# Patient Record
Sex: Female | Born: 1955 | Race: Black or African American | Hispanic: No | Smoking: Current every day smoker
Health system: Southern US, Community
[De-identification: ages and names within clinical notes are randomized; demographics above are authoritative.]

---

## 2019-11-18 ENCOUNTER — Encounter (HOSPITAL_COMMUNITY): Payer: Self-pay | Admitting: Student

## 2019-11-18 ENCOUNTER — Inpatient Hospital Stay (HOSPITAL_COMMUNITY)
Admission: EM | Admit: 2019-11-18 | Discharge: 2019-12-02 | DRG: 020 | Disposition: A | Discharge status: Rehabilitation Facility | Payer: Self-pay | Attending: Neurosurgery | Admitting: Neurosurgery

## 2019-11-18 ENCOUNTER — Emergency Department (HOSPITAL_COMMUNITY): Payer: Self-pay

## 2019-11-18 DIAGNOSIS — Z978 Presence of other specified devices: Secondary | ICD-10-CM

## 2019-11-18 DIAGNOSIS — R471 Dysarthria and anarthria: Secondary | ICD-10-CM | POA: Diagnosis present

## 2019-11-18 DIAGNOSIS — R29810 Facial weakness: Secondary | ICD-10-CM | POA: Diagnosis present

## 2019-11-18 DIAGNOSIS — E874 Mixed disorder of acid-base balance: Secondary | ICD-10-CM | POA: Diagnosis present

## 2019-11-18 DIAGNOSIS — Z2989 Encounter for other specified prophylactic measures: Secondary | ICD-10-CM

## 2019-11-18 DIAGNOSIS — G8194 Hemiplegia, unspecified affecting left nondominant side: Secondary | ICD-10-CM | POA: Diagnosis present

## 2019-11-18 DIAGNOSIS — G936 Cerebral edema: Secondary | ICD-10-CM

## 2019-11-18 DIAGNOSIS — I720 Aneurysm of carotid artery: Secondary | ICD-10-CM | POA: Diagnosis present

## 2019-11-18 DIAGNOSIS — I10 Essential (primary) hypertension: Secondary | ICD-10-CM

## 2019-11-18 DIAGNOSIS — Z9289 Personal history of other medical treatment: Secondary | ICD-10-CM

## 2019-11-18 DIAGNOSIS — R1310 Dysphagia, unspecified: Secondary | ICD-10-CM | POA: Diagnosis present

## 2019-11-18 DIAGNOSIS — J9690 Respiratory failure, unspecified, unspecified whether with hypoxia or hypercapnia: Secondary | ICD-10-CM

## 2019-11-18 DIAGNOSIS — R414 Neurologic neglect syndrome: Secondary | ICD-10-CM | POA: Diagnosis present

## 2019-11-18 DIAGNOSIS — D72829 Elevated white blood cell count, unspecified: Secondary | ICD-10-CM

## 2019-11-18 DIAGNOSIS — G9349 Other encephalopathy: Secondary | ICD-10-CM | POA: Diagnosis present

## 2019-11-18 DIAGNOSIS — Z298 Encounter for other specified prophylactic measures: Secondary | ICD-10-CM

## 2019-11-18 DIAGNOSIS — E785 Hyperlipidemia, unspecified: Secondary | ICD-10-CM | POA: Diagnosis present

## 2019-11-18 DIAGNOSIS — S066XAA Traumatic subarachnoid hemorrhage with loss of consciousness status unknown, initial encounter: Secondary | ICD-10-CM | POA: Diagnosis present

## 2019-11-18 DIAGNOSIS — I609 Nontraumatic subarachnoid hemorrhage, unspecified: Secondary | ICD-10-CM

## 2019-11-18 DIAGNOSIS — I69391 Dysphagia following cerebral infarction: Secondary | ICD-10-CM

## 2019-11-18 DIAGNOSIS — G96198 Other disorders of meninges, not elsewhere classified: Secondary | ICD-10-CM | POA: Diagnosis present

## 2019-11-18 DIAGNOSIS — I69354 Hemiplegia and hemiparesis following cerebral infarction affecting left non-dominant side: Secondary | ICD-10-CM

## 2019-11-18 DIAGNOSIS — E876 Hypokalemia: Secondary | ICD-10-CM

## 2019-11-18 DIAGNOSIS — R569 Unspecified convulsions: Secondary | ICD-10-CM

## 2019-11-18 DIAGNOSIS — D7589 Other specified diseases of blood and blood-forming organs: Secondary | ICD-10-CM

## 2019-11-18 DIAGNOSIS — R0682 Tachypnea, not elsewhere classified: Secondary | ICD-10-CM

## 2019-11-18 DIAGNOSIS — R29734 NIHSS score 34: Secondary | ICD-10-CM | POA: Diagnosis present

## 2019-11-18 DIAGNOSIS — R000 Tachycardia, unspecified: Secondary | ICD-10-CM

## 2019-11-18 DIAGNOSIS — S066X9A Traumatic subarachnoid hemorrhage with loss of consciousness of unspecified duration, initial encounter: Secondary | ICD-10-CM | POA: Diagnosis present

## 2019-11-18 DIAGNOSIS — G9340 Encephalopathy, unspecified: Secondary | ICD-10-CM

## 2019-11-18 DIAGNOSIS — I6001 Nontraumatic subarachnoid hemorrhage from right carotid siphon and bifurcation: Principal | ICD-10-CM | POA: Diagnosis present

## 2019-11-18 DIAGNOSIS — Z20822 Contact with and (suspected) exposure to covid-19: Secondary | ICD-10-CM | POA: Diagnosis present

## 2019-11-18 DIAGNOSIS — Z4659 Encounter for fitting and adjustment of other gastrointestinal appliance and device: Secondary | ICD-10-CM

## 2019-11-18 DIAGNOSIS — R32 Unspecified urinary incontinence: Secondary | ICD-10-CM | POA: Diagnosis present

## 2019-11-18 LAB — APTT: aPTT: 24 seconds (ref 24–36)

## 2019-11-18 LAB — CBC
HCT: 43.3 % (ref 36.0–46.0)
HCT: 43.4 % (ref 36.0–46.0)
Hemoglobin: 14.5 g/dL (ref 12.0–15.0)
Hemoglobin: 14.7 g/dL (ref 12.0–15.0)
MCH: 34.5 pg — ABNORMAL HIGH (ref 26.0–34.0)
MCH: 33.9 pg (ref 26.0–34.0)
MCHC: 33.5 g/dL (ref 30.0–36.0)
MCHC: 33.9 g/dL (ref 30.0–36.0)
MCV: 103.1 fL — ABNORMAL HIGH (ref 80.0–100.0)
MCV: 100.2 fL — ABNORMAL HIGH (ref 80.0–100.0)
Platelets: 236 10*3/uL (ref 150–400)
Platelets: 234 10*3/uL (ref 150–400)
RBC: 4.2 MIL/uL (ref 3.87–5.11)
RBC: 4.33 MIL/uL (ref 3.87–5.11)
RDW: 14.6 % (ref 11.5–15.5)
RDW: 14.5 % (ref 11.5–15.5)
WBC: 11.6 10*3/uL — ABNORMAL HIGH (ref 4.0–10.5)
WBC: 13.4 10*3/uL — ABNORMAL HIGH (ref 4.0–10.5)
nRBC: 0 % (ref 0.0–0.2)
nRBC: 0 % (ref 0.0–0.2)

## 2019-11-18 LAB — BASIC METABOLIC PANEL
Anion gap: 16 — ABNORMAL HIGH (ref 5–15)
BUN: 13 mg/dL (ref 8–23)
CO2: 17 mmol/L — ABNORMAL LOW (ref 22–32)
Calcium: 9.6 mg/dL (ref 8.9–10.3)
Chloride: 107 mmol/L (ref 98–111)
Creatinine, Ser: 0.83 mg/dL (ref 0.44–1.00)
GFR calc Af Amer: >60 mL/min (ref >60)
GFR calc non Af Amer: >60 mL/min (ref >60)
Glucose, Bld: 112 mg/dL — ABNORMAL HIGH (ref 70–99)
Potassium: 4.7 mmol/L (ref 3.5–5.1)
Sodium: 140 mmol/L (ref 135–145)

## 2019-11-18 LAB — POCT I-STAT 7, (LYTES, BLD GAS, ICA,H+H)
Acid-Base Excess: 2 mmol/L (ref 0.0–2.0)
Bicarbonate: 25.4 mmol/L (ref 20.0–28.0)
Calcium, Ion: 1.22 mmol/L (ref 1.15–1.40)
HCT: 43 % (ref 36.0–46.0)
Hemoglobin: 14.6 g/dL (ref 12.0–15.0)
O2 Saturation: 100 %
Patient temperature: 98.6
Potassium: 3.4 mmol/L — ABNORMAL LOW (ref 3.5–5.1)
Sodium: 139 mmol/L (ref 135–145)
TCO2: 26 mmol/L (ref 22–32)
pCO2 arterial: 33.8 mmHg (ref 32.0–48.0)
pH, Arterial: 7.484 — ABNORMAL HIGH (ref 7.350–7.450)
pO2, Arterial: 315 mmHg — ABNORMAL HIGH (ref 83.0–108.0)

## 2019-11-18 LAB — RESPIRATORY PANEL BY RT PCR (FLU A&B, COVID)
Influenza A by PCR: NEGATIVE
Influenza B by PCR: NEGATIVE
SARS Coronavirus 2 by RT PCR: NEGATIVE

## 2019-11-18 LAB — DIFFERENTIAL
Abs Immature Granulocytes: 0.15 K/uL — ABNORMAL HIGH (ref 0.00–0.07)
Basophils Absolute: 0 K/uL (ref 0.0–0.1)
Basophils Relative: 0 %
Eosinophils Absolute: 0 K/uL (ref 0.0–0.5)
Eosinophils Relative: 0 %
Immature Granulocytes: 1 %
Lymphocytes Relative: 9 %
Lymphs Abs: 1.2 K/uL (ref 0.7–4.0)
Monocytes Absolute: 0.7 K/uL (ref 0.1–1.0)
Monocytes Relative: 5 %
Neutro Abs: 11.3 K/uL — ABNORMAL HIGH (ref 1.7–7.7)
Neutrophils Relative %: 85 %

## 2019-11-18 LAB — I-STAT CHEM 8, ED
BUN: 17 mg/dL (ref 8–23)
Calcium, Ion: 1.12 mmol/L — ABNORMAL LOW (ref 1.15–1.40)
Chloride: 109 mmol/L (ref 98–111)
Creatinine, Ser: 0.5 mg/dL (ref 0.44–1.00)
Glucose, Bld: 113 mg/dL — ABNORMAL HIGH (ref 70–99)
HCT: 48 % — ABNORMAL HIGH (ref 36.0–46.0)
Hemoglobin: 16.3 g/dL — ABNORMAL HIGH (ref 12.0–15.0)
Potassium: 4.3 mmol/L (ref 3.5–5.1)
Sodium: 140 mmol/L (ref 135–145)
TCO2: 20 mmol/L — ABNORMAL LOW (ref 22–32)

## 2019-11-18 LAB — PROTIME-INR
INR: 1.1 (ref 0.8–1.2)
Prothrombin Time: 14 seconds (ref 11.4–15.2)

## 2019-11-18 LAB — HIV ANTIBODY (ROUTINE TESTING W REFLEX): HIV Screen 4th Generation wRfx: NONREACTIVE

## 2019-11-18 LAB — CBG MONITORING, ED: Glucose-Capillary: 107 mg/dL — ABNORMAL HIGH (ref 70–99)

## 2019-11-18 LAB — GLUCOSE, CAPILLARY: Glucose-Capillary: 105 mg/dL — ABNORMAL HIGH (ref 70–99)

## 2019-11-18 LAB — ETHANOL: Alcohol, Ethyl (B): <10 mg/dL (ref <10)

## 2019-11-18 LAB — SALICYLATE LEVEL: Salicylate Lvl: <7 mg/dL — ABNORMAL LOW (ref 7.0–30.0)

## 2019-11-18 LAB — ACETAMINOPHEN LEVEL: Acetaminophen (Tylenol), Serum: <10 ug/mL — ABNORMAL LOW (ref 10–30)

## 2019-11-18 MED ORDER — FENTANYL 2500MCG IN NS 250ML (10MCG/ML) PREMIX INFUSION
25.0000 ug/h | INTRAVENOUS | Status: DC
  Administered 2019-11-18: 100 ug/h via INTRAVENOUS
  Filled 2019-11-18: qty 250

## 2019-11-18 MED ORDER — PROPOFOL 1000 MG/100ML IV EMUL
5.0000 ug/kg/min | INTRAVENOUS | Status: DC
  Administered 2019-11-18: 23:00:00 50 ug/kg/min via INTRAVENOUS

## 2019-11-18 MED ORDER — PROPOFOL 1000 MG/100ML IV EMUL
INTRAVENOUS | Status: AC
  Administered 2019-11-18: 50 ug/kg/min via INTRAVENOUS
  Filled 2019-11-18: qty 100

## 2019-11-18 MED ORDER — PANTOPRAZOLE SODIUM 40 MG PO PACK
40.0000 mg | PACK | Freq: Every day | ORAL | Status: DC
  Administered 2019-11-19 – 2019-12-01 (×14): 40 mg
  Filled 2019-11-18 (×15): qty 20

## 2019-11-18 MED ORDER — FENTANYL CITRATE (PF) 100 MCG/2ML IJ SOLN
INTRAMUSCULAR | Status: AC
  Filled 2019-11-18: qty 2

## 2019-11-18 MED ORDER — PROPOFOL 1000 MG/100ML IV EMUL
5.0000 ug/kg/min | INTRAVENOUS | Status: DC
  Administered 2019-11-18 – 2019-11-19 (×2): 50 ug/kg/min via INTRAVENOUS
  Filled 2019-11-18: qty 100

## 2019-11-18 MED ORDER — ACETAMINOPHEN 650 MG RE SUPP: 650.0000 mg | RECTAL | Status: DC | PRN

## 2019-11-18 MED ORDER — LEVETIRACETAM IN NACL 1000 MG/100ML IV SOLN
1000.0000 mg | Freq: Once | INTRAVENOUS | Status: DC
  Filled 2019-11-18: qty 100

## 2019-11-18 MED ORDER — DOCUSATE SODIUM 100 MG PO CAPS: 100.0000 mg | ORAL_CAPSULE | Freq: Two times a day (BID) | ORAL | Status: DC

## 2019-11-18 MED ORDER — ETOMIDATE 2 MG/ML IV SOLN
INTRAVENOUS | Status: AC | PRN
  Administered 2019-11-18: 20 mg via INTRAVENOUS

## 2019-11-18 MED ORDER — FENTANYL BOLUS VIA INFUSION
50.0000 ug | INTRAVENOUS | Status: DC | PRN
  Administered 2019-11-18: 50 ug via INTRAVENOUS
  Filled 2019-11-18: qty 50

## 2019-11-18 MED ORDER — LORAZEPAM 2 MG/ML IJ SOLN
INTRAMUSCULAR | Status: AC
  Filled 2019-11-18: qty 1

## 2019-11-18 MED ORDER — LEVETIRACETAM IN NACL 500 MG/100ML IV SOLN: 500.0000 mg | Freq: Two times a day (BID) | INTRAVENOUS | Status: DC

## 2019-11-18 MED ORDER — CLEVIDIPINE BUTYRATE 0.5 MG/ML IV EMUL
0.0000 mg/h | INTRAVENOUS | Status: DC
  Administered 2019-11-18: 21:00:00 1 mg/h via INTRAVENOUS
  Filled 2019-11-18: qty 50

## 2019-11-18 MED ORDER — ORAL CARE MOUTH RINSE
15.0000 mL | OROMUCOSAL | Status: DC
  Administered 2019-11-18 – 2019-11-21 (×22): 15 mL via OROMUCOSAL

## 2019-11-18 MED ORDER — NIMODIPINE 30 MG PO CAPS
60.0000 mg | ORAL_CAPSULE | ORAL | Status: DC
  Filled 2019-11-18 (×3): qty 2

## 2019-11-18 MED ORDER — ACETAMINOPHEN 325 MG PO TABS
650.0000 mg | ORAL_TABLET | ORAL | Status: DC | PRN
  Filled 2019-11-18: qty 2

## 2019-11-18 MED ORDER — LORAZEPAM 2 MG/ML IJ SOLN
INTRAMUSCULAR | Status: AC
  Administered 2019-11-18: 2 mg
  Filled 2019-11-18: qty 1

## 2019-11-18 MED ORDER — ACETAMINOPHEN 160 MG/5ML PO SOLN
650.0000 mg | ORAL | Status: DC | PRN
  Administered 2019-11-22 – 2019-11-30 (×11): 650 mg
  Filled 2019-11-18 (×12): qty 20.3

## 2019-11-18 MED ORDER — SUCCINYLCHOLINE CHLORIDE 20 MG/ML IJ SOLN
INTRAMUSCULAR | Status: AC | PRN
  Administered 2019-11-18: 100 mg via INTRAVENOUS

## 2019-11-18 MED ORDER — CLEVIDIPINE BUTYRATE 0.5 MG/ML IV EMUL: 0.0000 mg/h | INTRAVENOUS | Status: DC

## 2019-11-18 MED ORDER — ONDANSETRON HCL 4 MG/2ML IJ SOLN
4.0000 mg | Freq: Four times a day (QID) | INTRAMUSCULAR | Status: DC | PRN
  Administered 2019-11-28: 4 mg via INTRAVENOUS
  Filled 2019-11-18: qty 2

## 2019-11-18 MED ORDER — ROCURONIUM BROMIDE 50 MG/5ML IV SOLN
INTRAVENOUS | Status: AC | PRN
  Administered 2019-11-18: 40 mg via INTRAVENOUS

## 2019-11-18 MED ORDER — LEVETIRACETAM IN NACL 1000 MG/100ML IV SOLN
1000.0000 mg | Freq: Two times a day (BID) | INTRAVENOUS | Status: DC
  Administered 2019-11-19 – 2019-11-24 (×12): 1000 mg via INTRAVENOUS
  Filled 2019-11-18 (×13): qty 100

## 2019-11-18 MED ORDER — PANTOPRAZOLE SODIUM 40 MG PO TBEC: 40.0000 mg | DELAYED_RELEASE_TABLET | Freq: Every day | ORAL | Status: DC

## 2019-11-18 MED ORDER — ONDANSETRON 4 MG PO TBDP: 4.0000 mg | ORAL_TABLET | Freq: Four times a day (QID) | ORAL | Status: DC | PRN

## 2019-11-18 MED ORDER — SODIUM CHLORIDE 0.9 % IV SOLN
2000.0000 mg | Freq: Once | INTRAVENOUS | Status: AC
  Administered 2019-11-18: 2000 mg via INTRAVENOUS
  Filled 2019-11-18: qty 20

## 2019-11-18 MED ORDER — STROKE: EARLY STAGES OF RECOVERY BOOK
Freq: Once | Status: AC
  Filled 2019-11-18: qty 1

## 2019-11-18 MED ORDER — CHLORHEXIDINE GLUCONATE CLOTH 2 % EX PADS
6.0000 | MEDICATED_PAD | Freq: Every day | CUTANEOUS | Status: DC
  Administered 2019-11-19 – 2019-12-01 (×11): 6 via TOPICAL

## 2019-11-18 MED ORDER — SODIUM CHLORIDE 0.9 % IV SOLN: INTRAVENOUS | Status: DC

## 2019-11-18 MED ORDER — NIMODIPINE 6 MG/ML PO SOLN
60.0000 mg | ORAL | Status: DC
  Administered 2019-11-19 – 2019-12-01 (×71): 60 mg
  Filled 2019-11-18 (×76): qty 10

## 2019-11-18 MED ORDER — IOHEXOL 350 MG/ML SOLN
75.0000 mL | Freq: Once | INTRAVENOUS | Status: AC | PRN
  Administered 2019-11-18: 75 mL via INTRAVENOUS

## 2019-11-18 MED ORDER — CHLORHEXIDINE GLUCONATE 0.12% ORAL RINSE (MEDLINE KIT)
15.0000 mL | Freq: Two times a day (BID) | OROMUCOSAL | Status: DC
  Administered 2019-11-18 – 2019-11-21 (×6): 15 mL via OROMUCOSAL

## 2019-11-18 NOTE — ED Notes (Signed)
Requested EDP and attending to put in order for propofol

## 2019-11-18 NOTE — ED Provider Notes (Signed)
Medical screening examination/treatment/procedure(s) were conducted as a shared visit with non-physician practitioner(s) and myself.  I personally evaluated the patient during the encounter.      Procedure Name: Intubation Date/Time: 11/18/2019 8:44 PM Performed by: Arby Barrette, MD Pre-anesthesia Checklist: Patient identified, Patient being monitored, Emergency Drugs available and Suction available Oxygen Delivery Method: Ambu bag Preoxygenation: Pre-oxygenation with 100% oxygen Induction Type: Rapid sequence Ventilation: Mask ventilation without difficulty Laryngoscope Size: Glidescope and 3 Grade View: Grade I Tube size: 7.5 mm Number of attempts: 1 Placement Confirmation: ETT inserted through vocal cords under direct vision,  CO2 detector and Breath sounds checked- equal and bilateral Secured at: 23 cm Tube secured with: ETT holder Comments: Uncomplicated intubation under direct visualization.  Oxygen saturation remained 100%.  After the patient being repositioned in the bed and portable x-ray reviewed by myself, ET tube was at 26 cm and portable chest x-ray showed it just at the carina.  Tube withdrawn to 22 cm at the lip.     Angiocath insertion Performed by: Arby Barrette  Consent: Verbal consent obtained. Risks and benefits: risks, benefits and alternatives were discussed Time out: Immediately prior to procedure a "time out" was called to verify the correct patient, procedure, equipment, support staff and site/side marked as required.  Preparation: Patient was prepped and draped in the usual sterile fashion.  Vein Location:left EJ  No Ultrasound Guided  Gauge:20 long  Normal blood return and flush without difficulty Patient tolerance: Patient tolerated the procedure well with no immediate complications.  OROGASTRIC TUBE PLACEMENT: Gastric tube placed by myself.  No resistance.  Confirmed by air installation.   Arby Barrette, MD 11/18/19 2048

## 2019-11-18 NOTE — Progress Notes (Signed)
RN updated husband Mithra Spano, son Nestor Ramp, and daughter Audie Clear on patient's current status. Family states they are from [place], [place]. RN informed family of Dr. Val Riles plan for surgery in the morning and that he will contact them to provide further details. Patient currently stable. RN will continue to monitor.

## 2019-11-18 NOTE — Code Documentation (Signed)
Responded to Code Stroke on pt already in ED. Code stroke called at 1920. Per family, pt c/o headache during the day, laid down for a nap, woke up and had a seizure. Family took pt to fire department and then pt taken to ED via EMS. Pt had another seizure en route and was given versed. Per nursing, pt arrived somnolent but with L sided weakness. Code Stroke was then initiated. Pt became increasingly agitated once in CT and had to be taken back to room and emergently intubated. NIH was unable to be completed prior to intubation. NIH post intubation 34. Pt taken to CT which showed SAH. 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 The aneurysm is 2 to 3 mm proximal to the ICA bifurcation. There is a 2 mm infundibulum or small PCOM aneurysm just proximal to that." Neurosurgery consulted by neurology and pt taken back to room. There, ED RN assisted with starting cleviprex to keep SBP<140, fentanyl gtt started as well, and 2 g keppra given. Plan ICU admit.

## 2019-11-18 NOTE — Progress Notes (Signed)
Admission history and CT/CTA personally reviewed. 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 While the aneurysm may be coilable, I suspect with this morphology clipping will offer better durability of treatment with similar risk profile. Will plan on surgical clipping in am. Pt already evaluated by my partner, Dr. Yetta Barre and to be admitted to ICU overnight. Will d/w family in am prior to surgery.

## 2019-11-18 NOTE — Consult Note (Signed)
NEURO HOSPITALIST CONSULT NOTE   Requestig physician: Dr. Donnald Garre  Reason for Consult: Headache followed by seizures.   History obtained from: EMS and Chart     HPI:                                                                                                                                          Olivia Mann is an 64 y.o. female who presents to the [facility] ED via EMS with seizure. She was traveling with family when she had a seizure lasting approximately 5 minutes. She experienced bladder incontinence with the seizure. LKN per report prior to the was 5:45 PM; however, she had had a headache since 11 AM today. The family stopped at a local fire station where patient was noted to be confused and postictal. EMS was called and during transport, she had a GTC seizure lasting 30-60 seconds. She was given 5 mg IM midazolam. On arrival to the ED, she was somnolent. She was able to maintain 100% saturations on RA in the ED. She was noted to be moving her left side less than her right by staff in the ED. When attempting to obtain CT head, the patient would become agitated whenever she was positioned supine.   Dr. Wilford Corner evaluated the patient and noted the following: "Very drowsy and lethargic. Opens eyes to noxious stimulation. Moves all extremities to noxious stimulation and then becomes severely agitated and starts thrashing around. Pupils are 3 mm round reactive light. No gaze preference.  Oculocephalics present. No focal weakness observed."  Subarachnoid hemorrhage was on the DDx due to headache since 11 AM, followed by the seizures. Benefits of obtaining CT under sedation for emergently essential diagnositic information were determined by Dr. Wilford Corner, Dr. Donnald Garre and myself to outweigh risks.   Per daughter, the patient had been complaining of a "real bad headache". She had had a mild headache yesterday that she an ASA for, just prior to going to bed. On awakening she said she did not  feel well, had a slight headache that then eased up. Had no confusion or drowsiness. Then started traveling in the car with family. She then took an ASA or an Advil and took a nap in the car at about noon. When she got up at 1:30 PM and vomited, then went back to sleep. She woke up again at 5:15 and had the seizure. Family then drove her to the fire department.    PMHx: (obtained from daughter) No past medical history  SocHx: Does not use drugs. Drinks about 2 glasses of wine per day, 6 oz each  FHx: Aneurysms in brother and sister  All: Not on file  Meds No medications per daughter            ROS:  Unable to obtain due to AMS following seizure and benzodiazepine administration.    Blood pressure 135/85, pulse (!) 108, temperature (!) 96.9 F (36.1 C), temperature source Axillary, resp. rate (!) 25, SpO2 100 %.   General Examination:                                                                                                       Physical Exam  HEENT-  [place]/AT. No nuchal rigidity in rotation or flexion/extension   Lungs- Respirations unlabored Extremities- Warm and well perfused  Neurological Examination Mental Status: Obtunded/sedated. Not following any commands. Does not open eyes to stimulation. Nonverbal. Becomes agitated and thrashes all 4 extremities, right more than left, when team attempts to place in supine position.  Cranial Nerves: II: Does not gaze towards or away from visual stimuli.  PERRL 3 mm >> 2 mm.    III,IV, VI: Eyes conjugately at the midline at rest. Intact doll's eye reflex. No nystagmus.  V,VII: Face flaccidly symmetric. Does not react to tactile stimulation. VIII: No responses to auditory stimuli IX,X: Unable to visualize palate XI: Unable to assess SCM or trapezius strength XII: Unable to assess Motor: To noxious,  thrashes RUE and RLE with full strength. Thrashes LUE and LLE but less vigorously than on the right.  Sensory: Reacts to pinch and noxious plantar stimulation in upper and lower extremities bilaterally.  Deep Tendon Reflexes: Deferred in the context of imminent intubation.  Plantars: Equivocal bilaterally  Cerebellar/Gait: Unable to assess   Lab Results: Basic Metabolic Panel: Recent Labs  Lab 11/18/19 1857  NA 140  K 4.3  CL 109  GLUCOSE 113*  BUN 17  CREATININE 0.50    CBC: Recent Labs  Lab 11/18/19 1827 11/18/19 1857  WBC 11.6*  --   HGB 14.5 16.3*  HCT 43.3 48.0*  MCV 103.1*  --   PLT 236  --     Cardiac Enzymes: No results for input(s): CKTOTAL, CKMB, CKMBINDEX, TROPONINI in the last 168 hours.  Lipid Panel: No results for input(s): CHOL, TRIG, HDL, CHOLHDL, VLDL, LDLCALC in the last 168 hours.  Imaging:  CT head: Diffuse subarachnoid hemorrhage. 2.2 x 2.4 x 2.9 cm intraparenchymal hematoma in the right anteromedial temporal lobe. Intraventricular communication on the right. This could be a primary intraparenchymal hemorrhage with subarachnoid and intraventricular penetration or the pattern could also indicate a ruptured aneurysm with an associated intraparenchymal hematoma.   Assessment: 64 year old female presenting with headache and new-onset seizures.  1. Exam after sedation with midazolam reveals obtundation and decreased movement on the left relative to the right.  2. CT head shows extensive supratentorial subarachnoid hemorrhage with an infratentorial component.  3. 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 There is a smaller second right supraclinoid aneurysm 3 mm diameter, slightly more proximally.   Recommendations: 1. STAT Neurosurgery consult. I have discussed the case with Neurosurgery. They will evaluate the patient.  2. BPs in the 120s currently. Ordering a titratable clevidipine drip for use if SBP  goes  above 140.  3. May need triple-H therapy for prevention of vasospasm after clipping or coiling of the aneurysm (Hypertension, Hypervolemia, and Hemodilution). Also may need to start scheduled nimodipine via NGT. Will defer to Neurosurgery.  4. Load Keppra 2000 mg IV x 1 then start 1000 mg IV BID (ordered).   65 minutes spent in the emergent Neurological evaluation and management of this critically ill patient. Time spent included discussion with the family over the telephone, CT review and coordination of care.   Electronically signed: Dr. Caryl Pina 11/18/2019, 7:32 PM

## 2019-11-18 NOTE — ED Notes (Signed)
Removed NRB. Pt maintaining 100% on RA and sleeping

## 2019-11-18 NOTE — ED Provider Notes (Addendum)
[facility] EMERGENCY DEPARTMENT Provider Note   CSN: [id number] Arrival date & time: 11/18/19  1755   History Chief Complaint  Patient presents with   Seizures   Ahmaya Ostermiller is a 64 y.o. female presents with EMS for seizures x 2.  No hx seizures, 2 x seizures given Versed by EMS. Family hx of brain aneurysm.  Daughter states complaints of HA today. No Drug use. No chronic medical problems. Does not take medications on a normal basis per daughter. Family on way.  States she was not complaining of any chest pain, shortness of breath.  Family states she did take some ibuprofen with her headache had one episode of emesis went back to sleep and then had a seizure.  Rowe Clack (Daughter) [phone]     Level 5 Caveat- AMS  HPI     History reviewed. No pertinent past medical history.  Patient Active Problem List   Diagnosis Date Noted   Subarachnoid hematoma (HCC) 11/18/2019    History reviewed   OB History   No obstetric history on file.     History reviewed. No pertinent family history.  Social History   Tobacco Use   Smoking status: Not on file  Substance Use Topics   Alcohol use: Not on file   Drug use: Not on file    Home Medications Prior to Admission medications   Not on File    Allergies    Patient has no known allergies.  Review of Systems   Review of Systems  Unable to perform ROS: Acuity of condition    Physical Exam Updated Vital Signs BP (!) 146/96    Pulse 94    Temp (!) 96.9 F (36.1 C) (Axillary)    Resp 16    SpO2 100%   Physical Exam Constitutional:      Appearance: She is not toxic-appearing or diaphoretic.     Comments: Obtunded  HENT:     Nose: Nose normal.     Mouth/Throat:     Mouth: Mucous membranes are moist.  Eyes:     Comments: 45mm round, slow to react  Neck:     Comments: No rigidity Cardiovascular:     Rate and Rhythm: Tachycardia present.     Pulses: Normal pulses.     Heart sounds:  Normal heart sounds.  Pulmonary:     Breath sounds: Normal breath sounds.  Abdominal:     General: Bowel sounds are normal. There is no distension.     Tenderness: There is no abdominal tenderness.  Musculoskeletal:     Comments: Moves right side freely. No movement to LU, LL extremity.  Shoulders bilaterally appear in socket to palpation.  Skin:    Comments: Brisk cap refill  Neurological:     Mental Status: She is unresponsive.     Comments: Pupils 4 mm, slow to react.  Patient with left-sided neglect. Patient attended, unable to answer questions.  Will withdraw to pain to right side, not to left.    ED Results / Procedures / Treatments   Labs (all labs ordered are listed, but only abnormal results are displayed) Labs Reviewed  BASIC METABOLIC PANEL - Abnormal; Notable for the following components:      Result Value   CO2 17 (*)    Glucose, Bld 112 (*)    Anion gap 16 (*)    All other components within normal limits  CBC - Abnormal; Notable for the following components:   WBC 11.6 (*)  MCV 103.1 (*)    MCH 34.5 (*)    All other components within normal limits  CBG MONITORING, ED - Abnormal; Notable for the following components:   Glucose-Capillary 107 (*)    All other components within normal limits  I-STAT CHEM 8, ED - Abnormal; Notable for the following components:   Glucose, Bld 113 (*)    Calcium, Ion 1.12 (*)    TCO2 20 (*)    Hemoglobin 16.3 (*)    HCT 48.0 (*)    All other components within normal limits  RESPIRATORY PANEL BY RT PCR (FLU A&B, COVID)  PROTIME-INR  APTT  ETHANOL  SALICYLATE LEVEL  ACETAMINOPHEN LEVEL  DIFFERENTIAL  RAPID URINE DRUG SCREEN, HOSP PERFORMED  URINALYSIS, ROUTINE W REFLEX MICROSCOPIC  HIV ANTIBODY (ROUTINE TESTING W REFLEX)  CBC  PROTIME-INR  APTT  BASIC METABOLIC PANEL  I-STAT CHEM 8, ED    EKG None  Radiology CT ANGIO HEAD W OR WO CONTRAST  Result Date: 11/18/2019 CLINICAL DATA:  Intracranial hemorrhage. EXAM: CT  ANGIOGRAPHY HEAD AND NECK TECHNIQUE: Multidetector CT imaging of the head and neck was performed using the standard protocol during bolus administration of intravenous contrast. Multiplanar CT image reconstructions and MIPs were obtained to evaluate the vascular anatomy. Carotid stenosis measurements (when applicable) are obtained utilizing NASCET criteria, using the distal internal carotid diameter as the denominator. CONTRAST:  68mL OMNIPAQUE IOHEXOL 350 MG/ML SOLN COMPARISON:  None. FINDINGS: CTA NECK FINDINGS Aortic arch: Normal Right carotid system: Common carotid artery widely patent to the bifurcation. No bifurcation atherosclerotic disease. Cervical ICA is normal. Left carotid system: Normal Vertebral arteries: Normal Skeleton: Mid cervical spondylosis. Other neck: Normal.  Patient intubated. Upper chest: Normal Review of the MIP images confirms the above findings CTA HEAD FINDINGS Anterior circulation: Both internal carotid arteries are widely patent through the skull base and siphon regions. Projecting laterally from the supraclinoid ICA, 2 mm proximal to the bifurcation, there is a slightly lobular aneurysm measuring up to 7 mm. There appears to be a wide mouth measuring up to 2.5 mm. Just proximal to that, there is a 2 mm aneurysm or infundibulum. No aneurysm seen in the middle cerebral artery branches. Azygos anterior cerebral artery without an aneurysm. Left internal carotid artery is widely patent. No aneurysm on the left. Left middle cerebral artery appears normal. Posterior circulation: Both vertebral arteries are widely patent to the basilar. No basilar stenosis. Posterior circulation branch vessels are normal. No sign of aneurysm. Venous sinuses: Patent and normal. Anatomic variants: Azygos anterior cerebral as noted above. No sign of discernible active extravasation at this moment. 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 The aneurysm is 2 to 3 mm proximal to the ICA bifurcation. 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 Electronically Signed   By: Paulina Fusi M.D.   On: 11/18/2019 20:32   CT ANGIO NECK W OR WO CONTRAST  Result Date: 11/18/2019 CLINICAL DATA:  Intracranial hemorrhage. EXAM: CT ANGIOGRAPHY HEAD AND NECK TECHNIQUE: Multidetector CT imaging of the head and neck was performed using the standard protocol during bolus administration of intravenous contrast. Multiplanar CT image reconstructions and MIPs were obtained to evaluate the vascular anatomy. Carotid stenosis measurements (when applicable) are obtained utilizing NASCET criteria, using the distal internal carotid diameter  as the denominator. CONTRAST:  75mL OMNIPAQUE IOHEXOL 350 MG/ML SOLN COMPARISON:  None. FINDINGS: CTA NECK FINDINGS Aortic arch: Normal Right carotid system: Common carotid artery widely patent to the bifurcation. No bifurcation atherosclerotic disease. Cervical ICA is normal. Left carotid system: Normal Vertebral arteries: Normal Skeleton: Mid cervical spondylosis. Other neck: Normal.  Patient intubated. Upper chest: Normal Review of the MIP images confirms the above findings CTA HEAD FINDINGS Anterior circulation: Both internal carotid arteries are widely patent through the skull base and siphon regions. Projecting laterally from the supraclinoid ICA, 2 mm proximal to the bifurcation, there is a slightly lobular aneurysm measuring up to 7 mm. There appears to be a wide mouth measuring up to 2.5 mm. Just proximal to that, there is a 2 mm aneurysm or infundibulum. No aneurysm seen in the middle cerebral artery branches. Azygos anterior cerebral artery without an aneurysm. Left internal carotid artery is widely patent. No aneurysm on the left. Left middle cerebral artery appears normal. Posterior circulation:  Both vertebral arteries are widely patent to the basilar. No basilar stenosis. Posterior circulation branch vessels are normal. No sign of aneurysm. Venous sinuses: Patent and normal. Anatomic variants: Azygos anterior cerebral as noted above. No sign of discernible active extravasation at this moment. 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 The aneurysm is 2 to 3 mm proximal to the ICA bifurcation. 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 Electronically Signed   By: Paulina FusiMark  Shogry M.D.   On: 11/18/2019 20:32   DG Chest Portable 1 View  Result Date: 11/18/2019 CLINICAL DATA:  Check endotracheal tube placement EXAM: PORTABLE CHEST 1 VIEW COMPARISON:  None. FINDINGS: Cardiac shadows within normal limits. Endotracheal tube is seen 2 cm above the carina. Lungs are well aerated bilaterally. No focal infiltrate or sizable effusion is seen. No bony abnormality is noted. IMPRESSION: No acute abnormality noted. Endotracheal tube in satisfactory position Electronically Signed   By: Alcide CleverMark  Lukens M.D.   On: 11/18/2019 19:48   CT HEAD CODE STROKE WO CONTRAST  Result Date: 11/18/2019 CLINICAL DATA:  Code stroke.  Code stroke.  Seizure.  Confusion. EXAM: CT HEAD WITHOUT CONTRAST TECHNIQUE: Contiguous axial images were obtained from the base of the skull through the vertex without intravenous contrast. COMPARISON:  None. FINDINGS: Brain: There is diffuse subarachnoid hemorrhage. There is a 2.2 x 2.4 x 2.9 cm intraparenchymal hematoma at the right temporal tip region. Question if this represents a primary intraparenchymal hemorrhage with subarachnoid penetration or if there is a ruptured aneurysm with associated intraparenchymal hematoma. Some blood in the right temporal horn and occipital horn of the right lateral  ventricle. No hydrocephalus. Mass effect with right-to-left shift of 3 mm. No evidence of underlying or pre-existing brain lesion. Vascular: No primary vascular finding. Skull: Negative Sinuses/Orbits: Clear/normal Other: None IMPRESSION: Diffuse subarachnoid hemorrhage. 2.2 x 2.4 x 2.9 cm intraparenchymal hematoma in the right anteromedial temporal lobe. Intraventricular communication on the right. This could be a primary intraparenchymal hemorrhage with subarachnoid and intraventricular penetration or the pattern could also indicate a ruptured aneurysm with an associated intraparenchymal hematoma. These results were communicated to Dr. Otelia LimesLindzen at 8:10 pmon 1/2/2021by text page via the [facility] messaging system. Electronically Signed   By: Paulina FusiMark  Shogry M.D.   On: 11/18/2019 20:12  Procedures .Critical Care Performed by: Nettie Elm, PA-C Authorized by: Nettie Elm, PA-C   Critical care provider statement:    Critical care time (minutes):  61   Critical care was necessary to treat or prevent imminent or life-threatening deterioration of the following conditions:  CNS failure or compromise   Critical care was time spent personally by me on the following activities:  Discussions with consultants, evaluation of patient's response to treatment, examination of patient, ordering and performing treatments and interventions, ordering and review of laboratory studies, ordering and review of radiographic studies, pulse oximetry, re-evaluation of patient's condition, obtaining history from patient or surrogate and review of old charts   (including critical care time)  Medications Ordered in ED Medications  LORazepam (ATIVAN) 2 MG/ML injection (has no administration in time range)  fentaNYL 258mcg in NS 282mL (34mcg/ml) infusion-PREMIX (100 mcg/hr Intravenous New Bag/Given 11/18/19 2044)  fentaNYL (SUBLIMAZE) bolus via infusion 50 mcg (50 mcg Intravenous Bolus from Bag 11/18/19 2040)  levETIRAcetam  (KEPPRA) IVPB 1000 mg/100 mL premix (has no administration in time range)  clevidipine (CLEVIPREX) infusion 0.5 mg/mL (1 mg/hr Intravenous New Bag/Given 11/18/19 2035)   stroke: mapping our early stages of recovery book (has no administration in time range)  0.9 %  sodium chloride infusion (has no administration in time range)  acetaminophen (TYLENOL) tablet 650 mg (has no administration in time range)    Or  acetaminophen (TYLENOL) 160 MG/5ML solution 650 mg (has no administration in time range)    Or  acetaminophen (TYLENOL) suppository 650 mg (has no administration in time range)  docusate sodium (COLACE) capsule 100 mg (has no administration in time range)  ondansetron (ZOFRAN-ODT) disintegrating tablet 4 mg (has no administration in time range)    Or  ondansetron (ZOFRAN) injection 4 mg (has no administration in time range)  niMODipine (NIMOTOP) capsule 60 mg (has no administration in time range)    Or  niMODipine (NYMALIZE) 6 MG/ML oral solution 60 mg (has no administration in time range)  pantoprazole (PROTONIX) EC tablet 40 mg (has no administration in time range)    Or  pantoprazole sodium (PROTONIX) 40 mg/20 mL oral suspension 40 mg (has no administration in time range)  levETIRAcetam (KEPPRA) IVPB 1000 mg/100 mL premix (has no administration in time range)  LORazepam (ATIVAN) 2 MG/ML injection (2 mg  Given 11/18/19 1854)  propofol (DIPRIVAN) 1000 MG/100ML infusion (  Bolus 11/18/19 2107)  etomidate (AMIDATE) injection (20 mg Intravenous Given 11/18/19 1922)  succinylcholine (ANECTINE) injection (100 mg Intravenous Given 11/18/19 1923)  rocuronium (ZEMURON) injection (40 mg Intravenous Given 11/18/19 1933)  levETIRAcetam (KEPPRA) 2,000 mg in sodium chloride 0.9 % 250 mL IVPB (0 mg Intravenous Stopped 11/18/19 2059)  iohexol (OMNIPAQUE) 350 MG/ML injection 75 mL (75 mLs Intravenous Contrast Given 11/18/19 2018)    ED Course  I have reviewed the triage vital signs and the nursing  notes.  Pertinent labs & imaging results that were available during my care of the patient were reviewed by me and considered in my medical decision making (see chart for details).  64 year old female presents for evaluation of seizures.  No prior history of seizures.  Seizures x2.  Left-sided neglect.  Family history of subarachnoid hemorrhage.  Concern for hemorrhage versus infarct however higher suspicion for bleed given headache, seizure activity.  Code stroke called.  Labs and imaging personally reviewed and interpreted.  Unfortunately after telling nursing staff to activate code stroke and orders placed in the computer this message  was not relayed to the secretary.  I did personally call neurology when I activated the code stroke.  Attending physician, Dr. Donnald Garre is in to evaluate patient.  Unfortunately patient became combative in CT scanner and CT head was unable to be obtained despite Ativan for sedation.  Patient required intubation, performed by Dr. Donnald Garre in order to obtain imaging.  CT head does show large subarachnoid hemorrhage.  Neurology, Dr. Corinna Capra has consulted with neurosurgery.  Patient will be admitted to neuro ICU.  Possible coiling tomorrow for this critically ill patient.  The patient appears reasonably stabilized for admission considering the current resources, flow, and capabilities available in the ED at this time, and I doubt any other [facility] requiring further screening and/or treatment in the ED prior to admission.  Seen and evaluated by attending, Dr. Donnald Garre who agrees with above treatment, plan and disposition.  Please see her note for intubation as well as line placement.  Daughter Rowe Clack updated on plan for admission and NS consult.    MDM Rules/Calculators/A&P                      Jaliya Siegmann was evaluated in Emergency Department on 11/18/2019 for the symptoms described in the history of present illness. She was evaluated in the context of the global  COVID-19 pandemic, which necessitated consideration that the patient might be at risk for infection with the SARS-CoV-2 virus that causes COVID-19. Institutional protocols and algorithms that pertain to the evaluation of patients at risk for COVID-19 are in a state of rapid change based on information released by regulatory bodies including the CDC and federal and state organizations. These policies and algorithms were followed during the patient's care in the ED. Final Clinical Impression(s) / ED Diagnoses Final diagnoses:  Subarachnoid hemorrhage [facility])    Rx / DC Orders ED Discharge Orders    None       Javien Tesch A, PA-C 11/18/19 2128    Eboney Claybrook A, PA-C 11/18/19 2348    Arby Barrette, MD 11/19/19 1612

## 2019-11-18 NOTE — Plan of Care (Signed)
Code stroke documentation  Received a call from the ED provider at 6:36 PM regarding the patient who presented with seizures and suspected left-sided weakness.  Last known normal provided by them was 5:45 PM.  I recommended a code stroke be activated at the time-around 6:40 PM.  I evaluated the patient: Very drowsy and lethargic. Opens eyes to noxious stimulation Moves all extremities to noxious stimulation and then becomes severely agitated and starts thrashing around. Pupils are 3 mm round reactive light.  No gaze preference.  Oculocephalics present. No focal weakness observed  I took the patient for a stat CT head but the patient was extremely agitated and would not lay still on bed. She was brought back to the emergency room bed 28 for emergent intubation.  I spoke with the patient's daughter-Tia Durenda Age at [phone]. She reports that patient has no past medical history.  Does not do drugs. She has a family history of aneurysms in her brother and sister. She had been complaining of a headache since this morning.  Last known normal 11 AM today per the daughter based on my phone conversation with her.  Delays-significant delay due to patient being extremely agitated and noncooperative for the CT exam.  Had to be brought back to the room for emergent intubation to obtain CT scan.  Care transition to Dr. Otelia Limes, oncoming neuro hospitalist.  Currently being intubated by the ED provider.  Code stroke page not out as of 7:19 PM.  Neurology team at bedside at 6:40 PM.  -- Milon Dikes, MD [facility] Pager: [phone] If 7pm to 7am, please call on call as listed on [facility].

## 2019-11-18 NOTE — H&P (Signed)
Subjective:   Patient is a 64 y.o. female presented to the ED tonight after her family witnessed a seizure. According to family she was complaining of a headache since yesterday. There is a strong history of aneurysms in her family. On admission to the ED she was lethargic but very combative per nursing staff. Intubation was required to complete the CT scan. She is sedated on fentanyl and propofol currently.   History reviewed. No pertinent past medical history.  History reviewed. No pertinent surgical history.  Not on File  Social History   Tobacco Use  . Smoking status: Not on file  Substance Use Topics  . Alcohol use: Not on file    History reviewed. No pertinent family history. Prior to Admission medications   Not on File     Review of Systems  Positive ROS: intubated and sedated  All other systems have been reviewed and were otherwise negative with the exception of those mentioned in the HPI and as above.  Objective: Vital signs in last 24 hours: Temp:  [96.9 F (36.1 C)] 96.9 F (36.1 C) (01/02 1757) Pulse Rate:  [89-115] 94 (01/02 2050) Resp:  [16-25] 16 (01/02 2050) BP: (118-193)/(82-114) 146/96 (01/02 2050) SpO2:  [99 %-100 %] 100 % (01/02 2050)  General Appearance: sedated and lethargic, lying on a gerny Head: Normocephalic, without obvious abnormality, atraumatic Eyes: PERRL, conjunctiva/corneas clear Throat: ETT Lungs: respirations unlabored, ventilator Heart: Regular rate and rhythm Skin: Skin color, texture, turgor normal, no rashes or lesions  NEUROLOGIC:   Mental status: sedated and intubated Motor Exam - MAE to stimuli Sensory Exam - unable to test Reflexes: Coordination - unable to test Gait - unable to test Balance - unable to test Cranial Nerves: I: smell Not tested  II: visual acuity  OS: na    OD: na  II: visual fields uta  II: pupils Equal, round, reactive to light  III,VII: ptosis None  III,IV,VI: extraocular muscles  uta  V:  mastication uta  V: facial light touch sensation  uta  V,VII: corneal reflex  uta  VII: facial muscle function - upper  uta  VII: facial muscle function - lower uta  VIII: hearing uta  IX: soft palate elevation  uta  IX,X: gag reflex uta  XI: trapezius strength  uta  XI: sternocleidomastoid strength uta  XI: neck flexion strength  uta  XII: tongue strength  uta    Data Review Lab Results  Component Value Date   WBC 11.6 (H) 11/18/2019   HGB 16.3 (H) 11/18/2019   HCT 48.0 (H) 11/18/2019   MCV 103.1 (H) 11/18/2019   PLT 236 11/18/2019   Lab Results  Component Value Date   NA 140 11/18/2019   K 4.3 11/18/2019   CL 109 11/18/2019   CO2 17 (L) 11/18/2019   BUN 17 11/18/2019   CREATININE 0.50 11/18/2019   GLUCOSE 113 (H) 11/18/2019   Lab Results  Component Value Date   INR 1.1 11/18/2019     Assessment/Plan: 64 year old female presented to the ED tonight after having a seizure at home witnessed by her family. CT head revealed a right SAH 2.2x2.4x2.9 cm in the temporal lobe with intraventricular hemorrhage. CTA shows a supraclinoid ICA aneurysm. Dr Yetta Barre discussed the case with Dr. Conchita Paris who will plan to coil it tomorrow. Admit to ICU for close monitoring and frequent neuro checks.    Tiana Loft [facility] 11/18/2019 9:17 PM

## 2019-11-18 NOTE — ED Triage Notes (Signed)
[facility] reports pt was traveling with family and had a seizure with urination last approx 5 min. Stopped at local fire station and pt was confused and postictal. [facility] reports a 30 sec to 1 min tonic/clonic in the truck. Pt given 5mg  IM Midazolam and remains sleepy from the medication.

## 2019-11-19 ENCOUNTER — Inpatient Hospital Stay (HOSPITAL_COMMUNITY): Payer: Self-pay | Admitting: Anesthesiology

## 2019-11-19 ENCOUNTER — Other Ambulatory Visit (HOSPITAL_COMMUNITY): Payer: Self-pay

## 2019-11-19 ENCOUNTER — Inpatient Hospital Stay (HOSPITAL_COMMUNITY): Payer: Self-pay

## 2019-11-19 ENCOUNTER — Encounter (HOSPITAL_COMMUNITY)
Admission: EM | Disposition: A | Discharge status: Rehabilitation Facility | Payer: Self-pay | Source: Home / Self Care | Attending: Neurosurgery

## 2019-11-19 DIAGNOSIS — G9340 Encephalopathy, unspecified: Secondary | ICD-10-CM

## 2019-11-19 DIAGNOSIS — D7589 Other specified diseases of blood and blood-forming organs: Secondary | ICD-10-CM

## 2019-11-19 DIAGNOSIS — I609 Nontraumatic subarachnoid hemorrhage, unspecified: Secondary | ICD-10-CM

## 2019-11-19 DIAGNOSIS — Z978 Presence of other specified devices: Secondary | ICD-10-CM

## 2019-11-19 HISTORY — PX: CRANIOTOMY: SHX93

## 2019-11-19 LAB — URINALYSIS, ROUTINE W REFLEX MICROSCOPIC
Bilirubin Urine: NEGATIVE
Glucose, UA: NEGATIVE mg/dL
Hgb urine dipstick: NEGATIVE
Ketones, ur: 20 mg/dL — AB
Leukocytes,Ua: NEGATIVE
Nitrite: NEGATIVE
Protein, ur: NEGATIVE mg/dL
Specific Gravity, Urine: >1.046 — ABNORMAL HIGH (ref 1.005–1.030)
pH: 6 (ref 5.0–8.0)

## 2019-11-19 LAB — CBC WITH DIFFERENTIAL/PLATELET
Abs Immature Granulocytes: 0.05 10*3/uL (ref 0.00–0.07)
Basophils Absolute: 0 10*3/uL (ref 0.0–0.1)
Basophils Relative: 0 %
Eosinophils Absolute: 0 10*3/uL (ref 0.0–0.5)
Eosinophils Relative: 0 %
HCT: 41 % (ref 36.0–46.0)
Hemoglobin: 13.8 g/dL (ref 12.0–15.0)
Immature Granulocytes: 1 %
Lymphocytes Relative: 26 %
Lymphs Abs: 2.1 10*3/uL (ref 0.7–4.0)
MCH: 34.6 pg — ABNORMAL HIGH (ref 26.0–34.0)
MCHC: 33.7 g/dL (ref 30.0–36.0)
MCV: 102.8 fL — ABNORMAL HIGH (ref 80.0–100.0)
Monocytes Absolute: 0.9 10*3/uL (ref 0.1–1.0)
Monocytes Relative: 11 %
Neutro Abs: 5.1 10*3/uL (ref 1.7–7.7)
Neutrophils Relative %: 62 %
Platelets: 201 10*3/uL (ref 150–400)
RBC: 3.99 MIL/uL (ref 3.87–5.11)
RDW: 14.7 % (ref 11.5–15.5)
WBC: 8.2 10*3/uL (ref 4.0–10.5)
nRBC: 0 % (ref 0.0–0.2)

## 2019-11-19 LAB — POCT I-STAT 7, (LYTES, BLD GAS, ICA,H+H)
Acid-base deficit: 1 mmol/L (ref 0.0–2.0)
Acid-base deficit: 4 mmol/L — ABNORMAL HIGH (ref 0.0–2.0)
Bicarbonate: 24.4 mmol/L (ref 20.0–28.0)
Bicarbonate: 25.1 mmol/L (ref 20.0–28.0)
Bicarbonate: 21.4 mmol/L (ref 20.0–28.0)
Calcium, Ion: 1.25 mmol/L (ref 1.15–1.40)
Calcium, Ion: 1.15 mmol/L (ref 1.15–1.40)
Calcium, Ion: 1.12 mmol/L — ABNORMAL LOW (ref 1.15–1.40)
HCT: 40 % (ref 36.0–46.0)
HCT: 35 % — ABNORMAL LOW (ref 36.0–46.0)
HCT: 34 % — ABNORMAL LOW (ref 36.0–46.0)
Hemoglobin: 13.6 g/dL (ref 12.0–15.0)
Hemoglobin: 11.9 g/dL — ABNORMAL LOW (ref 12.0–15.0)
Hemoglobin: 11.6 g/dL — ABNORMAL LOW (ref 12.0–15.0)
O2 Saturation: 99 %
O2 Saturation: 100 %
O2 Saturation: 100 %
Patient temperature: 98.4
Patient temperature: 34
Potassium: 3 mmol/L — ABNORMAL LOW (ref 3.5–5.1)
Potassium: 3.2 mmol/L — ABNORMAL LOW (ref 3.5–5.1)
Potassium: 3.1 mmol/L — ABNORMAL LOW (ref 3.5–5.1)
Sodium: 141 mmol/L (ref 135–145)
Sodium: 141 mmol/L (ref 135–145)
Sodium: 142 mmol/L (ref 135–145)
TCO2: 26 mmol/L (ref 22–32)
TCO2: 26 mmol/L (ref 22–32)
TCO2: 23 mmol/L (ref 22–32)
pCO2 arterial: 42.3 mmHg (ref 32.0–48.0)
pCO2 arterial: 36.7 mmHg (ref 32.0–48.0)
pCO2 arterial: 39.2 mmHg (ref 32.0–48.0)
pH, Arterial: 7.37 (ref 7.350–7.450)
pH, Arterial: 7.43 (ref 7.350–7.450)
pH, Arterial: 7.344 — ABNORMAL LOW (ref 7.350–7.450)
pO2, Arterial: 143 mmHg — ABNORMAL HIGH (ref 83.0–108.0)
pO2, Arterial: 550 mmHg — ABNORMAL HIGH (ref 83.0–108.0)
pO2, Arterial: 227 mmHg — ABNORMAL HIGH (ref 83.0–108.0)

## 2019-11-19 LAB — RAPID URINE DRUG SCREEN, HOSP PERFORMED
Amphetamines: NOT DETECTED
Barbiturates: NOT DETECTED
Benzodiazepines: POSITIVE — AB
Cocaine: NOT DETECTED
Opiates: NOT DETECTED
Tetrahydrocannabinol: POSITIVE — AB

## 2019-11-19 LAB — BASIC METABOLIC PANEL
Anion gap: 13 (ref 5–15)
BUN: 10 mg/dL (ref 8–23)
CO2: 20 mmol/L — ABNORMAL LOW (ref 22–32)
Calcium: 8.9 mg/dL (ref 8.9–10.3)
Chloride: 108 mmol/L (ref 98–111)
Creatinine, Ser: 0.54 mg/dL (ref 0.44–1.00)
GFR calc Af Amer: >60 mL/min (ref >60)
GFR calc non Af Amer: >60 mL/min (ref >60)
Glucose, Bld: 73 mg/dL (ref 70–99)
Potassium: 3.4 mmol/L — ABNORMAL LOW (ref 3.5–5.1)
Sodium: 141 mmol/L (ref 135–145)

## 2019-11-19 LAB — PROTIME-INR
INR: 1 (ref 0.8–1.2)
Prothrombin Time: 13 seconds (ref 11.4–15.2)

## 2019-11-19 LAB — SURGICAL PCR SCREEN
MRSA, PCR: NEGATIVE
Staphylococcus aureus: NEGATIVE

## 2019-11-19 LAB — APTT: aPTT: 29 seconds (ref 24–36)

## 2019-11-19 LAB — MAGNESIUM: Magnesium: 2.1 mg/dL (ref 1.7–2.4)

## 2019-11-19 LAB — PHOSPHORUS: Phosphorus: 3.9 mg/dL (ref 2.5–4.6)

## 2019-11-19 LAB — PREPARE RBC (CROSSMATCH)

## 2019-11-19 LAB — TRIGLYCERIDES: Triglycerides: 74 mg/dL (ref <150)

## 2019-11-19 LAB — ABO/RH: ABO/RH(D): AB POS

## 2019-11-19 SURGERY — CRANIOTOMY INTRACRANIAL ANEURYSM FOR CAROTID
Anesthesia: General | Site: Head | Laterality: Right

## 2019-11-19 MED ORDER — PROPOFOL 10 MG/ML IV BOLUS
INTRAVENOUS | Status: AC
  Filled 2019-11-19: qty 20

## 2019-11-19 MED ORDER — MANNITOL 20 % IV SOLN
100.0000 g | Status: DC
  Filled 2019-11-19: qty 500

## 2019-11-19 MED ORDER — LIDOCAINE-EPINEPHRINE 1 %-1:100000 IJ SOLN
INTRAMUSCULAR | Status: AC
  Filled 2019-11-19: qty 1

## 2019-11-19 MED ORDER — BACITRACIN ZINC 500 UNIT/GM EX OINT
TOPICAL_OINTMENT | CUTANEOUS | Status: AC
  Filled 2019-11-19: qty 28.35

## 2019-11-19 MED ORDER — ROCURONIUM BROMIDE 50 MG/5ML IV SOSY
PREFILLED_SYRINGE | INTRAVENOUS | Status: DC | PRN
  Administered 2019-11-19 (×2): 50 mg via INTRAVENOUS
  Administered 2019-11-19: 100 mg via INTRAVENOUS

## 2019-11-19 MED ORDER — FENTANYL CITRATE (PF) 250 MCG/5ML IJ SOLN
INTRAMUSCULAR | Status: AC
  Filled 2019-11-19: qty 5

## 2019-11-19 MED ORDER — THROMBIN 20000 UNITS EX SOLR
CUTANEOUS | Status: DC | PRN
  Administered 2019-11-19: 20 mL via TOPICAL

## 2019-11-19 MED ORDER — SODIUM CHLORIDE 0.9 % IV SOLN: INTRAVENOUS | Status: DC | PRN

## 2019-11-19 MED ORDER — DEXAMETHASONE SODIUM PHOSPHATE 10 MG/ML IJ SOLN
INTRAMUSCULAR | Status: DC | PRN
  Administered 2019-11-19: 10 mg via INTRAVENOUS

## 2019-11-19 MED ORDER — MICROFIBRILLAR COLL HEMOSTAT EX PADS
MEDICATED_PAD | CUTANEOUS | Status: DC | PRN
  Administered 2019-11-19: 1 via TOPICAL

## 2019-11-19 MED ORDER — SODIUM CHLORIDE 0.9 % IV SOLN
INTRAVENOUS | Status: DC | PRN
  Administered 2019-11-19: 250 mL via INTRAVENOUS

## 2019-11-19 MED ORDER — SODIUM CHLORIDE 0.9 % IV SOLN
INTRAVENOUS | Status: DC | PRN
  Administered 2019-11-19: 500 mL

## 2019-11-19 MED ORDER — BUPIVACAINE HCL (PF) 0.5 % IJ SOLN
INTRAMUSCULAR | Status: AC
  Filled 2019-11-19: qty 30

## 2019-11-19 MED ORDER — POTASSIUM CHLORIDE 10 MEQ/100ML IV SOLN
INTRAVENOUS | Status: DC | PRN
  Administered 2019-11-19 (×2): 10 meq via INTRAVENOUS

## 2019-11-19 MED ORDER — LIDOCAINE HCL 0.5 % IJ SOLN
INTRAMUSCULAR | Status: DC | PRN
  Administered 2019-11-19: 4.5 mL via INTRADERMAL

## 2019-11-19 MED ORDER — THROMBIN 20000 UNITS EX SOLR
CUTANEOUS | Status: AC
  Filled 2019-11-19: qty 20000

## 2019-11-19 MED ORDER — LIDOCAINE HCL (PF) 0.5 % IJ SOLN
INTRAMUSCULAR | Status: AC
  Filled 2019-11-19: qty 50

## 2019-11-19 MED ORDER — ARTIFICIAL TEARS OPHTHALMIC OINT
TOPICAL_OINTMENT | OPHTHALMIC | Status: DC | PRN
  Administered 2019-11-19: 1 via OPHTHALMIC

## 2019-11-19 MED ORDER — ONDANSETRON HCL 4 MG/2ML IJ SOLN
INTRAMUSCULAR | Status: AC
  Filled 2019-11-19: qty 2

## 2019-11-19 MED ORDER — PROPOFOL 1000 MG/100ML IV EMUL
0.0000 ug/kg/min | INTRAVENOUS | Status: DC
  Administered 2019-11-19: 40 ug/kg/min via INTRAVENOUS
  Administered 2019-11-20: 01:00:00 20 ug/kg/min via INTRAVENOUS
  Filled 2019-11-19 (×2): qty 100

## 2019-11-19 MED ORDER — PHENYLEPHRINE HCL-NACL 10-0.9 MG/250ML-% IV SOLN
INTRAVENOUS | Status: DC | PRN
  Administered 2019-11-19: 25 ug/min via INTRAVENOUS

## 2019-11-19 MED ORDER — EPHEDRINE 5 MG/ML INJ
INTRAVENOUS | Status: AC
  Filled 2019-11-19: qty 10

## 2019-11-19 MED ORDER — FENTANYL 2500MCG IN NS 250ML (10MCG/ML) PREMIX INFUSION
50.0000 ug/h | INTRAVENOUS | Status: DC
  Administered 2019-11-19: 175 ug/h via INTRAVENOUS
  Filled 2019-11-19: qty 250

## 2019-11-19 MED ORDER — MANNITOL 20 % IV SOLN
50.0000 g | Status: AC
  Filled 2019-11-19: qty 250

## 2019-11-19 MED ORDER — 0.9 % SODIUM CHLORIDE (POUR BTL) OPTIME
TOPICAL | Status: DC | PRN
  Administered 2019-11-19 (×2): 1000 mL

## 2019-11-19 MED ORDER — PHENYLEPHRINE 40 MCG/ML (10ML) SYRINGE FOR IV PUSH (FOR BLOOD PRESSURE SUPPORT)
PREFILLED_SYRINGE | INTRAVENOUS | Status: AC
  Filled 2019-11-19: qty 10

## 2019-11-19 MED ORDER — MIDAZOLAM HCL 2 MG/2ML IJ SOLN
INTRAMUSCULAR | Status: AC
  Filled 2019-11-19: qty 2

## 2019-11-19 MED ORDER — CEFAZOLIN SODIUM-DEXTROSE 2-3 GM-%(50ML) IV SOLR
INTRAVENOUS | Status: DC | PRN
  Administered 2019-11-19: 2 g via INTRAVENOUS

## 2019-11-19 MED ORDER — INDOCYANINE GREEN 25 MG IV SOLR
5.0000 mg | Freq: Once | INTRAVENOUS | Status: AC
  Administered 2019-11-19: 12.5 mg via INTRAVENOUS
  Filled 2019-11-19: qty 25

## 2019-11-19 MED ORDER — DEXAMETHASONE SODIUM PHOSPHATE 10 MG/ML IJ SOLN
INTRAMUSCULAR | Status: AC
  Filled 2019-11-19: qty 1

## 2019-11-19 MED ORDER — POTASSIUM CHLORIDE 10 MEQ/100ML IV SOLN
10.0000 meq | INTRAVENOUS | Status: AC
  Administered 2019-11-19 (×3): 10 meq via INTRAVENOUS
  Filled 2019-11-19 (×3): qty 100

## 2019-11-19 MED ORDER — THROMBIN 5000 UNITS EX SOLR
OROMUCOSAL | Status: DC | PRN
  Administered 2019-11-19: 10:00:00 5 mL via TOPICAL

## 2019-11-19 MED ORDER — BACITRACIN ZINC 500 UNIT/GM EX OINT
TOPICAL_OINTMENT | CUTANEOUS | Status: DC | PRN
  Administered 2019-11-19: 1 via TOPICAL

## 2019-11-19 MED ORDER — BUPIVACAINE HCL 0.5 % IJ SOLN
INTRAMUSCULAR | Status: DC | PRN
  Administered 2019-11-19: 4.5 mL

## 2019-11-19 MED ORDER — FENTANYL CITRATE (PF) 100 MCG/2ML IJ SOLN: 50.0000 ug | Freq: Once | INTRAMUSCULAR | Status: DC

## 2019-11-19 MED ORDER — PHENYLEPHRINE HCL (PRESSORS) 10 MG/ML IV SOLN
INTRAVENOUS | Status: DC | PRN
  Administered 2019-11-19: 80 ug via INTRAVENOUS

## 2019-11-19 MED ORDER — FENTANYL BOLUS VIA INFUSION
50.0000 ug | INTRAVENOUS | Status: DC | PRN
  Filled 2019-11-19: qty 50

## 2019-11-19 MED ORDER — ROCURONIUM BROMIDE 10 MG/ML (PF) SYRINGE
PREFILLED_SYRINGE | INTRAVENOUS | Status: AC
  Filled 2019-11-19: qty 20

## 2019-11-19 MED ORDER — MIDAZOLAM HCL 5 MG/5ML IJ SOLN
INTRAMUSCULAR | Status: DC | PRN
  Administered 2019-11-19: 2 mg via INTRAVENOUS

## 2019-11-19 MED ORDER — THROMBIN 5000 UNITS EX SOLR
CUTANEOUS | Status: AC
  Filled 2019-11-19: qty 5000

## 2019-11-19 MED ORDER — FENTANYL CITRATE (PF) 100 MCG/2ML IJ SOLN
INTRAMUSCULAR | Status: DC | PRN
  Administered 2019-11-19: 100 ug via INTRAVENOUS
  Administered 2019-11-19: 150 ug via INTRAVENOUS

## 2019-11-19 SURGICAL SUPPLY — 97 items
BAG DECANTER FOR FLEXI CONT (MISCELLANEOUS) ×2 IMPLANT
BAND RUBBER #18 3X1/16 STRL (MISCELLANEOUS) ×6 IMPLANT
BATTERY IQ STERILE (MISCELLANEOUS) ×2 IMPLANT
BENZOIN TINCTURE PRP APPL 2/3 (GAUZE/BANDAGES/DRESSINGS) IMPLANT
BIT DRILL WIRE PASS 1.3MM (BIT) IMPLANT
BLADE SAW GIGLI 16 STRL (MISCELLANEOUS) IMPLANT
BLADE SURG 15 STRL LF DISP TIS (BLADE) ×1 IMPLANT
BLADE SURG 15 STRL SS (BLADE) ×1
BNDG GAUZE ELAST 4 BULKY (GAUZE/BANDAGES/DRESSINGS) ×4 IMPLANT
BNDG STRETCH 4X75 NS LF (GAUZE/BANDAGES/DRESSINGS) ×2 IMPLANT
BNDG STRETCH 4X75 STRL LF (GAUZE/BANDAGES/DRESSINGS) IMPLANT
BUR ACORN 6.0 PRECISION (BURR) ×2 IMPLANT
BUR MATCHSTICK NEURO 3.0 LAGG (BURR) IMPLANT
BUR ROUND FLUTED 4 SOFT TCH (BURR) IMPLANT
BUR ROUND FLUTED 5 RND (BURR) ×2 IMPLANT
BUR SPIRAL ROUTER 2.3 (BUR) ×2 IMPLANT
CANISTER SUCT 3000ML PPV (MISCELLANEOUS) ×2 IMPLANT
CARTRIDGE OIL MAESTRO DRILL (MISCELLANEOUS) ×1 IMPLANT
CLIP ANEURY TI PERM STD 8.3 (Clip) ×2 IMPLANT
CLIP VESOCCLUDE MED 6/CT (CLIP) IMPLANT
COVER MAYO STAND STRL (DRAPES) IMPLANT
COVER WAND RF STERILE (DRAPES) ×2 IMPLANT
DECANTER SPIKE VIAL GLASS SM (MISCELLANEOUS) ×2 IMPLANT
DIFFUSER DRILL AIR PNEUMATIC (MISCELLANEOUS) ×2 IMPLANT
DRAPE MICROSCOPE LEICA (MISCELLANEOUS) ×2 IMPLANT
DRAPE NEUROLOGICAL W/INCISE (DRAPES) ×2 IMPLANT
DRAPE WARM FLUID 44X44 (DRAPES) ×2 IMPLANT
DRILL WIRE PASS 1.3MM (BIT)
DRSG ADAPTIC 3X8 NADH LF (GAUZE/BANDAGES/DRESSINGS) ×2 IMPLANT
DRSG TELFA 3X8 NADH (GAUZE/BANDAGES/DRESSINGS) ×2 IMPLANT
DURAMATRIX ONLAY 3X3 (Plate) ×2 IMPLANT
DURAPREP 6ML APPLICATOR 50/CS (WOUND CARE) ×2 IMPLANT
ELECT REM PT RETURN 9FT ADLT (ELECTROSURGICAL) ×2
ELECTRODE REM PT RTRN 9FT ADLT (ELECTROSURGICAL) ×1 IMPLANT
EVACUATOR SILICONE 100CC (DRAIN) IMPLANT
FORCEPS BIPOLAR SPETZLER 8 1.0 (NEUROSURGERY SUPPLIES) ×2 IMPLANT
GAUZE 4X4 16PLY RFD (DISPOSABLE) IMPLANT
GAUZE SPONGE 4X4 12PLY STRL (GAUZE/BANDAGES/DRESSINGS) IMPLANT
GLOVE BIO SURGEON STRL SZ 6.5 (GLOVE) ×4 IMPLANT
GLOVE BIO SURGEON STRL SZ7 (GLOVE) ×6 IMPLANT
GLOVE BIO SURGEON STRL SZ7.5 (GLOVE) IMPLANT
GLOVE BIO SURGEON STRL SZ8 (GLOVE) ×2 IMPLANT
GLOVE BIOGEL PI IND STRL 7.5 (GLOVE) ×2 IMPLANT
GLOVE BIOGEL PI INDICATOR 7.5 (GLOVE) ×2
GLOVE ECLIPSE 7.0 STRL STRAW (GLOVE) ×4 IMPLANT
GLOVE INDICATOR 8.5 STRL (GLOVE) ×2 IMPLANT
GOWN STRL REUS W/ TWL LRG LVL3 (GOWN DISPOSABLE) ×3 IMPLANT
GOWN STRL REUS W/ TWL XL LVL3 (GOWN DISPOSABLE) ×1 IMPLANT
GOWN STRL REUS W/TWL 2XL LVL3 (GOWN DISPOSABLE) IMPLANT
GOWN STRL REUS W/TWL LRG LVL3 (GOWN DISPOSABLE) ×3
GOWN STRL REUS W/TWL XL LVL3 (GOWN DISPOSABLE) ×1
HEMOSTAT POWDER KIT SURGIFOAM (HEMOSTASIS) IMPLANT
HEMOSTAT SURGICEL 2X14 (HEMOSTASIS) ×2 IMPLANT
HOOK DURA (MISCELLANEOUS) ×2 IMPLANT
HOOK DURA 1/2IN (MISCELLANEOUS) ×2 IMPLANT
KIT BASIN OR (CUSTOM PROCEDURE TRAY) ×2 IMPLANT
KIT DRAIN CSF ACCUDRAIN (MISCELLANEOUS) IMPLANT
KIT TURNOVER KIT B (KITS) ×2 IMPLANT
KNIFE ARACHNOID DISP AM-24-S (MISCELLANEOUS) ×2 IMPLANT
KNIFE ARACHNOID DISP AM-24-SB (BLADE) ×2 IMPLANT
NEEDLE HYPO 22GX1.5 SAFETY (NEEDLE) IMPLANT
NEEDLE HYPO 25X1 1.5 SAFETY (NEEDLE) ×2 IMPLANT
NEEDLE SPNL 25GX3.5 QUINCKE BL (NEEDLE) IMPLANT
NS IRRIG 1000ML POUR BTL (IV SOLUTION) ×2 IMPLANT
OIL CARTRIDGE MAESTRO DRILL (MISCELLANEOUS) ×2
PACK CRANIOTOMY CUSTOM (CUSTOM PROCEDURE TRAY) ×2 IMPLANT
PAD ARMBOARD 7.5X6 YLW CONV (MISCELLANEOUS) ×2 IMPLANT
PATTIES SURGICAL .25X.25 (GAUZE/BANDAGES/DRESSINGS) IMPLANT
PATTIES SURGICAL .5 X.5 (GAUZE/BANDAGES/DRESSINGS) IMPLANT
PATTIES SURGICAL .5 X3 (DISPOSABLE) IMPLANT
PATTIES SURGICAL 1/4 X 3 (GAUZE/BANDAGES/DRESSINGS) IMPLANT
PATTIES SURGICAL 1X1 (DISPOSABLE) IMPLANT
PERFORATOR LRG  14-11MM (BIT)
PERFORATOR LRG 14-11MM (BIT) IMPLANT
PIN MAYFIELD SKULL DISP (PIN) ×2 IMPLANT
PLATE 1.5 4HOLE LONG STRAIGHT (Plate) ×2 IMPLANT
PLATE 1.5/0.5 18.5MM BURR HOLE (Plate) ×4 IMPLANT
SCREW SELF DRILL HT 1.5/4MM (Screw) ×22 IMPLANT
SPONGE NEURO XRAY DETECT 1X3 (DISPOSABLE) IMPLANT
SPONGE SURGIFOAM ABS GEL 100 (HEMOSTASIS) ×2 IMPLANT
STAPLER VISISTAT 35W (STAPLE) ×2 IMPLANT
STOCKINETTE 6  STRL (DRAPES) ×1
STOCKINETTE 6 STRL (DRAPES) ×1 IMPLANT
SUT ETHILON 3 0 FSL (SUTURE) IMPLANT
SUT NURALON 4 0 TR CR/8 (SUTURE) ×4 IMPLANT
SUT VIC AB 0 CT1 18XCR BRD8 (SUTURE) ×2 IMPLANT
SUT VIC AB 0 CT1 8-18 (SUTURE) ×2
SUT VIC AB 3-0 SH 8-18 (SUTURE) ×2 IMPLANT
TAPE CLOTH 1X10 TAN NS (GAUZE/BANDAGES/DRESSINGS) ×2 IMPLANT
TIP NONSTICK .5MMX23CM (INSTRUMENTS)
TIP NONSTICK .5X23 (INSTRUMENTS) IMPLANT
TOWEL GREEN STERILE (TOWEL DISPOSABLE) ×2 IMPLANT
TOWEL GREEN STERILE FF (TOWEL DISPOSABLE) ×2 IMPLANT
TRAY FOLEY MTR SLVR 16FR STAT (SET/KITS/TRAYS/PACK) IMPLANT
TUBE CONNECTING 20X1/4 (TUBING) ×2 IMPLANT
UNDERPAD 30X30 (UNDERPADS AND DIAPERS) IMPLANT
WATER STERILE IRR 1000ML POUR (IV SOLUTION) ×2 IMPLANT

## 2019-11-19 NOTE — Progress Notes (Signed)
SLP Cancellation Note  Patient Details Name: Carroll Ranney MRN: [id number] DOB: [birth year]   Cancelled treatment:       Reason Eval/Treat Not Completed: Patient not medically ready   Rachelann Enloe, Riley Nearing 11/19/2019, 7:38 AM

## 2019-11-19 NOTE — Progress Notes (Signed)
Patient belongings included a blanket, pair of white pants, silver ring with white colored stone, gold ring with diamond like stones, gold necklace with charm, gold and silver xo necklace, gold necklace with red stone, yellow bangle bracelet.

## 2019-11-19 NOTE — Progress Notes (Signed)
eLink Physician-Brief Progress Note Patient Name: Olivia Mann DOB: 11-25-1955 MRN: [id number]   Date of Service  11/19/2019  HPI/Events of Note  ABG on 40%(?)/PRVC 18/TV 450/P 5 = 7.48/33.8/315/25.4. PRVC rate decreased to 15 by RT. Portable CXR reveals ETT tip 2 cm from carina. Patient sedated with Fentanyl and Propofol IV infusions.   eICU Interventions  Will order: 1. Ventilator settings: 40%/PRVC 15/TV 450/P 5. 2. Repeat ABG at 5 AM.     Intervention Category Major Interventions: Respiratory failure - evaluation and management  Zaara Sprowl [place] 11/19/2019, 12:08 AM

## 2019-11-19 NOTE — Anesthesia Procedure Notes (Signed)
Date/Time: 11/19/2019 9:00 AM Performed by: Carmela Rima, CRNA Comments: Ambu bag connected to Existing 7.5 ETT.  Hand ventilated to OR.

## 2019-11-19 NOTE — Transfer of Care (Signed)
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

## 2019-11-19 NOTE — Anesthesia Postprocedure Evaluation (Signed)
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)     Patient location during evaluation: ICU Anesthesia Type: General Level of consciousness: sedated and patient remains intubated per anesthesia plan Pain management: pain level controlled Vital Signs Assessment: post-procedure vital signs reviewed and stable Respiratory status: patient remains intubated per anesthesia plan Cardiovascular status: stable Postop Assessment: no apparent nausea or vomiting Anesthetic complications: no    Last Vitals:  Vitals:   11/19/19 1330 11/19/19 1357  BP:    Pulse: (!) 54   Resp: 12   Temp:  (!) 34.7 C  SpO2: 100%     Last Pain:  Vitals:   11/19/19 0800  TempSrc: Axillary  PainSc:                  Beryle Lathe

## 2019-11-19 NOTE — Op Note (Signed)
NEUROSURGERY OPERATIVE NOTE   PREOP DIAGNOSIS:  1. Subarachnoid hemorrhage  POSTOP DIAGNOSIS: Same  PROCEDURE: 1. Right frontotemporal craniotomy for clipping of internal carotid artery aneurysm, simple 2. Intraoperative ICG video angiography 3. Use of intraoperative microscope for microdissection 4. Evacuation of right temporal hematoma  SURGEON: Dr. Consuella Lose, MD  ASSISTANT: Dr. Kary Kos, MD  ANESTHESIA: General Endotracheal  EBL: 150 cc  SPECIMENS: None  DRAINS: None  COMPLICATIONS: None immediate  CONDITION: Hemodynamically stable to intensive care unit  HISTORY: Olivia Mann is a 64 y.o. female initially presenting to the emergency department after a witnessed seizure at home.  She was obtunded in the emergency department complaining of significant headache.  CT scan demonstrated diffuse basal subarachnoid hemorrhage suggestive of aneurysm, with a small right temporal clot.  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  The risks and benefits of the surgery were reviewed in detail with the patient's husband.  After all questions were answered informed consent was obtained and witnessed.  PROCEDURE IN DETAIL: The patient was brought to the operating room. After induction of general anesthesia, the patient was positioned on the operative table in the [place] head holder in the supine position. All pressure points were meticulously padded.  Standard right sided curvilinear frontotemporal skin incision was then marked out and prepped and draped in the usual sterile fashion.  After timeout was conducted, the incision was infiltrated with local anesthetic without epinephrine.  Incision was then made sharply and carried down through the galea.  Raney clips were applied for hemostasis on the skin edges.  Bovie electrocautery was used to incise the temporalis muscle  and fascia.  A single piece myocutaneous flap was then elevated and reflected anteriorly.  Bur holes were then created on the pterion, along the superior temporal line, and just above the root of the zygoma.  These were connected with the craniotome and a single piece frontotemporal flap was elevated.  The dura was noted to be significantly adherent to the undersurface of the bone and was opened during removal of the craniotomy flap.  Hemostasis on the epidural plane was secured with bipolar electrocautery.  The high-speed drill was then used to drill down the lesser wing of the sphenoid until the evening orbital ligament was identified.  At this point the microscope was draped sterilely and brought into the field and the remainder of the case was done under the microscope using microdissection technique.  The dura was opened in curvilinear fashion and tacked up with 4 Nurolon stitches.  Subfrontal approach was then used to identify the right olfactory nerve and the right optic nerve.  Arachnoid overlying the optic nerve was then opened and dissection was carried laterally to identify the proximal portion of the supraclinoid internal carotid artery.  Good CSF egress was obtained for brain relaxation.  The proximal portion of the sylvian fissure was then dissected, and the supraclinoid ICA was dissected distally until the ICA bifurcation was identified.  The neck of the aneurysm was then easily identified projecting laterally from the internal carotid artery.  At this point, the proximal and distal portions of the neck were dissected using blunt dissectors and sharp dissector.  A standard sized curved titanium clip was then selected and placed across the neck of the aneurysm.  Once the aneurysm was clipped, further dissection along the internal carotid artery was carried out as well as further dissection along  the dome of the aneurysm projecting into the medial temporal lobe.  The proximal and distal blades of the  aneurysm clip were secured against the aneurysm neck.  I was not able to identify the anterior choroidal artery within the aneurysm clip.  At this point, the anesthesia service was asked to administer a 12.5 mg dose of indocyanine green.  Fluorescence angiography was used to confirm patency of the proximal and distal internal carotid artery.  There was no filling of the aneurysm dome.  At this point the wound was irrigated with copious amounts of normal saline irrigation.  Good hemostasis was secured on the peel surface using a combination of bipolar electrocautery and morselized Gelfoam and thrombin.  At this point we turned attention to removal of the temporal hematoma.  A small corticectomy was made along the superior temporal gyrus near the temporal pole.  Immediately underneath the cortex we identified the hematoma which was evacuated easily using suction and blunt dissectors.  Once the hematoma was evacuated I was able to identify white matter along the hematoma cavity indicating good evacuation and good decompression.  Hemostasis was again secured in the hematoma cavity with a combination of morselized Gelfoam with thrombin.  The wound was again irrigated.  A layer of collagen matrix onlay graft was placed over the dural surface.  The bone flap was then replaced and plated with standard titanium plates and screws.  The temporalis muscle was approximated with interrupted 0 Vicryl stitches, and the galea was closed with interrupted 3-0 Vicryl stitches.  Skin staples were used for skin closure.  Bacitracin ointment and sterile dressings were then applied.  The patient was then removed from the [place] head holder and transferred the stretcher.  She was taken to the intensive care unit intubated in stable hemodynamic condition.  At the end of the case all sponge, needle, instrument, and cottonoid counts were correct.

## 2019-11-19 NOTE — Progress Notes (Signed)
  NEUROSURGERY PROGRESS NOTE   No issues overnight.   EXAM:  BP 107/75   Pulse 64   Temp 98.4 F (36.9 C) (Oral)   Resp 12   Ht 5\' 1"  (1.549 m)   Wt 53.9 kg   SpO2 100%   BMI 22.45 kg/m   Remains intubated, sedated on propofol.  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 I believe clip ligation is better alternative given relatively accessible location and bilobed morphology.  PLAN: - Proceed with right FT crani for clipping this am  I have reviewed the imaging findings with the patient's husband. We discussed treatment options including my recommendation for surgical clipping. Risks of the procedure including but not limited to stroke and/or hemorrhage were discussed. All his questions were answered and he provided verbal consent over the phone to proceed.

## 2019-11-19 NOTE — Progress Notes (Signed)
RN spoke to patient youngest daughter Crist Infante and updated. Per daughter, patient does not have any prior medical history, does not take any medications and does not drink alcohol. RN will continue to monitor.

## 2019-11-19 NOTE — Progress Notes (Addendum)
Diamond ring from pt's finger given to this Charity fundraiser from [employer], research (medical).  Will give to designated visitor to take home when she arrives.  Per OR RN, foley bag broke and replaced, seal not intact on arrival to ICU.

## 2019-11-19 NOTE — Progress Notes (Signed)
..   NAME:  Olivia Mann, MRN:  [id number], DOB:  January 19, 1956, LOS: 1 ADMISSION DATE:  11/18/2019, CONSULTATION DATE:  11/19/2019 REFERRING MD:  FIEPPIRJJ MD, CHIEF COMPLAINT:  Seizure   Brief History   64 yr old F w/ no sig PMHx presents with new onset GTC seizures preceded by headache. Found to have Diffuse Subarachnoid hemorrhage measuring 2.2 x 2.4 x 2.9 cm w/ intraparenchymal hematoma in the right anteromedial temporal lobe. CTA demonstrated a 7 mm supraclinoid ICA aneurysm w/ 13mm bilobed right Pcom aneurysm. Neurosurgery managing. Patient for OR in AM. Intubated on sedation and BP control. PCCM consulted for assistance in ventilator management.  History of present illness   ( Pt is sedated on propofol and intubated History was obtained from EMR review and acct from other providers)  64 yr old F presented to [facility] on 11/18/2019 via EMS after a witnessed seizure w/ bladder incontinence per the patient's family.  On 11/18/2019 at 11 AM pt complained for a headache  After the initial seizure event the family took the patient to a local fire station. During transport with EMS patient had a subsequent generalized tonic clonic that lasted between 30-60 seconds.  On arrival to [facility] she was noted to be drowsy, lethargic, and responded to noxious stimuli with eye opening, and extremity movement. No focal weakness was noted. Pt was evaluated by Neurology and sent for imaging. CT head w/o contrast demonstrated extensive supratentorial subarachnoid hemorrhage with an infratentorial component.  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 There is a smaller second right supraclinoid aneurysm 3 mm diameter, slightly more proximally. Neurosurgery was consulted. Pt was started on Clevidipine for goal BP 120 or less Patient was loaded with AED Keppra 2g IV x 1 and started on maintenance 1g IV BID. Neurosurgery admitted patient to ICU overnight with plans for OR in AM PCCM consulted  for vent management.  Past Medical History  .Marland Kitchen Active Ambulatory Problems    Diagnosis Date Noted  . No Active Ambulatory Problems   Resolved Ambulatory Problems    Diagnosis Date Noted  . No Resolved Ambulatory Problems   No Additional Past Medical History   Social Hx: wine 2 glasses/day Fam Hx significant for Aneurysms (brother and sister)  Significant Hospital Events   SAH on imaging Endotracheally intubated   Consults:  Neurology- 11/18/2019 PCCM- 11/19/2019  Procedures:  For Neurosurgery in AM  Significant Diagnostic Tests:  CT Angio Head 11/18/2019 Anterior circulation: Both internal carotid arteries are widely patent through the skull base and siphon regions. Projecting laterally from the supraclinoid ICA, 2 mm proximal to the bifurcation, there is a slightly lobular aneurysm measuring up to 7 mm. There appears to be a wide mouth measuring up to 2.5 mm. Just proximal to that, there is a 2 mm aneurysm or infundibulum. No aneurysm seen in the middle cerebral artery branches. Azygos anterior cerebral artery without an aneurysm. Left internal carotid artery is widely patent. No aneurysm on the left. Left middle cerebral artery appears normal. Posterior circulation: Both vertebral arteries are widely patent to the basilar. No basilar stenosis. Posterior circulation branch vessels are normal. No sign of aneurysm. Venous sinuses: Patent and normal. Anatomic variants: Azygos anterior cerebral as noted above. No sign of discernible active extravasation at this moment. 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 The aneurysm is 2 to 3 mm proximal to the ICA bifurcation. 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  CXR 11/18/2019 FINDINGS: Cardiac shadows within normal  limits. Endotracheal tube is seen 2 cm above the carina. Lungs are well aerated bilaterally. No focal infiltrate or sizable effusion is seen. No bony abnormality is noted. IMPRESSION: No acute abnormality noted. Endotracheal tube in satisfactory Position  DG ABD 11/18/2019 IMPRESSION: Tip and side port of the enteric tube below the diaphragm in the stomach.  Micro Data:  11/18/2019  SARSCOV2 negative  Influenza A and B negative  Antimicrobials:  none   Objective   Blood pressure 110/74, pulse 63, temperature (!) 96.2 F (35.7 C), temperature source Axillary, resp. rate 12, height 5\' 1"  (1.549 m), weight 53.9 kg, SpO2 100 %.    Vent Mode: PRVC FiO2 (%):  [40 %-100 %] 40 % Set Rate:  [12 bmp-18 bmp] 12 bmp Vt Set:  [450 mL] 450 mL PEEP:  [5 cmH20] 5 cmH20 Plateau Pressure:  [10 cmH20-16 cmH20] 16 cmH20   Intake/Output Summary (Last 24 hours) at 11/19/2019 0933 Last data filed at 11/19/2019 0800 Gross per 24 hour  Intake 1594.3 ml  Output 600 ml  Net 994.3 ml   Filed Weights   11/18/19 2303  Weight: 53.9 kg    Examination: General: Sedated on full mechanical ventilatory support HEENT: MM pink/moist endotracheal tube orogastric tube in place Neuro: Moves spontaneously currently on propofol drip CV: Heart sounds are regular regular rate rhythm PULM: Managed in the bases Vent pressure regulated volume control FIO2 40% PEEP 5 RATE 12+4 VT 450 cc  GI: soft, bsx4 active  GU: Extremities: warm/dry,  edema  Skin: no rashes or lesions   Assessment & Plan:  1. Generalized Tonic Clonic Seizures and Headache secondary to  Subarachnoid Hemorrhage:  measuring 2.2 x 2.4 x 2.9 cm w/ intraparenchymal hematoma in the right anteromedial temporal lobe. 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 In neurology for continuation of blood pressure control managing Plan: Continue using Cleviprex for blood pressure goal 12/06/1938   2. Acute  Respiratory Insufficiency in the setting of Acute Encephalopathy  Endotracheally intubated on PRVC ABG: 7.48/33/315/25 Shows Respiratory and Metabolic Alkalosis Plan: Vent bundle Will look to guidance from neurosurgery as to weaning protocol    Acute Encephalopathy  Secondary to [facility] Pt was noted to be thrashing during initial evaluation and agitated Requiring increasing sedation and intubation Plan: Going to the operating room 11/19/2019 for clipping Pain sedation per protocol    Macrocytosis without Anemia MCV 103 on admission ETOH <10 on admission Given recent [place] Reticulocytosis in response to bleeding can explain increased MCV Recent Labs    11/19/19 0510 11/19/19 0734  HGB 13.6 13.8    Plan: Transfuse per protocol   Anion Gap Metabolic Acidosis  On admission Glucose, ETOH and salicylate wnl No LA on admission Ph 7.48 on most recent ABG Plan: Monitor and replete electrolytes   Best practice:  Diet: NPO Pain/Anxiety/Delirium protocol (if indicated): Propofol and Fentanyl  VAP protocol (if indicated): yes  DVT prophylaxis: SCDs alone GI prophylaxis: Protonix via OGT Glucose control: no h/o DM if BG exceeds 180 mg/dl will start ISS Mobility: Bedrest Code Status: Full Family Communication: Per primary Disposition: ICU  Labs   CBC: Recent Labs  Lab 11/18/19 1827 11/18/19 1857 11/18/19 2135 11/18/19 2218 11/19/19 0510 11/19/19  0734  WBC 11.6*  --  13.4*  --   --  8.2  NEUTROABS  --   --  11.3*  --   --  5.1  HGB 14.5 16.3* 14.7 14.6 13.6 13.8  HCT 43.3 48.0* 43.4 43.0 40.0 41.0  MCV 103.1*  --  100.2*  --   --  102.8*  PLT 236  --  234  --   --  201    Basic Metabolic Panel: Recent Labs  Lab 11/18/19 1827 11/18/19 1857 11/18/19 2218 11/19/19 0510 11/19/19 0734  NA 140 140 139 141 141  K 4.7 4.3 3.4* 3.0* 3.4*  CL 107 109  --   --  108  CO2 17*  --   --   --  20*  GLUCOSE 112* 113*  --   --  73  BUN 13 17  --   --  10  CREATININE 0.83  0.50  --   --  0.54  CALCIUM 9.6  --   --   --  8.9  MG  --   --   --   --  2.1  PHOS  --   --   --   --  3.9   GFR: Estimated Creatinine Clearance: 55 mL/min (by C-G formula based on SCr of 0.54 mg/dL). Recent Labs  Lab 11/18/19 1827 11/18/19 2135 11/19/19 0734  WBC 11.6* 13.4* 8.2    Liver Function Tests: No results for input(s): AST, ALT, ALKPHOS, BILITOT, PROT, ALBUMIN in the last 168 hours. No results for input(s): LIPASE, AMYLASE in the last 168 hours. No results for input(s): AMMONIA in the last 168 hours.  ABG    Component Value Date/Time   PHART 7.370 11/19/2019 0510   PCO2ART 42.3 11/19/2019 0510   PO2ART 143.0 (H) 11/19/2019 0510   HCO3 24.4 11/19/2019 0510   TCO2 26 11/19/2019 0510   ACIDBASEDEF 1.0 11/19/2019 0510   O2SAT 99.0 11/19/2019 0510     Coagulation Profile: Recent Labs  Lab 11/18/19 1827 11/18/19 2320  INR 1.1 1.0    Cardiac Enzymes: No results for input(s): CKTOTAL, CKMB, CKMBINDEX, TROPONINI in the last 168 hours.  HbA1C: No results found for: HGBA1C  CBG: Recent Labs  Lab 11/18/19 1843 11/18/19 2318  GLUCAP 107* 105*     CC TIME: 30 minutes CODE STATUS: Full DISPOSITION: ICU PROGNOSIS: Olivia Mann ACNP Acute Care Nurse Practitioner [facility] Pulmonary/Critical Care Please consult Amion 11/19/2019, 9:33 AM

## 2019-11-19 NOTE — Anesthesia Procedure Notes (Signed)
Arterial Line Insertion Start/End1/01/2020 9:30 AM, 11/19/2019 9:40 AM Performed by: Sheppard Evens, CRNA, CRNA  Preanesthetic checklist: patient identified, IV checked and monitors and equipment checked Left, radial was placed Catheter size: 20 G Hand hygiene performed  and maximum sterile barriers used   Attempts: 3 Procedure performed without using ultrasound guided technique. Following insertion, dressing applied and Biopatch. Post procedure assessment: normal and unchanged  Patient tolerated the procedure well with no immediate complications.

## 2019-11-19 NOTE — Consult Note (Signed)
..   NAME:  Olivia Mann, MRN:  [id number], DOB:  July 31, 1956, LOS: 1 ADMISSION DATE:  11/18/2019, CONSULTATION DATE:  11/19/2019 REFERRING MD:  [id number] MD, CHIEF COMPLAINT:  Seizure   Brief History   64 yr old F w/ no sig PMHx presents with new onset GTC seizures preceded by headache. Found to have Diffuse Subarachnoid hemorrhage measuring 2.2 x 2.4 x 2.9 cm w/ intraparenchymal hematoma in the right anteromedial temporal lobe. 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 Neurosurgery managing. Patient for OR in AM. Intubated on sedation and BP control. PCCM consulted for assistance in ventilator management.  History of present illness   ( Pt is sedated on propofol and intubated History was obtained from EMR review and acct from other providers)  64 yr old F presented to [facility] on 11/18/2019 via EMS after a witnessed seizure w/ bladder incontinence per the patient's family.  On 11/18/2019 at 11 AM pt complained for a headache  After the initial seizure event the family took the patient to a local fire station. During transport with EMS patient had a subsequent generalized tonic clonic that lasted between 30-60 seconds.  On arrival to [facility] she was noted to be drowsy, lethargic, and responded to noxious stimuli with eye opening, and extremity movement. No focal weakness was noted. Pt was evaluated by Neurology and sent for imaging. CT head w/o contrast demonstrated extensive supratentorial subarachnoid hemorrhage with an infratentorial component.  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 There is a smaller second right supraclinoid aneurysm 3 mm diameter, slightly more proximally. Neurosurgery was consulted. Pt was started on Clevidipine for goal BP 120 or less Patient was loaded with AED Keppra 2g IV x 1 and started on maintenance 1g IV BID. Neurosurgery admitted patient to ICU overnight with plans for OR in AM PCCM consulted  for vent management.  Past Medical History  .Olivia Mann Active Ambulatory Problems    Diagnosis Date Noted  . No Active Ambulatory Problems   Resolved Ambulatory Problems    Diagnosis Date Noted  . No Resolved Ambulatory Problems   No Additional Past Medical History   Social Hx: wine 2 glasses/day Fam Hx significant for Aneurysms (brother and sister)  Significant Hospital Events   SAH on imaging Endotracheally intubated   Consults:  Neurology- 11/18/2019 PCCM- 11/19/2019  Procedures:  For Neurosurgery in AM  Significant Diagnostic Tests:  CT Angio Head 11/18/2019 Anterior circulation: Both internal carotid arteries are widely patent through the skull base and siphon regions. Projecting laterally from the supraclinoid ICA, 2 mm proximal to the bifurcation, there is a slightly lobular aneurysm measuring up to 7 mm. There appears to be a wide mouth measuring up to 2.5 mm. Just proximal to that, there is a 2 mm aneurysm or infundibulum. No aneurysm seen in the middle cerebral artery branches. Azygos anterior cerebral artery without an aneurysm. Left internal carotid artery is widely patent. No aneurysm on the left. Left middle cerebral artery appears normal. Posterior circulation: Both vertebral arteries are widely patent to the basilar. No basilar stenosis. Posterior circulation branch vessels are normal. No sign of aneurysm. Venous sinuses: Patent and normal. Anatomic variants: Azygos anterior cerebral as noted above. No sign of discernible active extravasation at this moment. 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 The aneurysm is 2 to 3 mm proximal to the ICA bifurcation. 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  CXR 11/18/2019 FINDINGS: Cardiac shadows within normal  limits. Endotracheal tube is seen 2 cm above the carina. Lungs are well aerated bilaterally. No focal infiltrate or sizable effusion is seen. No bony abnormality is noted. IMPRESSION: No acute abnormality noted. Endotracheal tube in satisfactory Position  DG ABD 11/18/2019 IMPRESSION: Tip and side port of the enteric tube below the diaphragm in the stomach.  Micro Data:  11/18/2019  SARSCOV2 negative  Influenza A and B negative  Antimicrobials:  none   Objective   Blood pressure (!) 132/95, pulse 87, temperature 98.2 F (36.8 C), temperature source Oral, resp. rate 15, height 5\' 1"  (1.549 m), weight 53.9 kg, SpO2 100 %.    FiO2 (%):  [40 %] 40 %   Intake/Output Summary (Last 24 hours) at 11/19/2019 0016 Last data filed at 11/18/2019 2059 Gross per 24 hour  Intake 270 ml  Output --  Net 270 ml   Filed Weights   11/18/19 2303  Weight: 53.9 kg    Examination: General: intubated and sedated HENT: ETT in oropharynx. Equal round and reactive. Unable to assess EOM no nystagmus Lungs: bilateral breath sounds no wheezing or rhonci appreciated Cardiovascular: S1 and S2 appreciated in reg rate and rhythm Abdomen: soft non distended non tender abdomen Extremities: SCDs on lower extremities, no appreciable edema Neuro: sedated on propofol at time of evaluation GU: no indwelling foley catheter Skin: grossly intact dry    Assessment & Plan:  1. Generalized Tonic Clonic Seizures and Headache secondary to  Subarachnoid Hemorrhage:  measuring 2.2 x 2.4 x 2.9 cm w/ intraparenchymal hematoma in the right anteromedial temporal lobe. 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 Neurosurgery managing Plan: Continue on Cleviprex for BP control Goal 120-176mmHg OR in AM for clipping given the morphology of patient's aneurysms Keep pt sedated with Propofol  2. Acute Respiratory Insufficiency in the setting of Acute Encephalopathy  Endotracheally  intubated on PRVC ABG: 7.48/33/315/25 Shows Respiratory and Metabolic Alkalosis Plan: Continue on Full Vent support PRVC TV 8 cc/kg decrease RR to 12 (initially 18) FiO2 40 and Peep 5 Bronchial Hygiene and VAPrecautions HOB elevated >30 deg  No SBT at this time>> re evaluate post OR and per primary Neurosurgery  3. Acute Encephalopathy  Secondary to [facility] Pt was noted to be thrashing during initial evaluation and agitated Requiring increasing sedation and intubation Plan: Continue on Pain and sedation protocol w/ Propofol and Fentanyl RASS goal -1 to -2  4. Macrocytosis without Anemia MCV 103 on admission ETOH <10 on admission Given recent [place] Reticulocytosis in response to bleeding can explain increased MCV Plan: F/u next CBC Monitor for drop in Hgb  Transfuse if Hgb <7 with PRBCs.  5. Anion Gap Metabolic Acidosis  On admission Glucose, ETOH and salicylate wnl No LA on admission Ph 7.48 on most recent ABG Plan: Most likely has resolved given recent ph Repeat BMP, Mg and phos in AM Replete electrolytes as needed   Best practice:  Diet: NPO Pain/Anxiety/Delirium protocol (if indicated): Propofol and Fentanyl  VAP protocol (if indicated): yes  DVT prophylaxis: SCDs alone GI prophylaxis: Protonix via OGT Glucose control: no h/o DM if BG exceeds 180 mg/dl will start ISS Mobility: Bedrest Code Status: Full Family Communication: Husband Jazmine Heckman, Daughter Tia and  Son Glean Hess from [place]  Disposition: ICU  Labs   CBC: Recent Labs  Lab 11/18/19 1827 11/18/19 1857 11/18/19 2135 11/18/19 2218  WBC 11.6*  --  13.4*  --   NEUTROABS  --   --  11.3*  --   HGB 14.5 16.3* 14.7 14.6  HCT 43.3 48.0* 43.4 43.0  MCV 103.1*  --  100.2*  --   PLT 236  --  234  --     Basic Metabolic Panel: Recent Labs  Lab 11/18/19 1827 11/18/19 1857 11/18/19 2218  NA 140 140 139  K 4.7 4.3 3.4*  CL 107 109  --   CO2 17*  --   --   GLUCOSE 112* 113*  --   BUN 13 17  --     CREATININE 0.83 0.50  --   CALCIUM 9.6  --   --    GFR: Estimated Creatinine Clearance: 55 mL/min (by C-G formula based on SCr of 0.5 mg/dL). Recent Labs  Lab 11/18/19 1827 11/18/19 2135  WBC 11.6* 13.4*    Liver Function Tests: No results for input(s): AST, ALT, ALKPHOS, BILITOT, PROT, ALBUMIN in the last 168 hours. No results for input(s): LIPASE, AMYLASE in the last 168 hours. No results for input(s): AMMONIA in the last 168 hours.  ABG    Component Value Date/Time   PHART 7.484 (H) 11/18/2019 2218   PCO2ART 33.8 11/18/2019 2218   PO2ART 315.0 (H) 11/18/2019 2218   HCO3 25.4 11/18/2019 2218   TCO2 26 11/18/2019 2218   O2SAT 100.0 11/18/2019 2218     Coagulation Profile: Recent Labs  Lab 11/18/19 1827 11/18/19 2320  INR 1.1 1.0    Cardiac Enzymes: No results for input(s): CKTOTAL, CKMB, CKMBINDEX, TROPONINI in the last 168 hours.  HbA1C: No results found for: HGBA1C  CBG: Recent Labs  Lab 11/18/19 1843 11/18/19 2318  GLUCAP 107* 105*    Review of Systems:   Olivia KitchenMarland KitchenReview of Systems  Unable to perform ROS: Intubated    Past Medical History  She,  has no past medical history on file.   Surgical History   History reviewed. No pertinent surgical history.   Social History      Family History   Her family history is not on file.   Allergies No Known Allergies   Home Medications  Prior to Admission medications   Not on File    STAFF NOTE  I, Dr Newell Coral have personally reviewed patient's available data, including medical history, events of note, physical examination and test results as part of my evaluation. I have discussed with other care providers such as pharmacist, RN and Elink.   The patient is critically ill with multiple organ systems failure and requires high complexity decision making for assessment and support, frequent evaluation and titration of therapies, application of advanced monitoring technologies and extensive  interpretation of multiple databases.   Critical Care Time devoted to patient care services described in this note is 48 Minutes. This time reflects time of care of this signee Dr Newell Coral. This critical care time does not reflect procedure time, or teaching time or supervisory time but could involve care discussion time   CC TIME: 45 minutes CODE STATUS: Full DISPOSITION: ICU PROGNOSIS: Guarded  Dr. Newell Coral Pulmonary Critical Care Medicine  11/19/2019 1:40 AM  Critical care time: 45 mins

## 2019-11-19 NOTE — Anesthesia Preprocedure Evaluation (Addendum)
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 The aneurysm is 2 to 3 mm proximal to the ICA bifurcation. 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  negative psych ROS   GI/Hepatic negative GI ROS, Neg liver ROS,   Endo/Other   Hypokalemia   Renal/GU negative Renal ROS     Musculoskeletal negative musculoskeletal ROS (+)   Abdominal   Peds  Hematology negative hematology ROS (+)   Anesthesia Other Findings   Reproductive/Obstetrics                            Anesthesia Physical Anesthesia Plan  ASA: III  Anesthesia Plan: General   Post-op Pain Management:    Induction: Inhalational  PONV Risk Score and Plan: 3 and Treatment may vary due to age or medical condition  Airway Management Planned: Oral ETT  Additional Equipment: Arterial line  Intra-op Plan:   Post-operative Plan: Post-operative intubation/ventilation  Informed Consent:     History available from chart only  Plan Discussed with: CRNA and Anesthesiologist  Anesthesia Plan Comments:        Anesthesia Quick Evaluation

## 2019-11-19 NOTE — Progress Notes (Signed)
STROKE TEAM PROGRESS NOTE   HISTORY OF PRESENT ILLNESS (per record) Olivia Mann is an 64 y.o. female who presents to the [facility] ED via EMS with seizure. She was traveling with family when she had a seizure lasting approximately 5 minutes. She experienced bladder incontinence with the seizure. LKN per report prior to the was 5:45 PM; however, she had had a headache since 11 AM today. The family stopped at a local fire station where patient was noted to be confused and postictal. EMS was called and during transport, she had a GTC seizure lasting 30-60 seconds. She was given 5 mg IM midazolam. On arrival to the ED, she was somnolent. She was able to maintain 100% saturations on RA in the ED. She was noted to be moving her left side less than her right by staff in the ED. When attempting to obtain CT head, the patient would become agitated whenever she was positioned supine.   Dr. Wilford Corner evaluated the patient and noted the following: "Very drowsy and lethargic. Opens eyes to noxious stimulation. Moves all extremities to noxious stimulation and then becomes severely agitated and starts thrashing around. Pupils are 3 mm round reactive light. No gaze preference. Oculocephalics present. No focal weakness observed."  Subarachnoid hemorrhage was on the DDx due to headache since 11 AM, followed by the seizures. Benefits of obtaining CT under sedation for emergently essential diagnositic information were determined by Dr. Wilford Corner, Dr. Donnald Garre and myself to outweigh risks.   Per daughter, the patient had been complaining of a "real bad headache". She had had a mild headache yesterday that she an ASA for, just prior to going to bed. On awakening she said she did not feel well, had a slight headache that then eased up. Had no confusion or drowsiness. Then started traveling in the car with family. She then took an ASA or an Advil and took a nap in the car at about noon. When she got up at 1:30 PM and vomited, then went back  to sleep. She woke up again at 5:15 and had the seizure. Family then drove her to the fire department.     INTERVAL HISTORY Patient in the OR for aneurysm clipping.    OBJECTIVE Vitals:   11/19/19 0300 11/19/19 0400 11/19/19 0436 11/19/19 0500  BP: 99/68   98/71  Pulse: 65 65 66 65  Resp: 12 12 12 12   Temp:  98.4 F (36.9 C)    TempSrc:  Oral    SpO2: 100% 100% 100% 100%  Weight:      Height:        CBC:  Recent Labs  Lab 11/18/19 1827 11/18/19 2135 11/18/19 2218 11/19/19 0510  WBC 11.6* 13.4*  --   --   NEUTROABS  --  11.3*  --   --   HGB 14.5 14.7 14.6 13.6  HCT 43.3 43.4 43.0 40.0  MCV 103.1* 100.2*  --   --   PLT 236 234  --   --     Basic Metabolic Panel:  Recent Labs  Lab 11/18/19 1827 11/18/19 1857 11/18/19 2218 11/19/19 0510  NA 140 140 139 141  K 4.7 4.3 3.4* 3.0*  CL 107 109  --   --   CO2 17*  --   --   --   GLUCOSE 112* 113*  --   --   BUN 13 17  --   --   CREATININE 0.83 0.50  --   --   CALCIUM  9.6  --   --   --     Lipid Panel: No results found for: CHOL, TRIG, HDL, CHOLHDL, VLDL, LDLCALC HgbA1c: No results found for: HGBA1C Urine Drug Screen:     Component Value Date/Time   LABOPIA NONE DETECTED 11/19/2019 0028   COCAINSCRNUR NONE DETECTED 11/19/2019 0028   LABBENZ POSITIVE (A) 11/19/2019 0028   AMPHETMU NONE DETECTED 11/19/2019 0028   THCU POSITIVE (A) 11/19/2019 0028   LABBARB NONE DETECTED 11/19/2019 0028    Alcohol Level     Component Value Date/Time   ETH <10 11/18/2019 2135    IMAGING  CT ANGIO HEAD W OR WO CONTRAST CT ANGIO NECK W OR WO CONTRAST 11/18/2019 IMPRESSION:  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 The aneurysm is 2 to 3 mm proximal to the ICA bifurcation. 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   DG Chest Portable 1 View 11/18/2019 IMPRESSION:  No acute abnormality  noted. Endotracheal tube in satisfactory position   DG Abd Portable 1V 11/19/2019 IMPRESSION:  Tip and side port of the enteric tube below the diaphragm in the stomach.   CT HEAD CODE STROKE WO CONTRAST  11/18/2019  IMPRESSION:  Diffuse subarachnoid hemorrhage. 2.2 x 2.4 x 2.9 cm intraparenchymal hematoma in the right anteromedial temporal lobe. Intraventricular communication on the right. This could be a primary intraparenchymal hemorrhage with subarachnoid and intraventricular penetration or the pattern could also indicate a ruptured aneurysm with an associated intraparenchymal hematoma.  Transcranial Dopplers - pending  ECG - pending  EEG - not ordered   PHYSICAL EXAM Blood pressure 98/71, pulse 65, temperature 98.4 F (36.9 C), temperature source Oral, resp. rate 12, height 5\' 1"  (1.549 m), weight 53.9 kg, SpO2 100 %.   I was not able to see patient, Patient in the OR for aneurysm clipping. Stroke will see her on Monday.    ASSESSMENT/PLAN [name] is a 64 y.o. female with no significant PMHX presenting with headache, new onset seizures, agitation, and AMS. She was found to have a SAH as well as an aneurysm that may have ruptured.  She did not receive IV t-PA due to [facility].  Stroke:  Diffuse subarachnoid hemorrhage. 2.2 x 2.4 x 2.9 cm intraparenchymal hematoma in the right anteromedial temporal lobe   Code Stroke CT Head - Diffuse subarachnoid hemorrhage. 2.2 x 2.4 x 2.9 cm intraparenchymal hematoma in the right anteromedial temporal lobe. Intraventricular communication on the right.  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 The aneurysm is 2 to 3 mm proximal to the ICA bifurcation. 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   Transcranial Dopplers -  pending  CT Perfusion - not ordered  Carotid Doppler - CTA neck performed - carotid dopplers not indicated.  2D Echo - not indicated  [employer] Virus 2 - negative  LDL - will order - pending  HgbA1c - will order - pending  UDS - THCU and benzodiazepines  VTE prophylaxis - SCDs Diet  Diet Order  Diet NPO time specified  Diet effective now              No antithrombotic prior to admission, now on No antithrombotic  Ongoing aggressive stroke risk factor management  Therapy recommendations:  pending  Disposition:  Pending  Hypertension  Home BP meds: none   Current BP meds: Cleviprex  SBP - mildly low . Long-term BP goal normotensive  Hyperlipidemia  Home Lipid lowering medication: none   LDL - pending, goal < 70  Current lipid lowering medication: none   Continue statin at discharge  Other Stroke Risk Factors  Advanced age  Cigarette smoker - not on file  ETOH use - not on file   Family hx of aneurysms  Substance Abuse - THCU  Other Active Problems  Seizures - Keppra  SAH / aneurysm - surgery to be discussed with family - Nimodipine  Hypokalemia - 4.3->3.4->3.0 - supplement and recheck  Leukocytosis - 11.6->13.4 (afebrile)  Macrocytic - check B12 and folate  Intubated / sedated  Hospital day # 1  I was not able to see patient, Patient in the OR for aneurysm clipping. Stroke will see her on Monday.  Personally examined images, and have participated in and made any corrections needed to history, assessment and plan as stated above.  I have personally obtained the history, evaluated lab date, reviewed imaging studies and agree with radiology interpretations.    Naomie Dean, MD Stroke Neurology   A total of 15 minutes was spent for the care of this patient   To contact Stroke Continuity provider, please refer to [email or web address]. After hours, contact General Neurology

## 2019-11-20 ENCOUNTER — Inpatient Hospital Stay (HOSPITAL_COMMUNITY): Payer: Self-pay

## 2019-11-20 ENCOUNTER — Encounter: Payer: Self-pay | Admitting: *Deleted

## 2019-11-20 DIAGNOSIS — J9601 Acute respiratory failure with hypoxia: Secondary | ICD-10-CM

## 2019-11-20 DIAGNOSIS — R569 Unspecified convulsions: Secondary | ICD-10-CM

## 2019-11-20 DIAGNOSIS — G936 Cerebral edema: Secondary | ICD-10-CM

## 2019-11-20 DIAGNOSIS — I609 Nontraumatic subarachnoid hemorrhage, unspecified: Secondary | ICD-10-CM

## 2019-11-20 LAB — CBC WITH DIFFERENTIAL/PLATELET
Abs Immature Granulocytes: 0.04 10*3/uL (ref 0.00–0.07)
Basophils Absolute: 0 10*3/uL (ref 0.0–0.1)
Basophils Relative: 0 %
Eosinophils Absolute: 0 10*3/uL (ref 0.0–0.5)
Eosinophils Relative: 0 %
HCT: 33.5 % — ABNORMAL LOW (ref 36.0–46.0)
Hemoglobin: 11.2 g/dL — ABNORMAL LOW (ref 12.0–15.0)
Immature Granulocytes: 0 %
Lymphocytes Relative: 10 %
Lymphs Abs: 1.1 10*3/uL (ref 0.7–4.0)
MCH: 34.8 pg — ABNORMAL HIGH (ref 26.0–34.0)
MCHC: 33.4 g/dL (ref 30.0–36.0)
MCV: 104 fL — ABNORMAL HIGH (ref 80.0–100.0)
Monocytes Absolute: 0.9 10*3/uL (ref 0.1–1.0)
Monocytes Relative: 9 %
Neutro Abs: 8.9 10*3/uL — ABNORMAL HIGH (ref 1.7–7.7)
Neutrophils Relative %: 81 %
Platelets: 174 10*3/uL (ref 150–400)
RBC: 3.22 MIL/uL — ABNORMAL LOW (ref 3.87–5.11)
RDW: 15.2 % (ref 11.5–15.5)
WBC: 10.9 10*3/uL — ABNORMAL HIGH (ref 4.0–10.5)
nRBC: 0 % (ref 0.0–0.2)

## 2019-11-20 LAB — PHOSPHORUS: Phosphorus: 3.8 mg/dL (ref 2.5–4.6)

## 2019-11-20 LAB — LIPID PANEL
Cholesterol: 193 mg/dL (ref 0–200)
HDL: 60 mg/dL (ref >40)
LDL Cholesterol: 116 mg/dL — ABNORMAL HIGH (ref 0–99)
Total CHOL/HDL Ratio: 3.2 RATIO
Triglycerides: 83 mg/dL (ref <150)
VLDL: 17 mg/dL (ref 0–40)

## 2019-11-20 LAB — BASIC METABOLIC PANEL
Anion gap: 8 (ref 5–15)
BUN: 8 mg/dL (ref 8–23)
CO2: 19 mmol/L — ABNORMAL LOW (ref 22–32)
Calcium: 7.8 mg/dL — ABNORMAL LOW (ref 8.9–10.3)
Chloride: 113 mmol/L — ABNORMAL HIGH (ref 98–111)
Creatinine, Ser: 0.59 mg/dL (ref 0.44–1.00)
GFR calc Af Amer: >60 mL/min (ref >60)
GFR calc non Af Amer: >60 mL/min (ref >60)
Glucose, Bld: 93 mg/dL (ref 70–99)
Potassium: 3.5 mmol/L (ref 3.5–5.1)
Sodium: 140 mmol/L (ref 135–145)

## 2019-11-20 LAB — HEMOGLOBIN A1C
Hgb A1c MFr Bld: 5.4 % (ref 4.8–5.6)
Mean Plasma Glucose: 108.28 mg/dL

## 2019-11-20 LAB — FOLATE: Folate: 7.1 ng/mL (ref >5.9)

## 2019-11-20 LAB — VITAMIN B12: Vitamin B-12: 604 pg/mL (ref 180–914)

## 2019-11-20 LAB — MAGNESIUM: Magnesium: 1.8 mg/dL (ref 1.7–2.4)

## 2019-11-20 MED ORDER — MAGNESIUM SULFATE 2 GM/50ML IV SOLN
2.0000 g | Freq: Once | INTRAVENOUS | Status: AC
  Administered 2019-11-20: 12:00:00 2 g via INTRAVENOUS
  Filled 2019-11-20: qty 50

## 2019-11-20 MED ORDER — POTASSIUM CHLORIDE 20 MEQ/15ML (10%) PO SOLN
40.0000 meq | Freq: Two times a day (BID) | ORAL | Status: DC
  Administered 2019-11-20: 11:00:00 40 meq
  Filled 2019-11-20: qty 30

## 2019-11-20 NOTE — Progress Notes (Signed)
Patient transported to CT & back on vent with no problems.  Adianna Darwin RRT 

## 2019-11-20 NOTE — Progress Notes (Addendum)
..   NAME:  Olivia Mann, MRN:  [id number], DOB:  29-Dec-1955, LOS: 2 ADMISSION DATE:  11/18/2019, CONSULTATION DATE:  11/19/2019 REFERRING MD:  [id number] MD, CHIEF COMPLAINT:  Seizure   Brief History   64 yr old F w/ no sig PMHx presents with new onset GTC seizures preceded by headache. Found to have Diffuse Subarachnoid hemorrhage measuring 2.2 x 2.4 x 2.9 cm w/ intraparenchymal hematoma in the right anteromedial temporal lobe. 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 Neurosurgery managing. Patient for OR in AM. Intubated on sedation and BP control. PCCM consulted for assistance in ventilator management.  History of present illness   ( Pt is sedated on propofol and intubated History was obtained from EMR review and acct from other providers)  64 yr old F presented to [facility] on 11/18/2019 via EMS after a witnessed seizure w/ bladder incontinence per the patient's family.  On 11/18/2019 at 11 AM pt complained for a headache  After the initial seizure event the family took the patient to a local fire station. During transport with EMS patient had a subsequent generalized tonic clonic that lasted between 30-60 seconds.  On arrival to [facility] she was noted to be drowsy, lethargic, and responded to noxious stimuli with eye opening, and extremity movement. No focal weakness was noted. Pt was evaluated by Neurology and sent for imaging. CT head w/o contrast demonstrated extensive supratentorial subarachnoid hemorrhage with an infratentorial component.  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 There is a smaller second right supraclinoid aneurysm 3 mm diameter, slightly more proximally. Neurosurgery was consulted. Pt was started on Clevidipine for goal BP 120 or less Patient was loaded with AED Keppra 2g IV x 1 and started on maintenance 1g IV BID. Neurosurgery admitted patient to ICU with plans for OR. PCCM consulted for vent  management.  Past Medical History  .Olivia Mann Active Ambulatory Problems    Diagnosis Date Noted  . No Active Ambulatory Problems   Resolved Ambulatory Problems    Diagnosis Date Noted  . No Resolved Ambulatory Problems   No Additional Past Medical History   Social Hx: wine 2 glasses/day Fam Hx significant for Aneurysms (brother and sister)  Significant Hospital Events   SAH on imaging Endotracheally intubated   Consults:  Neurology- 11/18/2019 PCCM- 11/19/2019  Procedures:  1/3: Right frontotemporal craniotomy for clipping of internal carotid artery aneurysm, simple. Intraoperative ICG video angiography; Use of intraoperative microscope for microdissection;  Evacuation of right temporal hematoma.   Significant Diagnostic Tests:  CT Angio Head 11/18/2019 Anterior circulation: Both internal carotid arteries are widely patent through the skull base and siphon regions. Projecting laterally from the supraclinoid ICA, 2 mm proximal to the bifurcation, there is a slightly lobular aneurysm measuring up to 7 mm. There appears to be a wide mouth measuring up to 2.5 mm. Just proximal to that, there is a 2 mm aneurysm or infundibulum. No aneurysm seen in the middle cerebral artery branches. Azygos anterior cerebral artery without an aneurysm. Left internal carotid artery is widely patent. No aneurysm on the left. Left middle cerebral artery appears normal. Posterior circulation: Both vertebral arteries are widely patent to the basilar. No basilar stenosis. Posterior circulation branch vessels are normal. No sign of aneurysm. Venous sinuses: Patent and normal. Anatomic variants: Azygos anterior cerebral as noted above. No sign of discernible active extravasation at this moment. 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  The aneurysm is 2 to 3 mm proximal to the ICA bifurcation. 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  CXR 11/18/2019 FINDINGS: Cardiac shadows within normal limits. Endotracheal tube is seen 2 cm above the carina. Lungs are well aerated bilaterally. No focal infiltrate or sizable effusion is seen. No bony abnormality is noted. IMPRESSION: No acute abnormality noted. Endotracheal tube in satisfactory Position  DG ABD 11/18/2019 IMPRESSION: Tip and side port of the enteric tube below the diaphragm in the stomach.   CXR 1/4:  ETT at level of clavicles. Small LLL infiltrates vs atelectasis.  Micro Data:  11/18/2019  SARSCOV2 negative  Influenza A and B negative MRSA surveillance negative   Antimicrobials:  Cefazolin SCIP  Objective   Blood pressure 110/72, pulse 69, temperature 97.9 F (36.6 C), resp. rate 12, height 5\' 1"  (1.549 m), weight 53.9 kg, SpO2 100 %.    Vent Mode: PRVC FiO2 (%):  [40 %] 40 % Set Rate:  [12 bmp] 12 bmp Vt Set:  [450 mL] 450 mL PEEP:  [5 cmH20] 5 cmH20 Plateau Pressure:  [10 cmH20-16 cmH20] 12 cmH20   Intake/Output Summary (Last 24 hours) at 11/20/2019 01/18/2020 Last data filed at 11/20/2019 0700 Gross per 24 hour  Intake 4422.77 ml  Output 1095 ml  Net 3327.77 ml   Filed Weights   11/18/19 2303  Weight: 53.9 kg    Examination: General: Well-developed, well nourished adult female. NAD HENT: Bulky scalp dressing in place with dried bloody strike through. Unable to assess pupils d/t passive eye opening resistance. Exophthalmos  Neck: No JVD. Trachea midline CV: RRR. S1S2. No MRG. +2 distal pulses Lungs: BBS present, clear, FNL, symmetrical. On PSV ABD: +BS x4. SNT/ND. No masses, guarding or rigidity GU:  Foley EXT:  No edema Skin: PWD. In tact. No rashes or lesions Neuro: Moves L side spontaneously. Does not move right. Withdraws on L when stimulated on R. Does not follow commands. Resists passive eye opening     Subjective/Events No acute events overnight. On low dose propofol. Moving L side spontaneously. On PSV.   Assessment & Plan:  1. Generalized Tonic Clonic Seizures and Headache secondary to  Subarachnoid Hemorrhage:  measuring 2.2 x 2.4 x 2.9 cm w/ intraparenchymal hematoma in the right anteromedial temporal lobe. 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 S/P  Right frontotemporal craniotomy for clipping of internal carotid artery aneurysm, simple. Intraoperative ICG video angiography; Use of intraoperative microscope for microdissection;  Evacuation of right temporal hematoma.  Plan: Per neurosurgery AED Neuroprotective measures: Maintain euthermia, euglycemia, eunatremia, normoxia, pCO2 35-40, nutrition and bowel regimen, seizure precautions, head of bed elevated SBP <160 per neuro  Repeat CT head per neuro  2. Acute Respiratory Insufficiency in the setting of Acute Encephalopathy  Doing fairly well on PSV Plan: Advance ETT 3 cm Continue ventilator support to prevent eminent deterioration and further organ dysfunction from hypoxemia and hypercarbia.   Maintain SpO2 greater than or equal to 90%. Head of bed elevated 30 degrees. Plateau pressures less than 30 cm H20.  Follow chest x-ray, ABG prn   SAT/SBT as tolerated. Bronchial hygiene. RT/bronchodilator protocol. Mental status may preclude extubation  Will look to guidance from neurosurgery as to weaning protocol but plan for early vent  liberation    Acute Encephalopathy  Secondary to [facility] s/p clipping and evacuation of hematoma  Pt was noted to be thrashing during initial evaluation and agitated Requiring increasing sedation and intubation. She is thrashing on L side this am  Plan: Minimize Pain/sedation as much as possible for early vent liberation    Macrocytosis without Anemia MCV 103 on admission ETOH <10 on admission Given recent [facility] Recent Labs    11/19/19 1115  11/20/19 0418  HGB 11.6* 11.2*   Plan: Continue to monitor. Transfuse per protocol   Anion Gap Metabolic Acidosis. Resolved     Best practice:  Diet: NPO. TF per recs if unable to extubate Pain/Anxiety/Delirium protocol (if indicated): Propofol and Fentanyl  VAP protocol (if indicated): yes  DVT prophylaxis: SCDs alone GI prophylaxis: Protonix via OGT Glucose control: no h/o DM, if BG exceeds 180 mg/dl will start ISS. A1c 5.4 Mobility: Bedrest Code Status: Full Family Communication: Per primary Disposition: ICU  Labs   CBC: Recent Labs  Lab 11/18/19 1827 11/18/19 2135 11/19/19 0510 11/19/19 0734 11/19/19 1018 11/19/19 1115 11/20/19 0418  WBC 11.6* 13.4*  --  8.2  --   --  10.9*  NEUTROABS  --  11.3*  --  5.1  --   --  8.9*  HGB 14.5 14.7 13.6 13.8 11.9* 11.6* 11.2*  HCT 43.3 43.4 40.0 41.0 35.0* 34.0* 33.5*  MCV 103.1* 100.2*  --  102.8*  --   --  104.0*  PLT 236 234  --  201  --   --  174    Basic Metabolic Panel: Recent Labs  Lab 11/18/19 1827 11/18/19 1857 11/19/19 0510 11/19/19 0734 11/19/19 1018 11/19/19 1115 11/20/19 0418  NA 140 140 141 141 141 142 140  K 4.7 4.3 3.0* 3.4* 3.2* 3.1* 3.5  CL 107 109  --  108  --   --  113*  CO2 17*  --   --  20*  --   --  19*  GLUCOSE 112* 113*  --  73  --   --  93  BUN 13 17  --  10  --   --  8  CREATININE 0.83 0.50  --  0.54  --   --  0.59  CALCIUM 9.6  --   --  8.9  --   --  7.8*  MG  --   --   --  2.1  --   --  1.8  PHOS  --   --   --  3.9  --   --  3.8   GFR: Estimated Creatinine Clearance: 55 mL/min (by C-G formula based on SCr of 0.59 mg/dL). Recent Labs  Lab 11/18/19 1827 11/18/19 2135 11/19/19 0734 11/20/19 0418  WBC 11.6* 13.4* 8.2 10.9*     ABG    Component Value Date/Time   PHART 7.344 (L) 11/19/2019 1115   PCO2ART 39.2 11/19/2019 1115   PO2ART 227.0 (H) 11/19/2019 1115   HCO3 21.4 11/19/2019 1115   TCO2 23 11/19/2019 1115   ACIDBASEDEF 4.0 (H) 11/19/2019 1115   O2SAT 100.0  11/19/2019 1115       HbA1C: Hgb A1c MFr Bld  Date/Time Value Ref Range Status  11/20/2019 04:18 AM 5.4 4.8 - 5.6 % Final    Comment:    (NOTE) Pre diabetes:          5.7%-6.4% Diabetes:              >6.4% Glycemic control for   <  7.0% adults with diabetes     CBG: Recent Labs  Lab 11/18/19 1843 11/18/19 2318  GLUCAP 107* 105*     The patient is critically ill with respiratory failure. She requires ICU for high complexity decision making, titration of high alert medications, ventilator management, titration of oxygen and interpretation of advanced monitoring.    I personally spent 35 minutes providing critical care services including personally reviewing test results, discussing care with nursing staff/other physicians and completing orders pertaining to this patient.  Time was exclusive to the patient and does not include time spent teaching or in procedures.  Voice recognition software was used in the production of this record.  Errors in interpretation may have been inadvertently missed during review.  Francine Graven, MSN, AGACNP  [facility]  Attending Note:  64 year old female with SAH and ICH s/p aneurysm coiling by NS who presents to PCCM with respiratory failure requiring intubation.  PCCM consulted for vent management.  Overnight, no events.  On exam, withdraws the left but not right and not tolerating weaning.  I reviewed CXR myself, ETT is in a good position.  Discussed with PCCM-NP.  Will hold weaning attempt.  Keep volume up.  NS to speak with family regarding prognosis.  Replace electrolytes.  BMET in AM.  Propofol and cleveprix for BP control.  PCCM will continue to follow.  The patient is critically ill with multiple organ systems failure and requires high complexity decision making for assessment and support, frequent evaluation and titration of therapies, application of advanced monitoring technologies and extensive interpretation of  multiple databases.   Critical Care Time devoted to patient care services described in this note is  31  Minutes. This time reflects time of care of this signee Dr Jennet Maduro. This critical care time does not reflect procedure time, or teaching time or supervisory time of PA/NP/Med student/Med Resident etc but could involve care discussion time.  Rush Farmer, M.D. [facility] Pulmonary/Critical Care Medicine.

## 2019-11-20 NOTE — Progress Notes (Addendum)
  NEUROSURGERY PROGRESS NOTE   No issues overnight Remains intubated on sedation.  EXAM:  BP 108/75   Pulse 66   Temp 97.9 F (36.6 C)   Resp 12   Ht 5\' 1"  (1.549 m)   Wt 53.9 kg   SpO2 100%   BMI 22.45 kg/m   Intubated Propofol and fentanyl running Opens eyes to voice Pupils pinpoint Follows commands with right side (wiggling fingers/toes) No movement left side, although nursing reports moves LLE Incision: bandage in place, dried blood   IMPRESSION/PLAN 64 y.o. female pod #1 clipping right internal carotid artery aneurysm. Not following commands with left side, ?new from post op. CT head does show hypodensity right internal capsule, ?edema vs ischemia - continue supportive care, nimotop, keppra - Okay to extubate from NS perspective. This will help 68 obtain a more accurate neuro exam - Appreciate PCCM assistance

## 2019-11-20 NOTE — Progress Notes (Signed)
Transcranial Doppler  Date POD PCO2 HCT BP  MCA ACA PCA OPHT SIPH VERT Basilar  1/4 MR     Right  Left   66  24   -36  *   28  19   25  10    47  35   *  *     *         Right  Left                                            Right  Left                                             Right  Left                                             Right  Left                                            Right  Left                                            Right  Left                                        MCA = Middle Cerebral Artery      OPHT = Opthalmic Artery     BASILAR = Basilar Artery   ACA = Anterior Cerebral Artery     SIPH = Carotid Siphon PCA = Posterior Cerebral Artery   VERT = Verterbral Artery                   Normal MCA = 62+\-12 ACA = 50+\-12 PCA = 42+\-23    11/20/2019 4:08 PM

## 2019-11-20 NOTE — Progress Notes (Signed)
STROKE TEAM PROGRESS NOTE   HISTORY OF PRESENT ILLNESS (per record) Olivia Mann is an 64 y.o. female who presents to the [facility] ED via EMS with seizure. She was traveling with family when she had a seizure lasting approximately 5 minutes. She experienced bladder incontinence with the seizure. LKN per report prior to the was 5:45 PM; however, she had had a headache since 11 AM today. The family stopped at a local fire station where patient was noted to be confused and postictal. EMS was called and during transport, she had a GTC seizure lasting 30-60 seconds. She was given 5 mg IM midazolam. On arrival to the ED, she was somnolent. She was able to maintain 100% saturations on RA in the ED. She was noted to be moving her left side less than her right by staff in the ED. When attempting to obtain CT head, the patient would become agitated whenever she was positioned supine.   Dr. Wilford Corner evaluated the patient and noted the following: "Very drowsy and lethargic. Opens eyes to noxious stimulation. Moves all extremities to noxious stimulation and then becomes severely agitated and starts thrashing around. Pupils are 3 mm round reactive light. No gaze preference. Oculocephalics present. No focal weakness observed."  Subarachnoid hemorrhage was on the DDx due to headache since 11 AM, followed by the seizures. Benefits of obtaining CT under sedation for emergently essential diagnositic information were determined by Dr. Wilford Corner, Dr. Donnald Garre and myself to outweigh risks.   Per daughter, the patient had been complaining of a "real bad headache". She had had a mild headache yesterday that she an ASA for, just prior to going to bed. On awakening she said she did not feel well, had a slight headache that then eased up. Had no confusion or drowsiness. Then started traveling in the car with family. She then took an ASA or an Advil and took a nap in the car at about noon. When she got up at 1:30 PM and vomited, then went back  to sleep. She woke up again at 5:15 and had the seizure. Family then drove her to the fire department.     INTERVAL HISTORY Patient remains intubated for respiratory failure neurologically stuporous but can be aroused.  She follows some simple commands on the right side.  She has left hemiplegia which apparently is new after she came back from the OR.  She has been started on nimodipine.  Blood pressure adequately controlled. CT scan of the head obtained during rounds today shows improvement in the subarachnoid hemorrhage and post craniotomy changes but there is increase hypodensity in the right basal ganglia suggesting infarction versus increase edema.  Transcranial Doppler studies are pending  OBJECTIVE Vitals:   11/20/19 1200 11/20/19 1300 11/20/19 1400 11/20/19 1509  BP: 121/81 119/77 117/77   Pulse: 72 69 68 81  Resp: 19 14 12 14   Temp: 98.1 F (36.7 C) 97.9 F (36.6 C) 98.1 F (36.7 C) 98.4 F (36.9 C)  TempSrc:      SpO2: 100% 100% 100% 100%  Weight:      Height:        CBC:  Recent Labs  Lab 11/19/19 0734 11/19/19 1115 11/20/19 0418  WBC 8.2  --  10.9*  NEUTROABS 5.1  --  8.9*  HGB 13.8 11.6* 11.2*  HCT 41.0 34.0* 33.5*  MCV 102.8*  --  104.0*  PLT 201  --  174    Basic Metabolic Panel:  Recent Labs  Lab 11/19/19 0734  11/19/19 1115 11/20/19 0418  NA 141 142 140  K 3.4* 3.1* 3.5  CL 108  --  113*  CO2 20*  --  19*  GLUCOSE 73  --  93  BUN 10  --  8  CREATININE 0.54  --  0.59  CALCIUM 8.9  --  7.8*  MG 2.1  --  1.8  PHOS 3.9  --  3.8    Lipid Panel:     Component Value Date/Time   CHOL 193 11/20/2019 0418   TRIG 83 11/20/2019 0418   HDL 60 11/20/2019 0418   CHOLHDL 3.2 11/20/2019 0418   VLDL 17 11/20/2019 0418   LDLCALC 116 (H) 11/20/2019 0418   HgbA1c:  Lab Results  Component Value Date   HGBA1C 5.4 11/20/2019   Urine Drug Screen:     Component Value Date/Time   LABOPIA NONE DETECTED 11/19/2019 0028   COCAINSCRNUR NONE DETECTED  11/19/2019 0028   LABBENZ POSITIVE (A) 11/19/2019 0028   AMPHETMU NONE DETECTED 11/19/2019 0028   THCU POSITIVE (A) 11/19/2019 0028   LABBARB NONE DETECTED 11/19/2019 0028    Alcohol Level     Component Value Date/Time   ETH <10 11/18/2019 2135    IMAGING  CT ANGIO HEAD W OR WO CONTRAST CT ANGIO NECK W OR WO CONTRAST 11/18/2019 IMPRESSION:  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 The aneurysm is 2 to 3 mm proximal to the ICA bifurcation. 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   DG Chest Portable 1 View 11/18/2019 IMPRESSION:  No acute abnormality noted. Endotracheal tube in satisfactory position   DG Abd Portable 1V 11/19/2019 IMPRESSION:  Tip and side port of the enteric tube below the diaphragm in the stomach.   CT HEAD CODE STROKE WO CONTRAST  11/18/2019  IMPRESSION:  Diffuse subarachnoid hemorrhage. 2.2 x 2.4 x 2.9 cm intraparenchymal hematoma in the right anteromedial temporal lobe. Intraventricular communication on the right. This could be a primary intraparenchymal hemorrhage with subarachnoid and intraventricular penetration or the pattern could also indicate a ruptured aneurysm with an associated intraparenchymal hematoma.  Transcranial Dopplers - pending  ECG - pending  EEG - not ordered   PHYSICAL EXAM Blood pressure 117/77, pulse 81, temperature 98.4 F (36.9 C), resp. rate 14, height 5\' 1"  (1.549 m), weight 53.9 kg, SpO2 100 %. Frail middle-aged African-American lady not in distress.  Intubated.  On mild sedation. . Afebrile. Head is nontraumatic. Neck is supple without bruit.    Cardiac exam no murmur or gallop. Lungs are clear to auscultation. Distal pulses are well felt. Neurological Exam :  Patient is intubated and mildly sedated.  She has post craniotomy dressing on her head.  Eyes are closed.  She has right gaze  deviation.  She does not follow gaze consistently and does not blink to threat on either side.  Fundi not visualized.  Pupils irregular reactive.  She is able to move right upper and lower extremity to command against gravity.  She has dense left hemiplegia but will withdraw the left leg slightly to painful stimuli.  Tone is diminished on the left compared to the right.  Left plantar upgoing right downgoing.  ASSESSMENT/PLAN Ms. Misti Towle is a 64 y.o. female with no significant PMHX presenting with headache, new onset seizures, agitation, and AMS. She was found to have a  SAH as well as an aneurysm that may have ruptured.  She did not receive IV t-PA due to [facility].  Stroke:  Diffuse subarachnoid hemorrhage. 2.2 x 2.4 x 2.9 cm intraparenchymal hematoma in the right anteromedial temporal lobe   Code Stroke CT Head - Diffuse subarachnoid hemorrhage. 2.2 x 2.4 x 2.9 cm intraparenchymal hematoma in the right anteromedial temporal lobe. Intraventricular communication on the right.  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 The aneurysm is 2 to 3 mm proximal to the ICA bifurcation. 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   Transcranial Dopplers - pending  CT Perfusion - not ordered  Carotid Doppler - CTA neck performed - carotid dopplers not indicated.  2D Echo - not indicated  [employer] Virus 2 - negative  LDL -1 1 6  mg percent  HgbA1c -5.4  UDS - THCU and benzodiazepines  VTE prophylaxis - SCDs Diet  Diet Order            Diet NPO time specified  Diet effective now              No antithrombotic prior to admission, now on No antithrombotic  Ongoing aggressive stroke risk factor management  Therapy recommendations:  pending  Disposition:   Pending  Hypertension  Home BP meds: none   Current BP meds: Cleviprex  SBP - mildly low . Long-term BP goal normotensive  Hyperlipidemia  Home Lipid lowering medication: none   LDL -1 1 6  mg percent goal < 70  Current lipid lowering medication: none   Continue statin at discharge  Other Stroke Risk Factors  Advanced age  Cigarette smoker - not on file  ETOH use - not on file   Family hx of aneurysms  Substance Abuse - THCU  Other Active Problems  Seizures - Keppra  SAH / aneurysm - surgery to be discussed with family - Nimodipine  Hypokalemia - 4.3->3.4->3.0 - supplement and recheck  Leukocytosis - 11.6->13.4 (afebrile)  Macrocytic - check B12 and folate  Intubated / sedated  Hospital day # 2 I have personally obtained history,examined this patient, reviewed notes, independently viewed imaging studies, participated in medical decision making and plan of care.ROS completed by me personally and pertinent positives fully documented  I have made any additions or clarifications directly to the above note. . She presented with seizures and brain imaging showed aneurysmal subarachnoid hemorrhage due to right terminal carotid aneurysm for which he underwent craniotomy and clipping.  Postprocedure she appears to have left hemiplegia likely from right brain infarct related to her subarachnoid hemorrhage and surgical treatment.  Recommend continue strict blood pressure control with systolic goal below 160, continue nimodipine and check serial transcranial Doppler studies.  Recommend start Keppra for seizure prophylaxis given abnormal MRI appearance of hippocampal on the left and patient's presentation with seizures she is at high risk for recurrence.  Long discussion of the bedside with the patient's husband about her prognosis and plan for treatment and answered questions.  Discussed with critical care team. This patient is critically ill and at significant risk of  neurological worsening, death and care requires constant monitoring of vital signs, hemodynamics,respiratory and cardiac monitoring, extensive review of multiple databases, frequent neurological assessment, discussion  with family, other specialists and medical decision making of high complexity.I have made any additions or clarifications directly to the above note.This critical care time does not reflect procedure time, or teaching time or supervisory time of PA/NP/Med Resident etc but could involve care discussion time.  I spent 35 minutes of neurocritical care time  in the care of  this patient.     Antony Contras, MD Medical Director [facility] Pager: [phone] 11/20/2019 3:59 PM        To contact Stroke Continuity provider, please refer to [email or web address] After hours, contact General Neurology

## 2019-11-20 NOTE — Procedures (Signed)
Extubation Procedure Note  Patient Details:   Name: Tenise Stetler DOB: 03/02/1956 MRN: [id number]   Airway Documentation:    Vent end date: 11/20/19 Vent end time: 1500   Evaluation  O2 sats: stable throughout Complications: No apparent complications Patient did tolerate procedure well. Bilateral Breath Sounds: Rhonchi   Patient extubated per MD order to 4L [place]. Patient had positive cuff leak prior to extubation. Post extubation patient unable to speak or cough at my request, due to understanding? Patient in no distress at all with RR-18. Will continue to monitor.  Jacqulynn Cadet 11/20/2019, 3:06 PM

## 2019-11-20 NOTE — Progress Notes (Signed)
Called Dr Conchita Paris, and spoke with [facility] and Dr Franky Macho as well, about patient's recent extubation at 1500 and need to give PO nimodipine.  Pt is too lethargic to assess swallowing at this point. Dr Conchita Paris said it was ok to miss a few doses as she wakes up and/or speech therapy is able to assess her swallowing tomorrow. I will continue to monitor. Olivia Mann C 5:51 PM

## 2019-11-21 ENCOUNTER — Inpatient Hospital Stay (HOSPITAL_COMMUNITY): Payer: Self-pay

## 2019-11-21 DIAGNOSIS — J988 Other specified respiratory disorders: Secondary | ICD-10-CM

## 2019-11-21 LAB — CBC
HCT: 37.5 % (ref 36.0–46.0)
Hemoglobin: 12.7 g/dL (ref 12.0–15.0)
MCH: 34.6 pg — ABNORMAL HIGH (ref 26.0–34.0)
MCHC: 33.9 g/dL (ref 30.0–36.0)
MCV: 102.2 fL — ABNORMAL HIGH (ref 80.0–100.0)
Platelets: 198 10*3/uL (ref 150–400)
RBC: 3.67 MIL/uL — ABNORMAL LOW (ref 3.87–5.11)
RDW: 14.6 % (ref 11.5–15.5)
WBC: 11.9 10*3/uL — ABNORMAL HIGH (ref 4.0–10.5)
nRBC: 0 % (ref 0.0–0.2)

## 2019-11-21 LAB — BASIC METABOLIC PANEL
Anion gap: 12 (ref 5–15)
BUN: <5 mg/dL — ABNORMAL LOW (ref 8–23)
CO2: 20 mmol/L — ABNORMAL LOW (ref 22–32)
Calcium: 8.6 mg/dL — ABNORMAL LOW (ref 8.9–10.3)
Chloride: 106 mmol/L (ref 98–111)
Creatinine, Ser: 0.57 mg/dL (ref 0.44–1.00)
GFR calc Af Amer: >60 mL/min (ref >60)
GFR calc non Af Amer: >60 mL/min (ref >60)
Glucose, Bld: 90 mg/dL (ref 70–99)
Potassium: 3.7 mmol/L (ref 3.5–5.1)
Sodium: 138 mmol/L (ref 135–145)

## 2019-11-21 LAB — PHOSPHORUS: Phosphorus: 1.5 mg/dL — ABNORMAL LOW (ref 2.5–4.6)

## 2019-11-21 LAB — MAGNESIUM: Magnesium: 2.2 mg/dL (ref 1.7–2.4)

## 2019-11-21 MED ORDER — LORAZEPAM 1 MG PO TABS: 1.0000 mg | ORAL_TABLET | Freq: Four times a day (QID) | ORAL | Status: DC | PRN

## 2019-11-21 MED ORDER — ORAL CARE MOUTH RINSE
15.0000 mL | Freq: Two times a day (BID) | OROMUCOSAL | Status: DC
  Administered 2019-11-21 – 2019-12-01 (×21): 15 mL via OROMUCOSAL

## 2019-11-21 MED ORDER — HYDRALAZINE HCL 20 MG/ML IJ SOLN
5.0000 mg | INTRAMUSCULAR | Status: DC | PRN
  Administered 2019-11-22: 10 mg via INTRAVENOUS
  Administered 2019-11-26: 5 mg via INTRAVENOUS
  Administered 2019-11-26: 10 mg via INTRAVENOUS
  Filled 2019-11-21 (×3): qty 1

## 2019-11-21 MED ORDER — CHLORHEXIDINE GLUCONATE 0.12 % MT SOLN
15.0000 mL | Freq: Two times a day (BID) | OROMUCOSAL | Status: DC
  Administered 2019-11-21 – 2019-12-01 (×21): 15 mL via OROMUCOSAL
  Filled 2019-11-21 (×16): qty 15

## 2019-11-21 MED ORDER — LORAZEPAM 2 MG/ML IJ SOLN: 1.0000 mg | Freq: Once | INTRAMUSCULAR | Status: AC

## 2019-11-21 MED ORDER — LABETALOL HCL 5 MG/ML IV SOLN
5.0000 mg | INTRAVENOUS | Status: DC | PRN
  Administered 2019-11-23: 20 mg via INTRAVENOUS
  Filled 2019-11-21: qty 4

## 2019-11-21 MED ORDER — JEVITY 1.2 CAL PO LIQD
1000.0000 mL | ORAL | Status: DC
  Administered 2019-11-23 – 2019-11-29 (×7): 1000 mL
  Filled 2019-11-21 (×10): qty 1000

## 2019-11-21 MED ORDER — ALPRAZOLAM 0.5 MG PO TABS
1.0000 mg | ORAL_TABLET | Freq: Four times a day (QID) | ORAL | Status: DC | PRN
  Administered 2019-11-21 – 2019-11-29 (×11): 1 mg
  Filled 2019-11-21 (×2): qty 2
  Filled 2019-11-21: qty 4
  Filled 2019-11-21 (×11): qty 2

## 2019-11-21 MED ORDER — POTASSIUM PHOSPHATES 15 MMOLE/5ML IV SOLN
20.0000 mmol | Freq: Once | INTRAVENOUS | Status: AC
  Administered 2019-11-21: 20 mmol via INTRAVENOUS
  Filled 2019-11-21: qty 6.67

## 2019-11-21 MED ORDER — LORAZEPAM 2 MG/ML IJ SOLN
INTRAMUSCULAR | Status: AC
  Administered 2019-11-21: 1 mg via INTRAVENOUS
  Filled 2019-11-21: qty 1

## 2019-11-21 NOTE — Progress Notes (Signed)
..   NAME:  Olivia Mann, MRN:  [id number], DOB:  Oct 20, 1956, LOS: 3 ADMISSION DATE:  11/18/2019, CONSULTATION DATE:  11/19/2019 REFERRING MD:  [id number] MD, CHIEF COMPLAINT:  Seizure   Brief History   64 yr old F w/ no sig PMHx presents with new onset GTC seizures preceded by headache. Found to have Diffuse Subarachnoid hemorrhage measuring 2.2 x 2.4 x 2.9 cm w/ intraparenchymal hematoma in the right anteromedial temporal lobe. 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 Neurosurgery managing. Patient for OR in AM. Intubated on sedation and BP control. PCCM consulted for assistance in ventilator management.  Significant Hospital Events   SAH on imaging Endotracheally intubated   Consults:  Neurology- 11/18/2019  Procedures:  1/3: Right frontotemporal craniotomy for clipping of internal carotid artery aneurysm, simple. Intraoperative ICG video angiography; Use of intraoperative microscope for microdissection;  Evacuation of right temporal hematoma.   Significant Diagnostic Tests:  CT Angio Head 11/18/2019 Anterior circulation: Both internal carotid arteries are widely patent through the skull base and siphon regions. Projecting laterally from the supraclinoid ICA, 2 mm proximal to the bifurcation, there is a slightly lobular aneurysm measuring up to 7 mm. There appears to be a wide mouth measuring up to 2.5 mm. Just proximal to that, there is a 2 mm aneurysm or infundibulum. No aneurysm seen in the middle cerebral artery branches. Azygos anterior cerebral artery without an aneurysm. Left internal carotid artery is widely patent. No aneurysm on the left. Left middle cerebral artery appears normal. Posterior circulation: Both vertebral arteries are widely patent to the basilar. No basilar stenosis. Posterior circulation branch vessels are normal. No sign of aneurysm. Venous sinuses: Patent and normal. Anatomic variants: Azygos anterior cerebral as  noted above. No sign of discernible active extravasation at this moment. 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 The aneurysm is 2 to 3 mm proximal to the ICA bifurcation. 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  Micro Data:  11/18/2019  SARSCOV2 negative  Influenza A and B negative MRSA surveillance negative   Antimicrobials:  Cefazolin SCIP  Objective   Blood pressure 137/84, pulse 80, temperature 99.5 F (37.5 C), resp. rate (!) 28, height 5\' 1"  (1.549 m), weight 53.9 kg, SpO2 95 %.    Vent Mode: CPAP;PSV FiO2 (%):  [40 %] 40 % PEEP:  [5 cmH20] 5 cmH20 Pressure Support:  [8 cmH20] 8 cmH20   Intake/Output Summary (Last 24 hours) at 11/21/2019 0809 Last data filed at 11/21/2019 0800 Gross per 24 hour  Intake 2224.84 ml  Output 2745 ml  Net -520.16 ml   Filed Weights   11/18/19 2303  Weight: 53.9 kg    Examination:  General -  Intermittently agitated Eyes - Rt eye swollen ENT - no stridor Cardiac - regular rate/rhythm, no murmur Chest - equal breath sounds b/l, no wheezing or rales Abdomen - soft, non tender, + bowel sounds Extremities - no cyanosis, clubbing, or edema Skin - no rashes Neuro - moves Rt side    Assessment & Plan:   SAH with seizure. - per neurosurgery  Compromised airway. - successfully extubated 1/04  Dysphagia. - f/u with speech therapy   Best practice:  Diet: NPO DVT prophylaxis: SCD GI prophylaxis: Protonix Mobility:  PT Code Status: Full Disposition: ICU  PCCM will sign off.  Please call if additional help needed   Labs    CMP Latest Ref Rng & Units 11/21/2019 11/20/2019 11/19/2019  Glucose 70 - 99 mg/dL 90 93 -  BUN 8 - 23 mg/dL <5(L) 8 -  Creatinine 0.44 - 1.00 mg/dL 0.57 0.59 -  Sodium 135 - 145 mmol/L 138 140 142    Potassium 3.5 - 5.1 mmol/L 3.7 3.5 3.1(L)  Chloride 98 - 111 mmol/L 106 113(H) -  CO2 22 - 32 mmol/L 20(L) 19(L) -  Calcium 8.9 - 10.3 mg/dL 8.6(L) 7.8(L) -    CBC Latest Ref Rng & Units 11/21/2019 11/20/2019 11/19/2019  WBC 4.0 - 10.5 K/uL 11.9(H) 10.9(H) -  Hemoglobin 12.0 - 15.0 g/dL 12.7 11.2(L) 11.6(L)  Hematocrit 36.0 - 46.0 % 37.5 33.5(L) 34.0(L)  Platelets 150 - 400 K/uL 198 174 -    ABG    Component Value Date/Time   PHART 7.344 (L) 11/19/2019 1115   PCO2ART 39.2 11/19/2019 1115   PO2ART 227.0 (H) 11/19/2019 1115   HCO3 21.4 11/19/2019 1115   TCO2 23 11/19/2019 1115   ACIDBASEDEF 4.0 (H) 11/19/2019 1115   O2SAT 100.0 11/19/2019 1115    CBG (last 3)  Recent Labs    11/18/19 1843 11/18/19 2318  GLUCAP 107* [place], MD [place] 11/21/2019, 8:14 AM

## 2019-11-21 NOTE — Procedures (Signed)
Cortrak  Person Inserting Tube:  Renie Ora, RD Tube Type:  Cortrak - 43 inches Tube Location:  Left nare Initial Placement:  Stomach Secured by: Bridle Technique Used to Measure Tube Placement:  Documented cm marking at nare/ corner of mouth Cortrak Secured At:  63 cm Procedure Comments:  Cortrak Tube Team Note:  Consult received to place a Cortrak feeding tube.   No x-ray is required. RN may begin using tube.    If the tube becomes dislodged please keep the tube and contact the Cortrak team at [email or web address] ([email or web address]) for replacement.  If after hours and replacement cannot be delayed, place a NG tube and confirm placement with an abdominal x-ray.     Trenton Gammon, MS, RD, LDN, [facility] Inpatient Clinical Dietitian Pager # [phone] After hours/weekend pager # [phone]

## 2019-11-21 NOTE — Progress Notes (Signed)
  NEUROSURGERY PROGRESS NOTE   Agitation requiring prn ativan yesterday. Just received 45 min prior to my evaluation this am  EXAM:  BP 137/84   Pulse 80   Temp 99.5 F (37.5 C)   Resp (!) 28   Ht 5\' 1"  (1.549 m)   Wt 53.9 kg   SpO2 95%   BMI 22.45 kg/m   Drowsy Awakens to voice Follows commands right side, left hemiplegia Incision: c/d/i  IMPRESSION/PLAN 63 y.o. 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 - continue supportive care - PT/OT/SLP rec CIR. Consult placed - Nimotop, TCDs

## 2019-11-21 NOTE — Plan of Care (Signed)
  Problem: Clinical Measurements: Goal: Respiratory complications will improve Outcome: Progressing   Problem: Elimination: Goal: Will not experience complications related to bowel motility Outcome: Progressing Goal: Will not experience complications related to urinary retention Outcome: Progressing   Problem: Education: Goal: Knowledge of disease or condition will improve Outcome: Progressing Goal: Knowledge of secondary prevention will improve Outcome: Progressing   Problem: Self-Care: Goal: Ability to communicate needs accurately will improve Outcome: Progressing

## 2019-11-21 NOTE — Progress Notes (Signed)
  NEUROSURGERY PROGRESS NOTE   Pt seen and examined. No issues overnight.   EXAM: Temp:  [97.9 F (36.6 C)-99.7 F (37.6 C)] 98 F (36.7 C) (01/05 0800) Pulse Rate:  [68-125] 89 (01/05 0900) Resp:  [12-28] 23 (01/05 0900) BP: (110-139)/(69-101) 139/101 (01/05 0900) SpO2:  [92 %-100 %] 96 % (01/05 0900) Arterial Line BP: (127-186)/(63-90) 150/70 (01/05 0600) FiO2 (%):  [40 %] 40 % (01/04 1115) Intake/Output      01/04 0701 - 01/05 0700 01/05 0701 - 01/06 0700   I.V. (mL/kg) 1963.4 (36.4) 151.4 (2.8)   NG/GT     IV Piggyback 250.7    Total Intake(mL/kg) 2214 (41.1) 151.4 (2.8)   Urine (mL/kg/hr) 2745 (2.1)    Blood     Total Output 2745    Net -531 +151.4         Awake, alert Speech somewhat dysarthric but fluent Left facial droop Follows commands briskly with good strength RUE/RLE No voluntary movement LUE/LLE Wound c/d/i  LABS: Lab Results  Component Value Date   CREATININE 0.57 11/21/2019   BUN <5 (L) 11/21/2019   NA 138 11/21/2019   K 3.7 11/21/2019   CL 106 11/21/2019   CO2 20 (L) 11/21/2019   Lab Results  Component Value Date   WBC 11.9 (H) 11/21/2019   HGB 12.7 11/21/2019   HCT 37.5 11/21/2019   MCV 102.2 (H) 11/21/2019   PLT 198 11/21/2019    IMAGING: CTH yesterday reviewed with reduction in temporal hematoma, slight hypodensity within the right PLIC  IMPRESSION: - 64 y.o. female SAH d#3 s/p clipping right Pcom with Left hemiplegia. With CT findings this is likely due to right AChor stroke although unclear etiology. Intraoperative post-clipping inspection did not reveal the AChor within the clip blades, and pt did have at least mild left hemiplegia preop.  PLAN: - Cont supportive care - SLP eval then advance diet - PT/OT today - Nimotop and TCD monitoring per protocol

## 2019-11-21 NOTE — Evaluation (Signed)
Clinical/Bedside Swallow Evaluation Patient Details  Name: Olivia Mann MRN: [id number] Date of Birth: [birth year]  Today's Date: 11/21/2019 Time: SLP Start Time (ACUTE ONLY): 0846 SLP Stop Time (ACUTE ONLY): 0911 SLP Time Calculation (min) (ACUTE ONLY): 25 min  Past Medical History: History reviewed. No pertinent past medical history. Past Surgical History:  Past Surgical History:  Procedure Laterality Date  . CRANIOTOMY Right 11/19/2019   Procedure: CRANIOTOMY CLIPPING OF CAROTID ANEURYSM;  Surgeon: Lisbeth Renshaw, MD;  Location: [facility] OR;  Service: Neurosurgery;  Laterality: Right;   HPI:  Pt is a 64 y.o. female with no significant PMHX presenting with headache, new onset seizures, agitation, and AMS. 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 ETT 1/3-1/4. No known PMH.   Assessment / Plan / Recommendation Clinical Impression  Pt has signs of dysphagia with concern for decreased airway protection, including immediate coughing after thin liquids and ice chips. She does not cough with purees but her mentation and positioning also put her at risk. She is frequently repositioned more upright but does not maintain this posture, trying to slide to her L with food in her mouth. She is drowsy and does not consistently follow commands. I would recommend that she remain NPO today except for meds crushed in puree, although per RN, she does have liquid medications. May want to pursue Cortrak in order to consistently and more safely administer PO medications pending improvements in mentation. SLP will f/u for PO readiness as well as full cognitive evaluation.  SLP Visit Diagnosis: Dysphagia, oropharyngeal phase (R13.12)    Aspiration Risk  Moderate aspiration risk    Diet Recommendation NPO   Medication Administration: Via alternative means    Other  Recommendations Oral Care Recommendations: Oral care  QID Other Recommendations: Have oral suction available   Follow up Recommendations Inpatient Rehab      Frequency and Duration min 2x/week  2 weeks       Prognosis Prognosis for Safe Diet Advancement: Good      Swallow Study   General HPI: Pt is a 64 y.o. female with no significant PMHX presenting with headache, new onset seizures, agitation, and AMS. 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 ETT 1/3-1/4. No known PMH. Type of Study: Bedside Swallow Evaluation Previous Swallow Assessment: none in chart Diet Prior to this Study: NPO Temperature Spikes Noted: No Respiratory Status: Room air History of Recent Intubation: Yes Length of Intubations (days): 1 days Date extubated: 11/20/19 Behavior/Cognition: Lethargic/Drowsy;Cooperative;Requires cueing Oral Cavity Assessment: (does not let me look in her mouth well) Oral Care Completed by SLP: Yes Oral Cavity - Dentition: Missing dentition Vision: Functional for self-feeding Self-Feeding Abilities: Needs assist Patient Positioning: Upright in bed(needs frequent repositioning) Baseline Vocal Quality: Low vocal intensity;Other (comment)(rough) Volitional Swallow: Unable to elicit    Oral/Motor/Sensory Function Overall Oral Motor/Sensory Function: Moderate impairment Facial ROM: Reduced left;Suspected CN VII (facial) dysfunction Facial Symmetry: Abnormal symmetry left;Suspected CN VII (facial) dysfunction Facial Strength: Reduced left;Suspected CN VII (facial) dysfunction Lingual Symmetry: Within Functional Limits   Ice Chips Ice chips: Impaired Presentation: Spoon;Self Fed Oral Phase Impairments: Reduced labial seal Oral Phase Functional Implications: Left anterior spillage Pharyngeal Phase Impairments: Cough - Immediate   Thin Liquid Thin Liquid: Impaired Presentation: Spoon Oral  Phase Impairments: Reduced labial seal Oral  Phase Functional Implications: Left anterior spillage Pharyngeal  Phase Impairments: Cough - Immediate    Nectar Thick Nectar Thick Liquid: Not tested   Honey Thick Honey Thick Liquid: Not tested   Puree Puree: Impaired Presentation: Spoon;Self Fed Oral Phase Impairments: Reduced labial seal Oral Phase Functional Implications: Prolonged oral transit Pharyngeal Phase Impairments: Multiple swallows   Solid     Solid: Not tested       Osie Bond., M.A. [place] Pager [phone] Office [phone]  11/21/2019,9:49 AM

## 2019-11-21 NOTE — Progress Notes (Signed)
STROKE TEAM PROGRESS NOTE      INTERVAL HISTORY Patient was extubated yesterday and seems to be breathing all right but remains neurologically stuporous but can be aroused.  She follows some simple commands on the right side.  She has left hemiplegia   Blood pressure adequately controlled. .  Transcranial Doppler studies are suboptimal but do not show vasospasm OBJECTIVE Vitals:   11/21/19 0900 11/21/19 1000 11/21/19 1100 11/21/19 1200  BP: (!) 139/101 (!) 150/90 (!) 153/85 (!) 158/87  Pulse: 89 75 75 79  Resp: (!) 23 (!) 28 (!) 21 (!) 26  Temp:    (!) 97.5 F (36.4 C)  TempSrc:    Oral  SpO2: 96% 96% 97% 98%  Weight:      Height:        CBC:  Recent Labs  Lab 11/19/19 0734 11/20/19 0418 11/21/19 0347  WBC 8.2 10.9* 11.9*  NEUTROABS 5.1 8.9*  --   HGB 13.8 11.2* 12.7  HCT 41.0 33.5* 37.5  MCV 102.8* 104.0* 102.2*  PLT 201 174 198    Basic Metabolic Panel:  Recent Labs  Lab 11/20/19 0418 11/21/19 0347  NA 140 138  K 3.5 3.7  CL 113* 106  CO2 19* 20*  GLUCOSE 93 90  BUN 8 <5*  CREATININE 0.59 0.57  CALCIUM 7.8* 8.6*  MG 1.8 2.2  PHOS 3.8 1.5*    Lipid Panel:     Component Value Date/Time   CHOL 193 11/20/2019 0418   TRIG 83 11/20/2019 0418   HDL 60 11/20/2019 0418   CHOLHDL 3.2 11/20/2019 0418   VLDL 17 11/20/2019 0418   LDLCALC 116 (H) 11/20/2019 0418   HgbA1c:  Lab Results  Component Value Date   HGBA1C 5.4 11/20/2019   Urine Drug Screen:     Component Value Date/Time   LABOPIA NONE DETECTED 11/19/2019 0028   COCAINSCRNUR NONE DETECTED 11/19/2019 0028   LABBENZ POSITIVE (A) 11/19/2019 0028   AMPHETMU NONE DETECTED 11/19/2019 0028   THCU POSITIVE (A) 11/19/2019 0028   LABBARB NONE DETECTED 11/19/2019 0028    Alcohol Level     Component Value Date/Time   ETH <10 11/18/2019 2135    IMAGING  CT ANGIO HEAD W OR WO CONTRAST CT ANGIO NECK W OR WO CONTRAST 11/18/2019 IMPRESSION:  Lobular laterally projecting aneurysm from the supraclinoid  ICA on the right measuring up to 7 mm in length with a wide mouth measuring up to 2.5 mm. The aneurysm is 2 to 3 mm proximal to the ICA bifurcation. 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   DG Chest Portable 1 View 11/18/2019 IMPRESSION:  No acute abnormality noted. Endotracheal tube in satisfactory position   DG Abd Portable 1V 11/19/2019 IMPRESSION:  Tip and side port of the enteric tube below the diaphragm in the stomach.   CT HEAD CODE STROKE WO CONTRAST  11/18/2019  IMPRESSION:  Diffuse subarachnoid hemorrhage. 2.2 x 2.4 x 2.9 cm intraparenchymal hematoma in the right anteromedial temporal lobe. Intraventricular communication on the right. This could be a primary intraparenchymal hemorrhage with subarachnoid and intraventricular penetration or the pattern could also indicate a ruptured aneurysm with an associated intraparenchymal hematoma.  Transcranial Dopplers - pending  ECG - pending  EEG - not ordered   PHYSICAL EXAM Blood pressure (!) 158/87, pulse 79, temperature (!) 97.5 F (36.4 C), temperature source Oral, resp. rate (!) 26, height 5\' 1"  (1.549 m), weight 53.9 kg, SpO2 98 %.  Frail middle-aged African-American lady not in distress.. . Afebrile. Head is nontraumatic. Neck is supple without bruit.    Cardiac exam no murmur or gallop. Lungs are clear to auscultation. Distal pulses are well felt. Neurological Exam :  Patient is drowsy but can be easily aroused she has post craniotomy dressing on her head.  Eyes are closed.  She has right gaze deviation.  She does not follow gaze consistently and does not blink to threat on either side.  Fundi not visualized.  Pupils irregular reactive.  She is able to move right upper and lower extremity to command against gravity.  She has dense left hemiplegia but will withdraw the left leg slightly to painful stimuli.  Tone is diminished on the left compared to the right.  Left plantar upgoing right  downgoing.  ASSESSMENT/PLAN Olivia Mann is a 64 y.o. female with no significant PMHX presenting with headache, new onset seizures, agitation, and AMS. She was found to have a SAH as well as an aneurysm that may have ruptured.  She did not receive IV t-PA due to [facility].  Stroke:  Diffuse subarachnoid hemorrhage. 2.2 x 2.4 x 2.9 cm intraparenchymal hematoma in the right anteromedial temporal lobe   Code Stroke CT Head - Diffuse subarachnoid hemorrhage. 2.2 x 2.4 x 2.9 cm intraparenchymal hematoma in the right anteromedial temporal lobe. Intraventricular communication on the right.  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 The aneurysm is 2 to 3 mm proximal to the ICA bifurcation. 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   Transcranial Dopplers - pending  CT Perfusion - not ordered  Carotid Doppler - CTA neck performed - carotid dopplers not indicated.  2D Echo - not indicated  [employer] Virus 2 - negative  LDL -1 1 6  mg percent  HgbA1c -5.4  UDS - THCU and benzodiazepines  VTE prophylaxis - SCDs Diet  Diet Order            Diet NPO time specified  Diet effective now              No antithrombotic prior to admission, now on No antithrombotic  Ongoing aggressive stroke risk factor management  Therapy recommendations:  pending  Disposition:  Pending  Hypertension  Home BP meds: none   Current BP meds: Cleviprex  SBP - mildly low . Long-term BP goal normotensive  Hyperlipidemia  Home Lipid lowering medication: none   LDL -1 1 6  mg percent goal < 70  Current lipid lowering medication: none   Continue statin at discharge  Other Stroke Risk Factors  Advanced age  Cigarette smoker - not on file  ETOH use - not on file    Family hx of aneurysms  Substance Abuse - THCU  Other Active Problems  Seizures - Keppra  SAH / aneurysm - surgery to be discussed with family - Nimodipine  Hypokalemia - 4.3->3.4->3.0 - supplement and recheck  Leukocytosis - 11.6->13.4 (afebrile)  Macrocytic - check B12 and folate  Intubated / sedated  Hospital day # 3  She presented with seizures and brain imaging showed aneurysmal subarachnoid hemorrhage due to right terminal carotid aneurysm for which he underwent craniotomy and clipping.  Postprocedure she appears to have  left hemiplegia likely from right brain infarct related to her subarachnoid hemorrhage and surgical treatment.  Recommend continue strict blood pressure control with systolic goal below 160, continue nimodipine and check serial transcranial Doppler studies.  Continue Keppra for seizure prophylaxis given abnormal MRI appearance of hippocampal on the left and patient's presentation with seizures she is at high risk for recurrence..  Discussed with critical care team. This patient is critically ill and at significant risk of neurological worsening, death and care requires constant monitoring of vital signs, hemodynamics,respiratory and cardiac monitoring, extensive review of multiple databases, frequent neurological assessment, discussion with family, other specialists and medical decision making of high complexity.I have made any additions or clarifications directly to the above note.This critical care time does not reflect procedure time, or teaching time or supervisory time of PA/NP/Med Resident etc but could involve care discussion time.  I spent 30 minutes of neurocritical care time  in the care of  this patient.     Delia Heady, MD Medical Director [facility] Pager: [phone] 11/21/2019 2:35 PM        To contact Stroke Continuity provider, please refer to [email or web address]. After hours, contact General Neurology

## 2019-11-21 NOTE — Progress Notes (Signed)
Pt's son is taking all belongings home with him tonight including clothes, jewelry, and cell phone.

## 2019-11-21 NOTE — Progress Notes (Signed)
ABG results did not roll over into Epic from [place].  ABG results from 11/20/2018 at 0440:  Ph: 7.42 PCO2: 31.5 PO2: 60 Bicarb: 20.4 91%.

## 2019-11-21 NOTE — Progress Notes (Signed)
Pt attempting to crawl out of bed/pulling at lines and tubes with HR in the 130-140s despite verbal de-escalation techniques, re-position, decrease stimulation, etc. Called MD Nundkumar to notify. Verbal order received for PRN ativan per tube and one time dose 1 mg ativan IV . Pt is now calm, resting, and vital signs within normal limits. Will continue to monitor.

## 2019-11-22 ENCOUNTER — Inpatient Hospital Stay (HOSPITAL_COMMUNITY): Payer: Self-pay

## 2019-11-22 DIAGNOSIS — G936 Cerebral edema: Secondary | ICD-10-CM

## 2019-11-22 DIAGNOSIS — R000 Tachycardia, unspecified: Secondary | ICD-10-CM

## 2019-11-22 DIAGNOSIS — D72829 Elevated white blood cell count, unspecified: Secondary | ICD-10-CM

## 2019-11-22 DIAGNOSIS — I10 Essential (primary) hypertension: Secondary | ICD-10-CM

## 2019-11-22 DIAGNOSIS — I609 Nontraumatic subarachnoid hemorrhage, unspecified: Secondary | ICD-10-CM

## 2019-11-22 DIAGNOSIS — R0682 Tachypnea, not elsewhere classified: Secondary | ICD-10-CM

## 2019-11-22 LAB — POCT I-STAT 7, (LYTES, BLD GAS, ICA,H+H)
Acid-base deficit: 3 mmol/L — ABNORMAL HIGH (ref 0.0–2.0)
Bicarbonate: 20.4 mmol/L (ref 20.0–28.0)
Calcium, Ion: 1.21 mmol/L (ref 1.15–1.40)
HCT: 35 % — ABNORMAL LOW (ref 36.0–46.0)
Hemoglobin: 11.9 g/dL — ABNORMAL LOW (ref 12.0–15.0)
O2 Saturation: 91 %
Patient temperature: 37.5
Potassium: 3.5 mmol/L (ref 3.5–5.1)
Sodium: 139 mmol/L (ref 135–145)
TCO2: 21 mmol/L — ABNORMAL LOW (ref 22–32)
pCO2 arterial: 31.5 mmHg — ABNORMAL LOW (ref 32.0–48.0)
pH, Arterial: 7.421 (ref 7.350–7.450)
pO2, Arterial: 60 mmHg — ABNORMAL LOW (ref 83.0–108.0)

## 2019-11-22 LAB — BASIC METABOLIC PANEL
Anion gap: 14 (ref 5–15)
BUN: <5 mg/dL — ABNORMAL LOW (ref 8–23)
CO2: 20 mmol/L — ABNORMAL LOW (ref 22–32)
Calcium: 9.3 mg/dL (ref 8.9–10.3)
Chloride: 106 mmol/L (ref 98–111)
Creatinine, Ser: 0.52 mg/dL (ref 0.44–1.00)
GFR calc Af Amer: >60 mL/min (ref >60)
GFR calc non Af Amer: >60 mL/min (ref >60)
Glucose, Bld: 87 mg/dL (ref 70–99)
Potassium: 3.5 mmol/L (ref 3.5–5.1)
Sodium: 140 mmol/L (ref 135–145)

## 2019-11-22 LAB — CBC
HCT: 41.3 % (ref 36.0–46.0)
Hemoglobin: 14 g/dL (ref 12.0–15.0)
MCH: 33.8 pg (ref 26.0–34.0)
MCHC: 33.9 g/dL (ref 30.0–36.0)
MCV: 99.8 fL (ref 80.0–100.0)
Platelets: 206 10*3/uL (ref 150–400)
RBC: 4.14 MIL/uL (ref 3.87–5.11)
RDW: 14 % (ref 11.5–15.5)
WBC: 10.6 10*3/uL — ABNORMAL HIGH (ref 4.0–10.5)
nRBC: 0 % (ref 0.0–0.2)

## 2019-11-22 LAB — GLUCOSE, CAPILLARY
Glucose-Capillary: 107 mg/dL — ABNORMAL HIGH (ref 70–99)
Glucose-Capillary: 85 mg/dL (ref 70–99)
Glucose-Capillary: 106 mg/dL — ABNORMAL HIGH (ref 70–99)

## 2019-11-22 LAB — PHOSPHORUS: Phosphorus: 2.6 mg/dL (ref 2.5–4.6)

## 2019-11-22 MED ORDER — PRO-STAT SUGAR FREE PO LIQD: 30.0000 mL | Freq: Every day | ORAL | Status: DC

## 2019-11-22 MED ORDER — ENOXAPARIN SODIUM 40 MG/0.4ML ~~LOC~~ SOLN
40.0000 mg | SUBCUTANEOUS | Status: DC
  Administered 2019-11-22 – 2019-12-01 (×10): 40 mg via SUBCUTANEOUS
  Filled 2019-11-22 (×10): qty 0.4

## 2019-11-22 MED ORDER — DOCUSATE SODIUM 50 MG/5ML PO LIQD
100.0000 mg | Freq: Two times a day (BID) | ORAL | Status: DC
  Administered 2019-11-22 – 2019-12-01 (×6): 100 mg
  Filled 2019-11-22 (×9): qty 10

## 2019-11-22 MED ORDER — PRO-STAT SUGAR FREE PO LIQD
30.0000 mL | Freq: Two times a day (BID) | ORAL | Status: DC
  Administered 2019-11-22 – 2019-11-29 (×15): 30 mL
  Filled 2019-11-22 (×15): qty 30

## 2019-11-22 NOTE — Progress Notes (Signed)
Transcranial Doppler  Date POD PCO2 HCT BP  MCA ACA PCA OPHT SIPH VERT Basilar  1/4 MR     Right  Left   66  24   -36  *   28  19   25  10    47  35   *  *     *    1/6 MS     Right  Left   *  21   *  -35   *  17   22  22    57  53   *  *   *           Right  Left                                             Right  Left                                             Right  Left                                            Right  Left                                            Right  Left                                        MCA = Middle Cerebral Artery      OPHT = Opthalmic Artery     BASILAR = Basilar Artery   ACA = Anterior Cerebral Artery     SIPH = Carotid Siphon PCA = Posterior Cerebral Artery   VERT = Verterbral Artery                   Normal MCA = 62+\-12 ACA = 50+\-12 PCA = 42+\-23    11/22/2019 12:12 PM , MHA, RVT, RDCS, RDMS

## 2019-11-22 NOTE — Evaluation (Signed)
Occupational Therapy Evaluation Patient Details Name: Olivia Mann MRN: [id number] DOB: [birth year] Today's Date: 11/22/2019    History of Present Illness Pt is a 64 y.o. female PMHx: substance abuse. Pt presenting with headache, new onset seizures, agitation, and AMS. 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 ETT 1/3-1/4.    Clinical Impression    Pt PTA: Pt living in home, unable to get out of her if she lived alone, but reports that she was independent. Pt currently, limited by L hemiplegia, decreased ability to care for self and increased caregiver assist required. Pt following very few commands, only on R side and unable to keep head up in sitting nor able to stabilize self in sitting without assist. Pt totalA for ADL at this time. Bilateral mitts on hands and safety belt applied.  Pt maxA +2 for bed mobility and standing deferred due to weakness. Pt requires continued OT skilled services for vision, ADL and HEP for LUE. OT following acutely.    Follow Up Recommendations  CIR    Equipment Recommendations  Other (comment)(to be determined)    Recommendations for Other Services Rehab consult     Precautions / Restrictions Precautions Precautions: None Restrictions Weight Bearing Restrictions: No      Mobility Bed Mobility Overal bed mobility: Needs Assistance Bed Mobility: Sidelying to Sit;Sit to Supine     Supine to sit: Max assist;+2 for physical assistance Sit to supine: Max assist;+2 for physical assistance   General bed mobility comments: pt with no command follow, pt restless in bed  Transfers                 General transfer comment: pt unsafe at this time    Balance Overall balance assessment: Needs assistance Sitting-balance support: Single extremity supported;Feet supported Sitting balance-Leahy Scale: Poor Sitting balance - Comments: pt unable to hold  head up requiring maxA, pt pushing with R UE toward left Postural control: Left lateral lean;Posterior lean                                 ADL either performed or assessed with clinical judgement   ADL Overall ADL's : Needs assistance/impaired Eating/Feeding: NPO   Grooming: Maximal assistance;Total assistance;Sitting   Upper Body Bathing: Maximal assistance;Total assistance;Sitting   Lower Body Bathing: Total assistance;+2 for physical assistance;+2 for safety/equipment;Sitting/lateral leans;Bed level   Upper Body Dressing : Maximal assistance;Total assistance;Cueing for safety;Cueing for sequencing;Sitting;Bed level   Lower Body Dressing: Total assistance;+2 for physical assistance;+2 for safety/equipment;Cueing for safety;Sitting/lateral leans;Bed level   Toilet Transfer: Total assistance;+2 for physical assistance;+2 for safety/equipment Toilet Transfer Details (indicate cue type and reason): deferred Toileting- Clothing Manipulation and Hygiene: Total assistance;+2 for physical assistance;+2 for safety/equipment;Cueing for safety       Functional mobility during ADLs: Total assistance;+2 for physical assistance;+2 for safety/equipment;Cueing for sequencing;Cueing for safety(only sitting EOB) General ADL Comments: Pt limited by L hemiplegia, decreased ability to care for self and increased caregiver assist required.     Vision Baseline Vision/History: No visual deficits Vision Assessment?: Vision impaired- to be further tested in functional context Additional Comments: Pt unable to keep eyes open log enough and pt following ~25-50% of commands.     Perception     Praxis      Pertinent Vitals/Pain Pain Assessment: Faces Faces  Pain Scale: No hurt Pain Location: pt says she is uncomfortable in the bed Pain Descriptors / Indicators: Discomfort Pain Intervention(s): Repositioned;Monitored during session     Hand Dominance Right   Extremity/Trunk  Assessment Upper Extremity Assessment Upper Extremity Assessment: Generalized weakness LUE Deficits / Details: L hemiplegia LUE Coordination: decreased fine motor;decreased gross motor   Lower Extremity Assessment Lower Extremity Assessment: Defer to PT evaluation   Cervical / Trunk Assessment Cervical / Trunk Assessment: Normal   Communication Communication Communication: Expressive difficulties;Receptive difficulties   Cognition Arousal/Alertness: Lethargic;Suspect due to medications Behavior During Therapy: Restless;Flat affect;Impulsive Overall Cognitive Status: Difficult to assess                                     General Comments  HR increasing to 110 BPM with exertion    Exercises Exercises: Other exercises Other Exercises Other Exercises: PROM to LUE shoulder through digits; flaccidity noted   Shoulder Instructions      Home Living Family/patient expects to be discharged to:: Private residence     Type of Home: House Home Access: Stairs to enter Secretary/administrator of Steps: 1   Home Layout: One level     Bathroom Shower/Tub: Producer, television/film/video: Standard     Home Equipment: None   Additional Comments: Pt drowsy, but able to answer questions      Prior Functioning/Environment Level of Independence: Independent                 OT Problem List: Decreased strength;Decreased range of motion;Decreased activity tolerance;Impaired balance (sitting and/or standing);Impaired vision/perception;Decreased coordination;Decreased safety awareness;Decreased cognition;Impaired UE functional use;Pain      OT Treatment/Interventions: Therapeutic exercise;Self-care/ADL training;Neuromuscular education;Energy conservation;Therapeutic activities;Visual/perceptual remediation/compensation;Cognitive remediation/compensation;Patient/family education;Balance training    OT Goals(Current goals can be found in the care plan section) Acute  Rehab OT Goals Patient Stated Goal: unable to state OT Goal Formulation: Patient unable to participate in goal setting Time For Goal Achievement: 12/06/19 Potential to Achieve Goals: Good ADL Goals Pt Will Perform Eating: with set-up;sitting Pt Will Perform Grooming: with set-up;sitting Pt Will Perform Lower Body Dressing: with mod assist;sitting/lateral leans;sit to/from stand Pt Will Transfer to Toilet: with mod assist;stand pivot transfer;bedside commode Pt/caregiver will Perform Home Exercise Program: Increased strength;Left upper extremity;With written HEP provided Additional ADL Goal #1: Pt will attend to functional tasks x5 mins in 3/3 trials with minimal cues to redirect pt.  OT Frequency: Min 2X/week   Barriers to D/Mann:            Co-evaluation PT/OT/SLP Co-Evaluation/Treatment: Yes Reason for Co-Treatment: Complexity of the patient's impairments (multi-system involvement);For patient/therapist safety   OT goals addressed during session: ADL's and self-care      AM-PAC OT "6 Clicks" Daily Activity     Outcome Measure Help from another person eating meals?: Total Help from another person taking care of personal grooming?: Total Help from another person toileting, which includes using toliet, bedpan, or urinal?: Total Help from another person bathing (including washing, rinsing, drying)?: Total Help from another person to put on and taking off regular upper body clothing?: Total Help from another person to put on and taking off regular lower body clothing?: Total 6 Click Score: 6   End of Session Nurse Communication: Mobility status  Activity Tolerance: Treatment limited secondary to agitation;Treatment limited secondary to medical complications (Comment);Patient limited by lethargy Patient left: in bed;with call bell/phone within reach;with  bed alarm set;with restraints reapplied  OT Visit Diagnosis: Unsteadiness on feet (R26.81);Muscle weakness (generalized) (M62.81)                 Time: 1062-6948 OT Time Calculation (min): 29 min Charges:  OT General Charges $OT Visit: 1 Visit OT Evaluation $OT Eval Moderate Complexity: 1 Mod  Olivia Mann OTR/L Acute Rehabilitation Services Pager: [phone] Office: [phone]   Olivia Mann 11/22/2019, 5:36 PM

## 2019-11-22 NOTE — Progress Notes (Signed)
STROKE TEAM PROGRESS NOTE      INTERVAL HISTORY Patient neurologically stable.  She is drowsy but can be aroused and follows commands on the right side.  She has left hemiplegia   Blood pressure adequately controlled. .  Transcranial Doppler studies are suboptimal but do not show vasospasm OBJECTIVE Vitals:   11/22/19 0800 11/22/19 0900 11/22/19 1000 11/22/19 1200  BP: (!) 142/80 137/88 (!) 139/91   Pulse: 98 80 (!) 108   Resp: (!) 22 (!) 35 (!) 27   Temp: 98.8 F (37.1 C)   98.1 F (36.7 C)  TempSrc: Axillary   Axillary  SpO2: 96% 95% 94%   Weight:      Height:        CBC:  Recent Labs  Lab 11/19/19 0734 11/20/19 0418 11/21/19 0347 11/21/19 0441 11/22/19 0350  WBC 8.2 10.9* 11.9*  --  10.6*  NEUTROABS 5.1 8.9*  --   --   --   HGB 13.8 11.2* 12.7 11.9* 14.0  HCT 41.0 33.5* 37.5 35.0* 41.3  MCV 102.8* 104.0* 102.2*  --  99.8  PLT 201 174 198  --  206    Basic Metabolic Panel:  Recent Labs  Lab 11/20/19 0418 11/21/19 0347 11/21/19 0441 11/22/19 0350  NA 140 138 139 140  K 3.5 3.7 3.5 3.5  CL 113* 106  --  106  CO2 19* 20*  --  20*  GLUCOSE 93 90  --  87  BUN 8 <5*  --  <5*  CREATININE 0.59 0.57  --  0.52  CALCIUM 7.8* 8.6*  --  9.3  MG 1.8 2.2  --   --   PHOS 3.8 1.5*  --  2.6    Lipid Panel:     Component Value Date/Time   CHOL 193 11/20/2019 0418   TRIG 83 11/20/2019 0418   HDL 60 11/20/2019 0418   CHOLHDL 3.2 11/20/2019 0418   VLDL 17 11/20/2019 0418   LDLCALC 116 (H) 11/20/2019 0418   HgbA1c:  Lab Results  Component Value Date   HGBA1C 5.4 11/20/2019   Urine Drug Screen:     Component Value Date/Time   LABOPIA NONE DETECTED 11/19/2019 0028   COCAINSCRNUR NONE DETECTED 11/19/2019 0028   LABBENZ POSITIVE (A) 11/19/2019 0028   AMPHETMU NONE DETECTED 11/19/2019 0028   THCU POSITIVE (A) 11/19/2019 0028   LABBARB NONE DETECTED 11/19/2019 0028    Alcohol Level     Component Value Date/Time   ETH <10 11/18/2019 2135    IMAGING  CT  ANGIO HEAD W OR WO CONTRAST CT ANGIO NECK W OR WO CONTRAST 11/18/2019 IMPRESSION:  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 The aneurysm is 2 to 3 mm proximal to the ICA bifurcation. 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   DG Chest Portable 1 View 11/18/2019 IMPRESSION:  No acute abnormality noted. Endotracheal tube in satisfactory position   DG Abd Portable 1V 11/19/2019 IMPRESSION:  Tip and side port of the enteric tube below the diaphragm in the stomach.   CT HEAD CODE STROKE WO CONTRAST  11/18/2019  IMPRESSION:  Diffuse subarachnoid hemorrhage. 2.2 x 2.4 x 2.9 cm intraparenchymal hematoma in the right anteromedial temporal lobe. Intraventricular communication on the right. This could be a primary intraparenchymal hemorrhage with subarachnoid and intraventricular penetration or the pattern could also  indicate a ruptured aneurysm with an associated intraparenchymal hematoma.  Transcranial Dopplers -11/20/2019 poor suboccipital window limits evaluation. Normal mean flow velocities in majority of identified vessels of anetrior circulation and low left middle cerebral velocities likely dur to suboptimal waveform. No evidence of vasospam noted  ECG -sinus tachycardia.  Ventricular premature complexes.    PHYSICAL EXAM Blood pressure (!) 139/91, pulse (!) 108, temperature 98.1 F (36.7 C), temperature source Axillary, resp. rate (!) 27, height 5\' 1"  (1.549 m), weight 54.1 kg, SpO2 94 %. Frail middle-aged African-American lady not in distress.. . Afebrile. Head is nontraumatic. Neck is supple without bruit.    Cardiac exam no murmur or gallop. Lungs are clear to auscultation. Distal pulses are well felt. Neurological Exam :  Patient is drowsy but can be easily aroused she has post craniotomy dressing on her head.  Eyes are closed.  She has right gaze  deviation.  She does not follow gaze consistently and does not blink to threat on either side.  Fundi not visualized.  Pupils irregular reactive.  She is able to move right upper and lower extremity to command against gravity.  She has dense left hemiplegia but will withdraw the left leg slightly to painful stimuli.  Tone is diminished on the left compared to the right.  Left plantar upgoing right downgoing.  ASSESSMENT/PLAN Ms. Ger Nicks is a 64 y.o. female with no significant PMHX presenting with headache, new onset seizures, agitation, and AMS. She was found to have a SAH as well as an aneurysm that may have ruptured.  She did not receive IV t-PA due to [employer]  Stroke:  Diffuse subarachnoid hemorrhage. 2.2 x 2.4 x 2.9 cm intraparenchymal hematoma in the right anteromedial temporal lobe   Code Stroke CT Head - Diffuse subarachnoid hemorrhage. 2.2 x 2.4 x 2.9 cm intraparenchymal hematoma in the right anteromedial temporal lobe. Intraventricular communication on the right.  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 The aneurysm is 2 to 3 mm proximal to the ICA bifurcation. 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   Transcranial Dopplers - pending  CT Perfusion - not ordered  Carotid Doppler - CTA neck performed - carotid dopplers not indicated.  2D Echo - not indicated  [facility] Virus 2 - negative  LDL -1 1 6  mg percent  HgbA1c -5.4  UDS - THCU and benzodiazepines  VTE prophylaxis - SCDs Diet  Diet Order            Diet NPO time specified  Diet effective now              No antithrombotic prior to admission, now on No antithrombotic  Ongoing aggressive stroke risk factor management  Therapy recommendations: CLR   disposition:   Pending  Hypertension  Home BP meds: none   Current BP meds: Cleviprex  SBP - mildly low . Long-term BP goal normotensive  Hyperlipidemia  Home Lipid lowering medication: none   LDL -1 1 6  mg percent goal < 70  Current lipid lowering medication: none   Continue statin at discharge  Other Stroke Risk Factors  Advanced age  Cigarette smoker - not on file  ETOH use - not on file   Family  hx of aneurysms  Substance Abuse - THCU  Other Active Problems  Seizures - Keppra  SAH / aneurysm - surgery to be discussed with family - Nimodipine  Hypokalemia - 4.3->3.4->3.0 - supplement and recheck  Leukocytosis - 11.6->13.4 (afebrile)  Macrocytic - check B12 and folate  Intubated / sedated  Hospital day # 4   Recommend continue strict blood pressure control with systolic goal below 470, continue nimodipine and check serial transcranial Doppler studies.  Continue Keppra for seizure prophylaxis given abnormal MRI appearance of hippocampal on the left and patient's presentation with seizures she is at high risk for recurrence.Starleen Blue out of bed.  Therapy and rehab consults.  Continue tube feeds via core track.  Hopefully transfer to rehab over the next few days. This patient is critically ill and at significant risk of neurological worsening, death and care requires constant monitoring of vital signs, hemodynamics,respiratory and cardiac monitoring, extensive review of multiple databases, frequent neurological assessment, discussion with family, other specialists and medical decision making of high complexity.I have made any additions or clarifications directly to the above note.This critical care time does not reflect procedure time, or teaching time or supervisory time of PA/NP/Med Resident etc but could involve care discussion time.  I spent 30 minutes of neurocritical care time  in the care of  this patient.     Antony Contras, MD Medical Director [facility] Stroke  Center Pager: [phone] 11/22/2019 2:06 PM        To contact Stroke Continuity provider, please refer to [email or web address] After hours, contact General Neurology

## 2019-11-22 NOTE — Progress Notes (Addendum)
Initial Nutrition Assessment  DOCUMENTATION CODES:      INTERVENTION:  Jevity 1.2 @   ml/hr via NG and increase by 10 ml every 4 hours to goal rate of 50 ml/hr.   Add 30 ml Prostat BID.    Tube feeding regimen provides 1640 kcal, 96 grams of protein, and 968 ml of H2O.     NUTRITION DIAGNOSIS:   Increased nutrient needs related to post-op healing and (acute brain injury) as evidenced by estimated needs.   GOAL:   Provide needs based on ASPEN/SCCM guidelines MONITOR:   TF tolerance, Weight trends, Labs, I & O's, Skin   REASON FOR ASSESSMENT:  Consult(Assessment and Enteral feeding recommendations. Intiate Adult TF Protocol) Assessment of nutrition requirement/status  ASSESSMENT:  RD working remotely.  Patient is a 64 yo female presented on 1/2 following a seizure. Patient confused and agitated on admission. Code stroke called. She was intubated/sedated 1/2 for airway protection and treatment. CT findings: SAH. Patient also has 47mm PComm aneurysm.  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 Pcom with left hemiplegia.  -1/4 pt extubated on 4 liters nasal cannula   -1/5 ST evaluation- NPO recommended. Cortrak tube placed. Patient pulling lines/tubes, increased HR. Ativan administered.  Tube feeding of Jevity 1.2 started and goal rate of 50 ml/hr.    Medications reviewed and include: Colace, Protonix.   Available weight history reviewed. Limited data.   Intake/Output Summary (Last 24 hours) at 11/22/2019 1123 Last data filed at 11/22/2019 0900 Gross per 24 hour  Intake 2254.01 ml  Output 600 ml  Net 1654.01 ml   Since admission:  +5.742  Labs: BMP Latest Ref Rng & Units 11/22/2019 11/21/2019 11/21/2019  Glucose 70 - 99 mg/dL 87 - 90  BUN 8 - 23 mg/dL <1(Y) - <7(W)  Creatinine 0.44 - 1.00 mg/dL 2.95 - 6.21  Sodium 308 - 145 mmol/L 140 139 138   Potassium 3.5 - 5.1 mmol/L 3.5 3.5 3.7  Chloride 98 - 111 mmol/L 106 - 106  CO2 22 - 32 mmol/L 20(L) - 20(L)  Calcium 8.9 - 10.3 mg/dL 9.3 - 8.6(L)     NUTRITION - FOCUSED PHYSICAL EXAM:  Unable to complete Nutrition-Focused physical exam at this time.  RD working remotely.  Diet Order:   Diet Order            Diet NPO time specified  Diet effective now              EDUCATION NEEDS:   Not appropriate for education at this time Skin:  Skin Assessment: Skin Integrity Issues: Skin Integrity Issues:: Incisions Incisions: surgical incision-staples with mininal sanguineous drainage  Last BM:  1/5- type 6 small  Height:   Ht Readings from Last 1 Encounters:  11/18/19 5\' 1"  (1.549 m)    Weight:   Wt Readings from Last 1 Encounters:  11/22/19 54.1 kg    Ideal Body Weight:  48 kg  BMI:  Body mass index is 22.54 kg/m.  Estimated Nutritional Needs:   Kcal:  01/20/20  Protein:  81-97 gr  Fluid:  >1500 ml daily   [id number] MS,RD,CSG,LDN Office: [phone] Pager: [phone]

## 2019-11-22 NOTE — Consult Note (Signed)
Physical Medicine and Rehabilitation Consult Reason for Consult: Decreased functional mobility Referring Physician: Dr. Kathyrn Sheriff   HPI: Olivia Mann is a 64 y.o. right-handed female with unremarkable past medical history.  History taken from chart review and son due to mental condition. Per chart review independent prior to admission.  Presented 11/18/2019 with witnessed seizure after complaints of headache. Cranial CT showed diffuse SAH, 2 x 2 x 2 0.4 x 2.9 cm intraparenchymal hematoma.  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  The aneurysm was 2 to 3 mm proximal to the ICA bifurcation.  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  Patient underwent right frontotemporal craniotomy for clipping of internal carotid artery aneurysm as well as evacuation of right temporal hematoma on 11/19/2019 per Dr. Kathyrn Sheriff.  Maintained on Keppra for seizure prophylaxis.  Subcutaneous Lovenox added for DVT prophylaxis 11/22/2019.  Patient currently NPO with alternative means of nutritional support.   Review of Systems  Unable to perform ROS: Acuity of condition   History reviewed. No pertinent past medical history., unable to obtain from patient.  Past Surgical History:  Procedure Laterality Date  . CRANIOTOMY Right 11/19/2019   Procedure: CRANIOTOMY CLIPPING OF CAROTID ANEURYSM;  Surgeon: Consuella Lose, MD;  Location: [facility];  Service: Neurosurgery;  Laterality: Right;   History reviewed. No pertinent family history., unable to obtain from patient.  Social History:  has no history on file for tobacco, alcohol, and drug.,unable to obtain from patient.  Allergies: No Known Allergies No medications prior to admission.   Home: Home Living Family/patient expects to be discharged to:: Private residence Type of Home: House Home  Access: Stairs to enter Technical brewer of Steps: 1 Home Layout: One level Bathroom Shower/Tub: Multimedia programmer: Standard Home Equipment: None Additional Comments: Pt drowsy, but able to answer questions  Functional History: Prior Function Level of Independence: Independent Functional Status:  Mobility: Bed Mobility Overal bed mobility: Needs Assistance Bed Mobility: Supine to Sit, Sit to Supine Supine to sit: Max assist, +2 for physical assistance Sit to supine: Max assist, +2 for physical assistance General bed mobility comments: pt with no command follow, pt restless in bed Transfers General transfer comment: pt unsafe at this time      ADL: ADL Overall ADL's : Needs assistance/impaired Eating/Feeding: NPO Grooming: Maximal assistance, Total assistance, Sitting Upper Body Bathing: Maximal assistance, Total assistance, Sitting Lower Body Bathing: Total assistance, +2 for physical assistance, +2 for safety/equipment, Sitting/lateral leans, Bed level Upper Body Dressing : Maximal assistance, Total assistance, Cueing for safety, Cueing for sequencing, Sitting, Bed level Lower Body Dressing: Total assistance, +2 for physical assistance, +2 for safety/equipment, Cueing for safety, Sitting/lateral leans, Bed level Toilet Transfer: Total assistance, +2 for physical assistance, +2 for safety/equipment Toilet Transfer Details (indicate cue type and reason): deferred Toileting- Clothing Manipulation and Hygiene: Total assistance, +2 for physical assistance, +2 for safety/equipment, Cueing for safety Functional mobility during ADLs: Total assistance, +2 for physical assistance, +2 for safety/equipment, Cueing for sequencing, Cueing for safety(only sitting EOB)  Cognition: Cognition Overall Cognitive Status: Difficult to assess Orientation Level: Oriented to person, Disoriented to situation, Disoriented to time, Oriented to place Cognition Arousal/Alertness:  Lethargic, Suspect due to medications Behavior During Therapy: Restless, Flat affect, Impulsive Overall Cognitive Status: Difficult to assess Difficult to  assess due to: Level of arousal  Blood pressure (!) 139/91, pulse (!) 108, temperature 98.1 F (36.7 C), temperature source Axillary, resp. rate (!) 27, height 5\' 1"  (1.549 m), weight 54.1 kg, SpO2 94 %. Physical Exam  Vitals reviewed. Constitutional: She appears well-developed and well-nourished.  HENT:  Head: Normocephalic and atraumatic.  +NG  Eyes: EOM are normal. Right eye exhibits no discharge. Left eye exhibits no discharge.  Neck: No tracheal deviation present. No thyromegaly present.  Respiratory:  Increased work of breathing Wet sounding  GI: Soft. She exhibits no distension.  Musculoskeletal:     Comments: No edema or tenderness in extremities  Neurological:  Patient is very lethargic, unable to arouse Seen freely moving RUE, no other movement noted  Skin: Skin is warm and dry.  Psychiatric:  Unable to assess due lethargy/mentation    Results for orders placed or performed during the hospital encounter of 11/18/19 (from the past 24 hour(s))  Basic metabolic panel     Status: Abnormal   Collection Time: 11/22/19  3:50 AM  Result Value Ref Range   Sodium 140 135 - 145 mmol/L   Potassium 3.5 3.5 - 5.1 mmol/L   Chloride 106 98 - 111 mmol/L   CO2 20 (L) 22 - 32 mmol/L   Glucose, Bld 87 70 - 99 mg/dL   BUN <5 (L) 8 - 23 mg/dL   Creatinine, Ser 01/20/20 0.44 - 1.00 mg/dL   Calcium 9.3 8.9 - 0.98 mg/dL   GFR calc non Af Amer >60 >60 mL/min   GFR calc Af Amer >60 >60 mL/min   Anion gap 14 5 - 15  CBC     Status: Abnormal   Collection Time: 11/22/19  3:50 AM  Result Value Ref Range   WBC 10.6 (H) 4.0 - 10.5 K/uL   RBC 4.14 3.87 - 5.11 MIL/uL   Hemoglobin 14.0 12.0 - 15.0 g/dL   HCT 01/20/20 14.7 - 82.9 %   MCV 99.8 80.0 - 100.0 fL   MCH 33.8 26.0 - 34.0 pg   MCHC 33.9 30.0 - 36.0 g/dL   RDW 56.2 13.0 - 86.5 %    Platelets 206 150 - 400 K/uL   nRBC 0.0 0.0 - 0.2 %  Phosphorus     Status: None   Collection Time: 11/22/19  3:50 AM  Result Value Ref Range   Phosphorus 2.6 2.5 - 4.6 mg/dL  Glucose, capillary     Status: Abnormal   Collection Time: 11/22/19 11:41 AM  Result Value Ref Range   Glucose-Capillary 107 (H) 70 - 99 mg/dL   Comment 1 Notify RN    Comment 2 Document in Chart    DG Chest Port 1 View  Result Date: 11/21/2019 CLINICAL DATA:  Seizures EXAM: PORTABLE CHEST 1 VIEW COMPARISON:  Radiograph 11/20/2019 FINDINGS: Some streaky opacity seen in the left infrahilar lung. No pneumothorax or effusion. No convincing features of edema. The cardiomediastinal contours are unremarkable. No acute osseous or soft tissue abnormality. IMPRESSION: Streaky opacities in the left infrahilar lung could reflect atelectasis or sequela of aspiration. Electronically Signed   By: 01/18/2020 M.D.   On: 11/21/2019 05:51   VAS 01/19/2020 TRANSCRANIAL DOPPLER  Result Date: 11/22/2019  Transcranial Doppler Indications: Subarachnoid hemorrhage. Limitations: Patient position and poor patient cooperation. Bandaging. Limitations for diagnostic windows: Unable to insonate right transtemporal window. Unable to insonate occipital window. Comparison Study: 11/22/2019- TCD Performing Technologist: 01/20/2020 MHA, RDMS, RVT, RDCS  Examination Guidelines: A complete evaluation includes  B-mode imaging, spectral Doppler, color Doppler, and power Doppler as needed of all accessible portions of each vessel. Bilateral testing is considered an integral part of a complete examination. Limited examinations for reoccurring indications may be performed as noted.  +----------+-------------+----------+-----------+------------------+ RIGHT TCD Right VM (cm)Depth (cm)Pulsatility     Comment       +----------+-------------+----------+-----------+------------------+ MCA                                         Unable to insonate  +----------+-------------+----------+-----------+------------------+ ACA                                         Unable to insonate +----------+-------------+----------+-----------+------------------+ Term ICA                                    Unable to insonate +----------+-------------+----------+-----------+------------------+ PCA                                         Unable to insonate +----------+-------------+----------+-----------+------------------+ Opthalmic     22.00                 1.24                       +----------+-------------+----------+-----------+------------------+ ICA siphon    57.00                 0.52                       +----------+-------------+----------+-----------+------------------+ Vertebral                                   Unable to insonate +----------+-------------+----------+-----------+------------------+  +----------+------------+----------+-----------+------------------+ LEFT TCD  Left VM (cm)Depth (cm)Pulsatility     Comment       +----------+------------+----------+-----------+------------------+ MCA          21.00                 1.03                       +----------+------------+----------+-----------+------------------+ ACA          -35.00                1.10                       +----------+------------+----------+-----------+------------------+ Term ICA                                   Unable to insonate +----------+------------+----------+-----------+------------------+ PCA          17.00                 1.22                       +----------+------------+----------+-----------+------------------+ Opthalmic    22.00  1.14                       +----------+------------+----------+-----------+------------------+ ICA siphon   53.00                 0.99                       +----------+------------+----------+-----------+------------------+ Vertebral                                   Unable to insonate +----------+------------+----------+-----------+------------------+  +------------+-------+------------------+             VM cm/s     Comment       +------------+-------+------------------+ Prox Basilar       Unable to insonate +------------+-------+------------------+    Preliminary     Assessment/Plan: Diagnosis: Right temporal lobe parenchymal hematoma with ?extension vs infarct Labs and images (see above) independently reviewed.  Records reviewed and summated above.  1. Does the need for close, 24 hr/day medical supervision in concert with the patient's rehab needs make it unreasonable for this patient to be served in a less intensive setting? Yes 2. Co-Morbidities requiring supervision/potential complications: tachypnea (monitor RR and O2 Sats with increased physical exertion), Tachycardia (monitor in accordance with pain and increasing activity), HTN (monitor and provide prns in accordance with increased physical exertion and pain), leukocytosis (repeat labs, cont to monitor for signs and symptoms of infection, further workup if indicated) 3. Due to bladder management, bowel management, safety, skin/wound care, disease management, medication administration and patient education, does the patient require 24 hr/day rehab nursing? Yes 4. Does the patient require coordinated care of a physician, rehab nurse, therapy disciplines of PT/OT/SLP to address physical and functional deficits in the context of the above medical diagnosis(es)? Yes Addressing deficits in the following areas: balance, endurance, locomotion, strength, transferring, bathing, dressing, feeding, grooming, toileting, cognition, speech, language, swallowing and psychosocial support 5. Can the patient actively participate in an intensive therapy program of at least 3 hrs of therapy per day at least 5 days per week? Potentially 6. The potential for patient to make measurable gains while on  inpatient rehab is excellent 7. Anticipated functional outcomes upon discharge from inpatient rehab are TBD  with PT, TBD with OT, TBD with SLP. 8. Estimated rehab length of stay to reach the above functional goals is: TBD. 9. Anticipated discharge destination: TBD. 10. Overall Rehab/Functional Prognosis: excellent  RECOMMENDATIONS: This patient's condition is appropriate for continued rehabilitative care in the following setting: Will need to await improvement in medical condition.  Currently, unable to follow commands, however, this is unlikely current level of functioning.  Likely CIR if caregiver support available upon discharge.  Per son, family considering relocation of patient to [place]. Patient has agreed to participate in recommended program. Potentially Note that insurance prior authorization may be required for reimbursement for recommended care.  Comment: Rehab Admissions Coordinator to follow up.  I have personally performed a face to face diagnostic evaluation, including, but not limited to relevant history and physical exam findings, of this patient and developed relevant assessment and plan.  Additionally, I have reviewed and concur with the physician assistant's documentation above.   Maryla Morrow, MD, ABPMR Mcarthur Rossetti Angiulli, PA-C 11/22/2019

## 2019-11-22 NOTE — Evaluation (Signed)
Physical Therapy Evaluation Patient Details Name: Olivia Mann MRN: [id number] DOB: 23-Jul-1956 Today's Date: 11/22/2019   History of Present Illness  Pt is a 64 y.o. female with no significant PMHX presenting with headache, new onset seizures, agitation, and AMS. 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 ETT 1/3-1/4. No known PMH    Clinical Impression  Pt admitted with above. Pt currently restless, confused with L hemiplegia. Pt indep PTA now requiring maxA for mobility. Pt to benefit from CIR Upon d/c for maximal functional and cognitive recovery. Acute PT to cont to follow.    Follow Up Recommendations CIR    Equipment Recommendations  (TBD at next venue)    Recommendations for Other Services Rehab consult     Precautions / Restrictions Precautions Precautions: None Restrictions Weight Bearing Restrictions: No      Mobility  Bed Mobility Overal bed mobility: Needs Assistance Bed Mobility: Supine to Sit;Sit to Supine     Supine to sit: Max assist;+2 for physical assistance Sit to supine: Max assist;+2 for physical assistance   General bed mobility comments: pt with no command follow, pt restless in bed  Transfers                 General transfer comment: pt unsafe at this time  Ambulation/Gait                Stairs            Wheelchair Mobility    Modified Rankin (Stroke Patients Only) Modified Rankin (Stroke Patients Only) Pre-Morbid Rankin Score: No symptoms Modified Rankin: Severe disability     Balance Overall balance assessment: Needs assistance Sitting-balance support: Single extremity supported;Feet supported Sitting balance-Leahy Scale: Poor Sitting balance - Comments: pt unable to hold head up requiring maxA, pt pushing with R UE toward left Postural control: Left lateral lean;Posterior lean                                    Pertinent Vitals/Pain Pain Assessment: Faces Faces Pain Scale: No hurt Pain Location: pt says she is uncomfortable in the bed Pain Descriptors / Indicators: Discomfort Pain Intervention(s): Repositioned    Home Living Family/patient expects to be discharged to:: Private residence     Type of Home: House Home Access: Stairs to enter   Technical brewer of Steps: 1 Home Layout: One level Home Equipment: None Additional Comments: Pt drowsy, but able to answer questions    Prior Function Level of Independence: Independent               Hand Dominance   Dominant Hand: Right    Extremity/Trunk Assessment   Upper Extremity Assessment Upper Extremity Assessment: Generalized weakness;LUE deficits/detail LUE Deficits / Details: L hemiplegia LUE Coordination: decreased fine motor;decreased gross motor    Lower Extremity Assessment Lower Extremity Assessment: LLE deficits/detail LLE Deficits / Details: no active movement, flaccidity    Cervical / Trunk Assessment Cervical / Trunk Assessment: Normal  Communication   Communication: Expressive difficulties;Receptive difficulties  Cognition Arousal/Alertness: Lethargic;Suspect due to medications Behavior During Therapy: Restless;Flat affect;Impulsive Overall Cognitive Status: Difficult to assess  General Comments General comments (skin integrity, edema, etc.): VSS    Exercises     Assessment/Plan    PT Assessment Patient needs continued PT services  PT Problem List Decreased strength;Decreased activity tolerance;Decreased coordination;Decreased cognition;Decreased knowledge of use of DME       PT Treatment Interventions DME instruction;Gait training;Stair training;Functional mobility training;Therapeutic activities;Therapeutic exercise;Balance training    PT Goals (Current goals can be found in the Care Plan section)  Acute Rehab PT  Goals PT Goal Formulation: Patient unable to participate in goal setting Time For Goal Achievement: 12/06/19 Potential to Achieve Goals: Fair    Frequency Min 4X/week   Barriers to discharge        Co-evaluation PT/OT/SLP Co-Evaluation/Treatment: Yes Reason for Co-Treatment: Complexity of the patient's impairments (multi-system involvement)   OT goals addressed during session: ADL's and self-care       AM-PAC PT "6 Clicks" Mobility  Outcome Measure Help needed turning from your back to your side while in a flat bed without using bedrails?: A Lot Help needed moving from lying on your back to sitting on the side of a flat bed without using bedrails?: A Lot Help needed moving to and from a bed to a chair (including a wheelchair)?: Total Help needed standing up from a chair using your arms (e.g., wheelchair or bedside chair)?: Total Help needed to walk in hospital room?: Total Help needed climbing 3-5 steps with a railing? : Total 6 Click Score: 8    End of Session   Activity Tolerance: Patient tolerated treatment well Patient left: in bed;with call bell/phone within reach;with restraints reapplied;with bed alarm set(posey, R wrist restraints, and mitt3en) Nurse Communication: Mobility status PT Visit Diagnosis: Unsteadiness on feet (R26.81);Muscle weakness (generalized) (M62.81);Difficulty in walking, not elsewhere classified (R26.2);Hemiplegia and hemiparesis Hemiplegia - Right/Left: Left Hemiplegia - dominant/non-dominant: Non-dominant Hemiplegia - caused by: Nontraumatic intracerebral hemorrhage    Time: 1610-9604 PT Time Calculation (min) (ACUTE ONLY): 14 min   Charges:   PT Evaluation $PT Eval Moderate Complexity: 1 Mod          Lewis Shock, PT, DPT Acute Rehabilitation Services Pager #: [phone] Office #: [phone]   Iona Hansen 11/22/2019, 1:44 PM

## 2019-11-22 NOTE — Progress Notes (Signed)
Rehab Admissions Coordinator Note:  Per PT recommendation, patient was screened by Stephania Fragmin for appropriateness for an Inpatient Acute Rehab Consult.  At this time, we are recommending Inpatient Rehab consult.  I will place an order so we can evaluate this patient.   Stephania Fragmin 11/22/2019, 2:15 PM  I can be reached at [phone].

## 2019-11-22 NOTE — Progress Notes (Signed)
  Speech Language Pathology Treatment: Dysphagia  Patient Details Name: Olivia Mann MRN: [id number] DOB: 1956-04-27 Today's Date: 11/22/2019 Time: 2707-8675 SLP Time Calculation (min) (ACUTE ONLY): 14 min  Assessment / Plan / Recommendation Clinical Impression  Pt is a little more alert today but remains drowsy, altered and actually has increased audible wetness at baseline that is concerning for decreased secretion management. Anterior spillage is noted; pt does not hold her head fully upright. Delayed coughing is observed and noted to be weak, as is her volitional cough. Neither seems to clear her wet vocal quality. Would remain NPO with use of cortrak.   HPI HPI: Pt is a 64 y.o. female with no significant PMHX presenting with headache, new onset seizures, agitation, and AMS. 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 ETT 1/3-1/4. No known PMH.      SLP Plan  Continue with current plan of care       Recommendations  Diet recommendations: NPO Medication Administration: Via alternative means                Oral Care Recommendations: Oral care QID Follow up Recommendations: Inpatient Rehab SLP Visit Diagnosis: Dysphagia, oropharyngeal phase (R13.12) Plan: Continue with current plan of care       GO                 Mahala Menghini., M.A. CCC-SLP Acute Rehabilitation Services Pager [phone] Office [phone]  11/22/2019, 12:13 PM

## 2019-11-23 LAB — BASIC METABOLIC PANEL
Anion gap: 14 (ref 5–15)
BUN: 7 mg/dL — ABNORMAL LOW (ref 8–23)
CO2: 20 mmol/L — ABNORMAL LOW (ref 22–32)
Calcium: 8.9 mg/dL (ref 8.9–10.3)
Chloride: 106 mmol/L (ref 98–111)
Creatinine, Ser: 0.42 mg/dL — ABNORMAL LOW (ref 0.44–1.00)
GFR calc Af Amer: >60 mL/min (ref >60)
GFR calc non Af Amer: >60 mL/min (ref >60)
Glucose, Bld: 108 mg/dL — ABNORMAL HIGH (ref 70–99)
Potassium: 2.8 mmol/L — ABNORMAL LOW (ref 3.5–5.1)
Sodium: 140 mmol/L (ref 135–145)

## 2019-11-23 LAB — CBC
HCT: 38.8 % (ref 36.0–46.0)
Hemoglobin: 13.5 g/dL (ref 12.0–15.0)
MCH: 33.5 pg (ref 26.0–34.0)
MCHC: 34.8 g/dL (ref 30.0–36.0)
MCV: 96.3 fL (ref 80.0–100.0)
Platelets: 236 10*3/uL (ref 150–400)
RBC: 4.03 MIL/uL (ref 3.87–5.11)
RDW: 13.3 % (ref 11.5–15.5)
WBC: 7.3 10*3/uL (ref 4.0–10.5)
nRBC: 0 % (ref 0.0–0.2)

## 2019-11-23 LAB — TYPE AND SCREEN
ABO/RH(D): AB POS
Antibody Screen: NEGATIVE
Unit division: 0
Unit division: 0
Unit division: 0
Unit division: 0

## 2019-11-23 LAB — BPAM RBC
Blood Product Expiration Date: 202101202359
Blood Product Expiration Date: 202101262359
Blood Product Expiration Date: 202101262359
Blood Product Expiration Date: 202101262359
Unit Type and Rh: 6200
Unit Type and Rh: 6200
Unit Type and Rh: 6200
Unit Type and Rh: 6200

## 2019-11-23 LAB — GLUCOSE, CAPILLARY
Glucose-Capillary: 79 mg/dL (ref 70–99)
Glucose-Capillary: 123 mg/dL — ABNORMAL HIGH (ref 70–99)
Glucose-Capillary: 101 mg/dL — ABNORMAL HIGH (ref 70–99)
Glucose-Capillary: 113 mg/dL — ABNORMAL HIGH (ref 70–99)
Glucose-Capillary: 113 mg/dL — ABNORMAL HIGH (ref 70–99)

## 2019-11-23 NOTE — Evaluation (Signed)
Speech Language Pathology Evaluation Patient Details Name: Olivia Mann MRN: [id number] DOB: 1956-07-21 Today's Date: 11/23/2019 Time: 6767-2094 SLP Time Calculation (min) (ACUTE ONLY): 12 min  Problem List:  Patient Active Problem List   Diagnosis Date Noted  . SAH (subarachnoid hemorrhage) (HCC)   . Tachypnea   . Tachycardia   . Hypertension   . Leukocytosis   . Cytotoxic brain edema (HCC) 11/20/2019  . Seizures (HCC) 11/20/2019  . Subarachnoid hemorrhage (HCC)   . Endotracheally intubated   . Macrocytosis without anemia   . Encephalopathy acute   . Subarachnoid hematoma (HCC) 11/18/2019   Past Medical History: History reviewed. No pertinent past medical history. Past Surgical History:  Past Surgical History:  Procedure Laterality Date  . CRANIOTOMY Right 11/19/2019   Procedure: CRANIOTOMY CLIPPING OF CAROTID ANEURYSM;  Surgeon: Lisbeth Renshaw, MD;  Location: [facility] OR;  Service: Neurosurgery;  Laterality: Right;   HPI:  Pt is a 64 y.o. female with no significant PMHX presenting with headache, new onset seizures, agitation, and AMS. She was found to have a SAH (2.2 x 2.4 x 2.9 cm in R anteromedial temporal lobe) as well as an aneurysm that may have ruptured s/p clipping 1/3. ETT 1/3-1/4. No known PMH.   Assessment / Plan / Recommendation Clinical Impression  Pt was seen for a cognitive-linguistic evaluation in the setting of a SAH.  Although she was lethargic, she was agreeable to the evaluation.  Pt was oriented to herself, the place, and the city given extra time.  She was unable to state the year, but she was able to correctly identify it given a choice of two.  Pt followed basic one-step commands given extra time and she answered yes/no questions with 80% accuracy.  Attention to functional tasks was diminished, but she was able to repeat a 5 digit sequence given extra time.  Safety judgement appeared to be intact.  Unable to evaluate additional cognitive-linguistic abilities at  this time secondary to lethargy.  Recommend additional ST (Inpatient Rehab) at time of discharge.  SLP will f/u for diagnostic treatment per POC.      SLP Assessment  SLP Recommendation/Assessment: Patient needs continued Speech Lanaguage Pathology Services SLP Visit Diagnosis: Cognitive communication deficit (R41.841)    Follow Up Recommendations  Inpatient Rehab    Frequency and Duration min 2x/week  2 weeks      SLP Evaluation Cognition  Overall Cognitive Status: Difficult to assess Arousal/Alertness: Lethargic Orientation Level: Oriented to person;Oriented to place;Disoriented to situation Attention: Sustained;Focused Focused Attention: Impaired Focused Attention Impairment: Functional complex Sustained Attention: Appears intact Memory: (Unable to evalute ) Awareness: (Unable to evalute ) Problem Solving: (Unable to evalute ) Executive Function: (Unable to evalute ) Safety/Judgment: Appears intact       Comprehension  Auditory Comprehension Overall Auditory Comprehension: Impaired Yes/No Questions: Impaired Basic Biographical Questions: 76-100% accurate Complex Questions: 50-74% accurate Commands: Within Functional Limits Conversation: Simple EffectiveTechniques: Extra processing time    Expression Expression Primary Mode of Expression: Verbal Verbal Expression Overall Verbal Expression: (difficult to assess ) Level of Generative/Spontaneous Verbalization: Phrase Repetition: No impairment Naming: Not tested Interfering Components: Attention   Oral / Motor  Oral Motor/Sensory Function Overall Oral Motor/Sensory Function: Moderate impairment Facial ROM: Reduced left;Suspected CN VII (facial) dysfunction Facial Symmetry: Abnormal symmetry left;Suspected CN VII (facial) dysfunction Facial Strength: Reduced left;Suspected CN VII (facial) dysfunction Lingual Symmetry: Within Functional Limits Motor Speech Overall Motor Speech: Appears within functional limits for  tasks assessed   GO  Olivia Mann 11/23/2019, 3:54 PM

## 2019-11-23 NOTE — Progress Notes (Signed)
Physical Therapy Treatment Patient Details Name: Olivia Mann MRN: [id number] DOB: [birth year] Today's Date: 11/23/2019    History of Present Illness Pt is a 64 y.o. female PMHx: substance abuse. Pt presenting with headache, new onset seizures, agitation, and AMS. 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 ETT 1/3-1/4.     PT Comments    Pt easily aroused and she responded quietly.  Pt asked to go to the bathroom.  Emphasized getting to the [facility], with transition via roll to the L and up to sitting, sitting balance, sit to stand and transfer to/from the [facility] including standing for peri care.    Follow Up Recommendations  CIR     Equipment Recommendations  Other (comment)(TBD next venue)    Recommendations for Other Services Rehab consult     Precautions / Restrictions Precautions Precautions: None;Fall    Mobility  Bed Mobility Overal bed mobility: Needs Assistance Bed Mobility: Supine to Sit;Sit to Supine     Supine to sit: Max assist;+2 for physical assistance Sit to supine: Max assist;+2 for physical assistance   General bed mobility comments: cued for direction, pt did not consistently follow 1-step commands.  Attempted to get pt to use UE's  to scoot using hand over hand assist   Transfers Overall transfer level: Needs assistance   Transfers: Sit to/from Stand;Stand Pivot Transfers Sit to Stand: Max assist;+2 safety/equipment Stand pivot transfers: Max assist;+2 safety/equipment       General transfer comment: face to face assist forward and up with guarding and blocking of L > R LE. to/from [facility]  Ambulation/Gait                 Stairs             Wheelchair Mobility    Modified Rankin (Stroke Patients Only) Modified Rankin (Stroke Patients Only) Modified Rankin: Severe disability     Balance Overall balance assessment: Needs assistance    Sitting balance-Leahy Scale: Poor Sitting balance - Comments: moderate assist to sit EOB, pt was able to attain upright posture with assist   Standing balance support: Single extremity supported;No upper extremity supported Standing balance-Leahy Scale: Poor Standing balance comment: face to face assist with guarding statically for 45  during peri care.                            Cognition Arousal/Alertness: Lethargic;Awake/alert Behavior During Therapy: Flat affect;Restless Overall Cognitive Status: Difficult to assess                                        Exercises Other Exercises Other Exercises: warm up hip/knee flex/ext AROM with resisted gross extension x10.    General Comments General comments (skin integrity, edema, etc.): vss      Pertinent Vitals/Pain Pain Assessment: Faces Faces Pain Scale: No hurt Pain Intervention(s): Monitored during session    Home Living       Type of Home: House              Prior Function            PT Goals (current goals can now be found in the care plan section) Acute Rehab PT Goals Patient Stated Goal: unable to state  PT Goal Formulation: Patient unable to participate in goal setting Time For Goal Achievement: 12/06/19 Potential to Achieve Goals: Fair Progress towards PT goals: Progressing toward goals    Frequency    Min 4X/week      PT Plan Current plan remains appropriate    Co-evaluation              AM-PAC PT "6 Clicks" Mobility   Outcome Measure  Help needed turning from your back to your side while in a flat bed without using bedrails?: A Lot Help needed moving from lying on your back to sitting on the side of a flat bed without using bedrails?: Total Help needed moving to and from a bed to a chair (including a wheelchair)?: Total Help needed standing up from a chair using your arms (e.g., wheelchair or bedside chair)?: Total Help needed to walk in hospital room?:  Total Help needed climbing 3-5 steps with a railing? : Total 6 Click Score: 7    End of Session   Activity Tolerance: Patient tolerated treatment well Patient left: in bed;with call bell/phone within reach;with restraints reapplied;with bed alarm set Nurse Communication: Mobility status PT Visit Diagnosis: Unsteadiness on feet (R26.81);Muscle weakness (generalized) (M62.81);Difficulty in walking, not elsewhere classified (R26.2);Hemiplegia and hemiparesis Hemiplegia - Right/Left: Left Hemiplegia - dominant/non-dominant: Non-dominant Hemiplegia - caused by: Nontraumatic intracerebral hemorrhage     Time: 5427-0623 PT Time Calculation (min) (ACUTE ONLY): 36 min  Charges:  $Therapeutic Activity: 8-22 mins $Neuromuscular Re-education: 8-22 mins                     11/23/2019  Ginger Carne., PT Acute Rehabilitation Services [phone]  (pager) [phone]  (office)   Tessie Fass Shakti Fleer 11/23/2019, 5:43 PM

## 2019-11-23 NOTE — Progress Notes (Signed)
STROKE TEAM PROGRESS NOTE      INTERVAL HISTORY Patient neurologically unchanged and stable.  She is drowsy but can be aroused and follows commands on the right side.  She has persistent left hemiplegia   Blood pressure adequately controlled. .    OBJECTIVE Vitals:   11/23/19 1200 11/23/19 1300 11/23/19 1400 11/23/19 1500  BP: (!) 144/95 138/83 135/90 (!) 142/99  Pulse: 96 92 92 (!) 104  Resp: (!) 26 16 18  (!) 23  Temp:      TempSrc:      SpO2: 98% 99% 100% 99%  Weight:      Height:        CBC:  Recent Labs  Lab 11/19/19 0734 11/20/19 0418 11/22/19 0350 11/23/19 0652  WBC 8.2 10.9* 10.6* 7.3  NEUTROABS 5.1 8.9*  --   --   HGB 13.8 11.2* 14.0 13.5  HCT 41.0 33.5* 41.3 38.8  MCV 102.8* 104.0* 99.8 96.3  PLT 201 174 206 236    Basic Metabolic Panel:  Recent Labs  Lab 11/20/19 0418 11/21/19 0347 11/22/19 0350 11/23/19 0652  NA 140 138 140 140  K 3.5 3.7 3.5 2.8*  CL 113* 106 106 106  CO2 19* 20* 20* 20*  GLUCOSE 93 90 87 108*  BUN 8 <5* <5* 7*  CREATININE 0.59 0.57 0.52 0.42*  CALCIUM 7.8* 8.6* 9.3 8.9  MG 1.8 2.2  --   --   PHOS 3.8 1.5* 2.6  --     Lipid Panel:     Component Value Date/Time   CHOL 193 11/20/2019 0418   TRIG 83 11/20/2019 0418   HDL 60 11/20/2019 0418   CHOLHDL 3.2 11/20/2019 0418   VLDL 17 11/20/2019 0418   LDLCALC 116 (H) 11/20/2019 0418   HgbA1c:  Lab Results  Component Value Date   HGBA1C 5.4 11/20/2019   Urine Drug Screen:     Component Value Date/Time   LABOPIA NONE DETECTED 11/19/2019 0028   COCAINSCRNUR NONE DETECTED 11/19/2019 0028   LABBENZ POSITIVE (A) 11/19/2019 0028   AMPHETMU NONE DETECTED 11/19/2019 0028   THCU POSITIVE (A) 11/19/2019 0028   LABBARB NONE DETECTED 11/19/2019 0028    Alcohol Level     Component Value Date/Time   ETH <10 11/18/2019 2135    IMAGING  CT ANGIO HEAD W OR WO CONTRAST CT ANGIO NECK W OR WO CONTRAST 11/18/2019 IMPRESSION:  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 The aneurysm is 2 to 3 mm proximal to the ICA bifurcation. 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   DG Chest Portable 1 View 11/18/2019 IMPRESSION:  No acute abnormality noted. Endotracheal tube in satisfactory position   DG Abd Portable 1V 11/19/2019 IMPRESSION:  Tip and side port of the enteric tube below the diaphragm in the stomach.   CT HEAD CODE STROKE WO CONTRAST  11/18/2019  IMPRESSION:  Diffuse subarachnoid hemorrhage. 2.2 x 2.4 x 2.9 cm intraparenchymal hematoma in the right anteromedial temporal lobe. Intraventricular communication on the right. This could be a primary intraparenchymal hemorrhage with subarachnoid and intraventricular penetration or the pattern could also indicate a ruptured aneurysm with an associated intraparenchymal hematoma.  Transcranial Dopplers -11/20/2019 poor suboccipital window limits evaluation. Normal mean flow velocities in majority of identified vessels of anetrior circulation and low left middle cerebral velocities likely dur to suboptimal  waveform. No evidence of vasospam noted  ECG -sinus tachycardia.  Ventricular premature complexes.    PHYSICAL EXAM Blood pressure (!) 142/99, pulse (!) 104, temperature 97.7 F (36.5 C), temperature source Axillary, resp. rate (!) 23, height 5\' 1"  (1.549 m), weight 54 kg, SpO2 99 %. Frail middle-aged African-American lady not in distress.. . Afebrile. Head is nontraumatic. Neck is supple without bruit.    Cardiac exam no murmur or gallop. Lungs are clear to auscultation. Distal pulses are well felt. Neurological Exam :  Patient is drowsy but can be easily aroused she has post craniotomy dressing on her head.  Eyes are closed.  She has right gaze deviation.  She does not follow gaze consistently and does not blink to threat on either side.  Fundi not visualized.  Pupils irregular  reactive.  She is able to move right upper and lower extremity to command against gravity.  She has dense left hemiplegia but will withdraw the left leg slightly to painful stimuli.  Tone is diminished on the left compared to the right.  Left plantar upgoing right downgoing.  ASSESSMENT/PLAN [name] is a 64 y.o. female with no significant PMHX presenting with headache, new onset seizures, agitation, and AMS. She was found to have a SAH as well as an aneurysm that may have ruptured.  She did not receive IV t-PA due to [facility].  Stroke:  Diffuse subarachnoid hemorrhage. 2.2 x 2.4 x 2.9 cm intraparenchymal hematoma in the right anteromedial temporal lobe Left hemiplegia post clipping likely from right subcortical infarct related to surgery. No vasopsam on TCDs  Code Stroke CT Head - Diffuse subarachnoid hemorrhage. 2.2 x 2.4 x 2.9 cm intraparenchymal hematoma in the right anteromedial temporal lobe. Intraventricular communication on the right.  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 The aneurysm is 2 to 3 mm proximal to the ICA bifurcation. 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   Transcranial Dopplers - suboptimal but no vasospasm  CT Perfusion - not ordered  Carotid Doppler - CTA neck performed - carotid dopplers not indicated.  2D Echo - not indicated  [employer] Virus 2 - negative  LDL -1 1 6  mg percent  HgbA1c -5.4  UDS - [facility] and benzodiazepines  VTE prophylaxis - SCDs Diet  Diet Order            Diet NPO time specified  Diet effective now              No antithrombotic prior to admission, now on No antithrombotic  Ongoing aggressive stroke risk factor management  Therapy recommendations: CLR   disposition:   Pending  Hypertension  Home BP meds: none   Current BP meds: Cleviprex  SBP - mildly low . Long-term BP goal normotensive  Hyperlipidemia  Home Lipid lowering medication: none   LDL -1 1 6  mg percent goal < 70  Current lipid lowering medication: none   Continue statin at discharge  Other Stroke Risk Factors  Advanced age  Cigarette smoker - not on file  ETOH use - not on file   Family hx of aneurysms  Substance Abuse - THCU  Other Active Problems  Seizures - Keppra  SAH / aneurysm - surgery  to be discussed with family - Nimodipine  Hypokalemia - 4.3->3.4->3.0 - supplement and recheck  Leukocytosis - 11.6->13.4 (afebrile)  Macrocytic - check B12 and folate  Intubated / sedated  Hospital day # 5   Recommend continue strict blood pressure control with systolic goal below 160, continue nimodipine      Continue Keppra for seizure prophylaxis given abnormal MRI appearance of hippocampal on the left and patient's presentation with seizures she is at high risk for recurrence.Rober Minion out of bed.  Therapy and rehab consults.  Continue tube feeds via core track.  Hopefully transfer to rehab over the next few days. D/w husband at bedside and answered questions. This patient is critically ill and at significant risk of neurological worsening, death and care requires constant monitoring of vital signs, hemodynamics,respiratory and cardiac monitoring, extensive review of multiple databases, frequent neurological assessment, discussion with family, other specialists and medical decision making of high complexity.I have made any additions or clarifications directly to the above note.This critical care time does not reflect procedure time, or teaching time or supervisory time of PA/NP/Med Resident etc but could involve care discussion time.  I spent 30 minutes of neurocritical care time  in the care of  this patient.     Delia Heady, MD Medical Director [facility] Pager: [phone] 11/23/2019 3:46 PM        To contact Stroke Continuity provider, please refer to [email or web address]. After hours, contact General Neurology

## 2019-11-23 NOTE — Progress Notes (Signed)
Inpatient Rehabilitation-Admissions Coordinator   Met with pt bedside as follow up from PM&R consult. While pt was able to follow most motor commands on the right side she was very distracted, restless, and reaching for items that were not there. Pt's son was bedside and able to answer PLOF questions. We began to have a conversation regarding post acute rehab. Informed pt and her son of recommended CIR program. Pt's son did express an interest in being closer to [place] where they are from but was open to discussion of rehab here as well. We discussed anticipated LOS and expected assist required at DC after an IP Rehab stay. Pt's son wants to present the information to his siblings to see what type of assistance is available for her at DC. As it is believed that the pt does not have insurance, [facility] will contact financial counselor to assist with this case.   Will continue to have follow up discussions with family to determine venue preference and caregiver assistance at DC.   Please call if questions.   Raechel Ache, OTR/L  Rehab Admissions Coordinator  [phone] 11/23/2019 2:52 PM

## 2019-11-23 NOTE — Progress Notes (Signed)
  Speech Language Pathology Treatment: Dysphagia  Patient Details Name: Olivia Mann MRN: [id number] DOB: 09-Jun-1956 Today's Date: 11/23/2019 Time: 0174-9449 SLP Time Calculation (min) (ACUTE ONLY): 8 min  Assessment / Plan / Recommendation Clinical Impression  Pt was encountered asleep in bed with her son present at bedside.  She roused intermittently to max verbal and tactile stimulation; however, she kept her eyes closed throughout this tx session.  Pt was observed to have wet vocal quality at baseline and she was unable to clear it with a cued cough secondary to weakness and reported head pain during cough.  SLP completed oral care with suction swab and pt tolerated without difficulty.  She was seen with minimal trials of ice chips and thin liquid.  Pt with passive bolus acceptance and decreased lingual manipulation was observed despite max verbal cues.  Unable to determine if pt triggered a swallow with either trial secondary to positioning, but she stated that she swallowed both the ice chip and thin liquid bolus.  Pt with a delayed throat clear following the ice chip trial and a delayed, wet cough following the thin liquid trial.  Wet vocal quality was observed to increase following trials.  No additional PO trials were attempted on this date secondary to aspiration risk.  Recommend that pt remain NPO at this time with frequent oral care and alternative means of nutrition.  Spoke with son regarding recommendations and treatment plan.  SLP will continue to f/u to determine readiness for clinical diet initiation vs instrumental swallow study.     HPI HPI: Pt is a 64 y.o. female with no significant PMHX presenting with headache, new onset seizures, agitation, and AMS. 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 ETT 1/3-1/4. No known PMH.      SLP Plan  Continue with current plan of care        Recommendations  Diet recommendations: NPO Medication Administration: Via alternative means                Oral Care Recommendations: Oral care QID Follow up Recommendations: Inpatient Rehab SLP Visit Diagnosis: Dysphagia, oropharyngeal phase (R13.12) Plan: Continue with current plan of care                      Villa Herb M.S., CCC-SLP Acute Rehabilitation Services Office: [phone]  Shanon Rosser [facility] 11/23/2019, 3:33 PM

## 2019-11-24 ENCOUNTER — Inpatient Hospital Stay (HOSPITAL_COMMUNITY): Payer: Self-pay

## 2019-11-24 DIAGNOSIS — I609 Nontraumatic subarachnoid hemorrhage, unspecified: Secondary | ICD-10-CM

## 2019-11-24 LAB — GLUCOSE, CAPILLARY
Glucose-Capillary: 123 mg/dL — ABNORMAL HIGH (ref 70–99)
Glucose-Capillary: 138 mg/dL — ABNORMAL HIGH (ref 70–99)
Glucose-Capillary: 109 mg/dL — ABNORMAL HIGH (ref 70–99)
Glucose-Capillary: 121 mg/dL — ABNORMAL HIGH (ref 70–99)
Glucose-Capillary: 105 mg/dL — ABNORMAL HIGH (ref 70–99)
Glucose-Capillary: 110 mg/dL — ABNORMAL HIGH (ref 70–99)

## 2019-11-24 MED ORDER — OXYCODONE HCL 5 MG/5ML PO SOLN
5.0000 mg | ORAL | Status: DC | PRN
  Administered 2019-11-24 – 2019-11-25 (×3): 10 mg
  Administered 2019-11-25 (×2): 5 mg
  Administered 2019-11-25 – 2019-11-26 (×5): 10 mg
  Administered 2019-11-27: 09:00:00 5 mg
  Administered 2019-11-27 – 2019-12-01 (×18): 10 mg
  Filled 2019-11-24: qty 10
  Filled 2019-11-24: qty 5
  Filled 2019-11-24 (×6): qty 10
  Filled 2019-11-24: qty 5
  Filled 2019-11-24 (×15): qty 10
  Filled 2019-11-24: qty 5
  Filled 2019-11-24 (×5): qty 10

## 2019-11-24 NOTE — Progress Notes (Signed)
Physical Therapy Treatment Patient Details Name: Olivia Mann MRN: [id number] DOB: [birth year] Today's Date: 11/24/2019    History of Present Illness Pt is a 64 y.o. female PMHx: substance abuse. Pt presenting with headache, new onset seizures, agitation, and AMS. 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 ETT 1/3-1/4.     PT Comments    Pt admitted for above. She demonstrated improvement today in functional mobility by performing bed mobility with mod assist +2 and transfers mod/max assist +2. She continues to demonstrate L-sided weakness but demonstrated increased use of L hip extensors during activities today, particularly with weight shifting from L to R in standing. She required facilitation and cueing during weight shifting activity as well as L knee block. She continues to need blocking of L knee during transfers and fatigued during the third sit to stand. She also demonstrates some active movement of L shoulder, but none observed in more distal UE and LE joints. Pt required suctioning throughout session for oral secretions. Son present and helpful during session to gain pt's attention. Pt would continue to benefit from continued skilled PT intervention to address deficits.   Follow Up Recommendations  CIR     Equipment Recommendations  Other (comment)(TBD next venue)    Recommendations for Other Services Rehab consult     Precautions / Restrictions Precautions Precautions: Fall Restrictions Weight Bearing Restrictions: No    Mobility  Bed Mobility Overal bed mobility: Needs Assistance Bed Mobility: Supine to Sit     Supine to sit: +2 for physical assistance;Mod assist     General bed mobility comments: cued for sequencing, hand placement; increased time and effort; pt used R UE on rails appropriately but difficulty elevating trunk due to weakness; managed BLE on her own but R  LE pushed L LE off EOB  Transfers Overall transfer level: Needs assistance Equipment used: 2 person hand held assist Transfers: Sit to/from [employer] Sit to Stand: Mod assist;+2 physical assistance;Max assist Stand pivot transfers: Max assist;+2 physical assistance       General transfer comment: pt powered up to stand with mod assist and lean toward R, blocked L knee; standing pivot transfer max assist for sequencing, advancing the L LE towards recliner and to hold her upright; L weakness throughout; once in recliner, completed 2x sit to stands, one with mod A +2 and mod weight shifting, 1 with max assist+2 with less weight shifting ability  Ambulation/Gait                 Stairs             Wheelchair Mobility    Modified Rankin (Stroke Patients Only)       Balance Overall balance assessment: Needs assistance Sitting-balance support: Single extremity supported;Feet supported Sitting balance-Leahy Scale: Poor Sitting balance - Comments: moderate assist to sit EOB, pt was able to attain upright posture with assist; heavy lean toward L in sitting Postural control: Left lateral lean;Posterior lean Standing balance support: Single extremity supported;No upper extremity supported;During functional activity Standing balance-Leahy Scale: Poor Standing balance comment: blocked L LE throughout standing; could not stand without external support                            Cognition Arousal/Alertness: Lethargic Behavior During Therapy: WFL for tasks assessed/performed Overall Cognitive Status:  Impaired/Different from baseline Area of Impairment: Attention;Following commands;Safety/judgement;Problem solving                   Current Attention Level: Sustained   Following Commands: Follows one step commands inconsistently;Follows one step commands with increased time Safety/Judgement: Decreased awareness of safety;Decreased awareness  of deficits   Problem Solving: Slow processing;Difficulty sequencing;Requires verbal cues;Requires tactile cues;Decreased initiation General Comments: pt lethargic throughout but was fixated on suction tubing; increased time to follow commands and some decreased safety awareness      Exercises General Exercises - Lower Extremity Ankle Circles/Pumps: Right;10 reps;Supine;AROM Quad Sets: Right;AROM;10 reps;Supine    General Comments        Pertinent Vitals/Pain Pain Assessment: No/denies pain Pain Intervention(s): Monitored during session;Limited activity within patient's tolerance;Repositioned    Home Living                      Prior Function            PT Goals (current goals can now be found in the care plan section) Acute Rehab PT Goals Patient Stated Goal: unable to state PT Goal Formulation: Patient unable to participate in goal setting Time For Goal Achievement: 12/06/19 Potential to Achieve Goals: Fair Progress towards PT goals: Progressing toward goals    Frequency    Min 4X/week      PT Plan Current plan remains appropriate    Co-evaluation              AM-PAC PT "6 Clicks" Mobility   Outcome Measure  Help needed turning from your back to your side while in a flat bed without using bedrails?: A Lot Help needed moving from lying on your back to sitting on the side of a flat bed without using bedrails?: Total Help needed moving to and from a bed to a chair (including a wheelchair)?: Total Help needed standing up from a chair using your arms (e.g., wheelchair or bedside chair)?: A Lot Help needed to walk in hospital room?: Total Help needed climbing 3-5 steps with a railing? : Total 6 Click Score: 8    End of Session Equipment Utilized During Treatment: Gait belt Activity Tolerance: Patient tolerated treatment well;Patient limited by lethargy Patient left: in chair;with call bell/phone within reach;with chair alarm set;with  family/visitor present Nurse Communication: Mobility status PT Visit Diagnosis: Unsteadiness on feet (R26.81);Muscle weakness (generalized) (M62.81);Difficulty in walking, not elsewhere classified (R26.2);Hemiplegia and hemiparesis Hemiplegia - Right/Left: Left Hemiplegia - dominant/non-dominant: Non-dominant Hemiplegia - caused by: Nontraumatic intracerebral hemorrhage     Time: 1346-1418 PT Time Calculation (min) (ACUTE ONLY): 32 min  Charges:  $Therapeutic Activity: 8-22 mins $Neuromuscular Re-education: 8-22 mins                     Christel Mormon, SPT    Symantha Steeber 11/24/2019, 3:10 PM

## 2019-11-24 NOTE — Progress Notes (Signed)
Transcranial Doppler  Date POD PCO2 HCT BP  MCA ACA PCA OPHT SIPH VERT Basilar  1/4 MR     Right  Left   66  24   -36  *   28  19   25  10    47  35   *  *     *    1/6 MS     Right  Left   *  21   *  -35   *  17   22  22    57  53   *  *   *      1/8 MS     Right  Left   *  *   *  *   *  *   24  22   27   43   *  *   *            Right  Left                                             Right  Left                                            Right  Left                                            Right  Left                                        MCA = Middle Cerebral Artery      OPHT = Opthalmic Artery     BASILAR = Basilar Artery   ACA = Anterior Cerebral Artery     SIPH = Carotid Siphon PCA = Posterior Cerebral Artery   VERT = Verterbral Artery                   Normal MCA = 62+\-12 ACA = 50+\-12 PCA = 42+\-23   *Unable to insonate due to poor patient cooperation and bandages.  11/24/2019 12:36 PM , MHA, RVT, RDCS, RDMS

## 2019-11-24 NOTE — Progress Notes (Signed)
  Speech Language Pathology Treatment: Dysphagia  Patient Details Name: Olivia Mann MRN: [id number] DOB: 16-Jan-1956 Today's Date: 11/24/2019 Time: 1030-1046 SLP Time Calculation (min) (ACUTE ONLY): 16 min  Assessment / Plan / Recommendation Clinical Impression  Followed up for PO trials, pt lethargy persists, however pt with increased arousal with multimodal cues. At baseline, pt exhibited reduced management of saliva as well as congested breath sounds. Completed diligent oral care, with suction of pooling secretions of oral cavity. Pt with leaning of head to the right, improved with additional support from pillows, repositioning. Administered single ice chip via teaspoon. Pt with some bolus manipulation, delayed AP transit, suspected delay in swallow initiation with what appeared to be weaker delayed reflexive swallow per palpation. Delayed cough exhibited, concerning for reduced airway protection. Pt not exhibiting readiness for POs at this time. Will follow up for continued skilled assessment by SLP, future readiness for instrumental assessment.       HPI HPI: Pt is a 64 y.o. female with no significant PMHX presenting with headache, new onset seizures, agitation, and AMS. 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 ETT 1/3-1/4. No known PMH.      SLP Plan  Continue with current plan of care       Recommendations  Diet recommendations: NPO Medication Administration: Via alternative means                Oral Care Recommendations: Oral care QID Follow up Recommendations: Inpatient Rehab SLP Visit Diagnosis: Dysphagia, unspecified (R13.10) Plan: Continue with current plan of care       GO                Olivia Mann Olivia Aynslee Mulhall MA, CCC-SLP Acute Rehabilitation Services  11/24/2019, 11:06 AM

## 2019-11-24 NOTE — Progress Notes (Signed)
STROKE TEAM PROGRESS NOTE      INTERVAL HISTORY Patient is awake today and interactive.  She has right gaze preference is dysarthric but can be understood.  She has persistent left hemiplegia   Blood pressure adequately controlled.  TCD's are suboptimal due to poor windows. .    OBJECTIVE Vitals:   11/24/19 1300 11/24/19 1400 11/24/19 1500 11/24/19 1535  BP: (!) 160/97 (!) 137/95 (!) 138/97 (!) 138/97  Pulse: 76 96 87 88  Resp: (!) 28 (!) 27 20 20   Temp:      TempSrc:      SpO2: 100% 97% 98% 98%  Weight:      Height:        CBC:  Recent Labs  Lab 11/19/19 0734 11/20/19 0418 11/22/19 0350 11/23/19 0652  WBC 8.2 10.9* 10.6* 7.3  NEUTROABS 5.1 8.9*  --   --   HGB 13.8 11.2* 14.0 13.5  HCT 41.0 33.5* 41.3 38.8  MCV 102.8* 104.0* 99.8 96.3  PLT 201 174 206 009    Basic Metabolic Panel:  Recent Labs  Lab 11/20/19 0418 11/21/19 0347 11/22/19 0350 11/23/19 0652  NA 140 138 140 140  K 3.5 3.7 3.5 2.8*  CL 113* 106 106 106  CO2 19* 20* 20* 20*  GLUCOSE 93 90 87 108*  BUN 8 <5* <5* 7*  CREATININE 0.59 0.57 0.52 0.42*  CALCIUM 7.8* 8.6* 9.3 8.9  MG 1.8 2.2  --   --   PHOS 3.8 1.5* 2.6  --     Lipid Panel:     Component Value Date/Time   CHOL 193 11/20/2019 0418   TRIG 83 11/20/2019 0418   HDL 60 11/20/2019 0418   CHOLHDL 3.2 11/20/2019 0418   VLDL 17 11/20/2019 0418   LDLCALC 116 (H) 11/20/2019 0418   HgbA1c:  Lab Results  Component Value Date   HGBA1C 5.4 11/20/2019   Urine Drug Screen:     Component Value Date/Time   LABOPIA NONE DETECTED 11/19/2019 0028   COCAINSCRNUR NONE DETECTED 11/19/2019 0028   LABBENZ POSITIVE (A) 11/19/2019 0028   AMPHETMU NONE DETECTED 11/19/2019 0028   THCU POSITIVE (A) 11/19/2019 0028   LABBARB NONE DETECTED 11/19/2019 0028    Alcohol Level     Component Value Date/Time   ETH <10 11/18/2019 2135    IMAGING  CT ANGIO HEAD W OR WO CONTRAST CT ANGIO NECK W OR WO CONTRAST 11/18/2019 IMPRESSION:  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 The aneurysm is 2 to 3 mm proximal to the ICA bifurcation. 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   DG Chest Portable 1 View 11/18/2019 IMPRESSION:  No acute abnormality noted. Endotracheal tube in satisfactory position   DG Abd Portable 1V 11/19/2019 IMPRESSION:  Tip and side port of the enteric tube below the diaphragm in the stomach.   CT HEAD CODE STROKE WO CONTRAST  11/18/2019  IMPRESSION:  Diffuse subarachnoid hemorrhage. 2.2 x 2.4 x 2.9 cm intraparenchymal hematoma in the right anteromedial temporal lobe. Intraventricular communication on the right. This could be a primary intraparenchymal hemorrhage with subarachnoid and intraventricular penetration or the pattern could also indicate a ruptured aneurysm with an associated intraparenchymal hematoma.  Transcranial Dopplers -11/20/2019 poor suboccipital window limits evaluation. Normal mean flow velocities in majority of identified vessels of anetrior circulation and low left  middle cerebral velocities likely dur to suboptimal waveform. No evidence of vasospam noted  ECG -sinus tachycardia.  Ventricular premature complexes.    PHYSICAL EXAM Blood pressure (!) 138/97, pulse 88, temperature 97.8 F (36.6 C), temperature source Oral, resp. rate 20, height 5\' 1"  (1.549 m), weight 53.8 kg, SpO2 98 %. Frail middle-aged African-American lady not in distress.. . Afebrile. Head is nontraumatic. Neck is supple without bruit.    Cardiac exam no murmur or gallop. Lungs are clear to auscultation. Distal pulses are well felt. Neurological Exam :  Patient is awake and interactive.  She has post craniotomy dressing on her head.  Eyes are open.  She has right gaze deviation.  She does not follow gaze consistently  Fundi not visualized.  Pupils irregular reactive.  She is able to move right  upper and lower extremity to command against gravity.  She has dense left hemiplegia but will withdraw the left leg slightly to painful stimuli.  Tone is diminished on the left compared to the right.  Left plantar upgoing right downgoing.  ASSESSMENT/PLAN Olivia Mann is a 64 y.o. female with no significant PMHX presenting with headache, new onset seizures, agitation, and AMS. She was found to have a SAH as well as an aneurysm that may have ruptured.  She did not receive IV t-PA due to [facility].  Stroke:  Diffuse subarachnoid hemorrhage. 2.2 x 2.4 x 2.9 cm intraparenchymal hematoma in the right anteromedial temporal lobe Left hemiplegia post clipping likely from right subcortical infarct related to surgery. No vasopsam on TCDs  Code Stroke CT Head - Diffuse subarachnoid hemorrhage. 2.2 x 2.4 x 2.9 cm intraparenchymal hematoma in the right anteromedial temporal lobe. Intraventricular communication on the right.  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 The aneurysm is 2 to 3 mm proximal to the ICA bifurcation. 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   Transcranial Dopplers - suboptimal but no vasospasm  CT Perfusion - not ordered  Carotid Doppler - CTA neck performed - carotid dopplers not indicated.  2D Echo - not indicated  [facility] Virus 2 - negative  LDL -1 1 6  mg percent  HgbA1c -5.4  UDS - THCU and benzodiazepines  VTE prophylaxis - SCDs Diet  Diet Order            Diet NPO time specified  Diet effective now              No antithrombotic prior to admission, now on No antithrombotic  Ongoing aggressive stroke risk factor management  Therapy recommendations: CLR   disposition:  Pending  Hypertension  Home BP meds: none   Current BP  meds: Cleviprex  SBP - mildly low . Long-term BP goal normotensive  Hyperlipidemia  Home Lipid lowering medication: none   LDL -1 1 6  mg percent goal < 70  Current lipid lowering medication: none   Continue statin at discharge  Other Stroke Risk Factors  Advanced age  Cigarette smoker - not on file  ETOH use - not on file   Family hx of aneurysms  Substance Abuse - THCU  Other Active Problems  Seizures - Keppra  SAH / aneurysm - surgery to be discussed with family -  Nimodipine  Hypokalemia - 4.3->3.4->3.0 - supplement and recheck  Leukocytosis - 11.6->13.4 (afebrile)  Macrocytic - check B12 and folate  Intubated / sedated  Hospital day # 6   Recommend continue strict blood pressure control with systolic goal below 160, continue nimodipine      Continue Keppra for seizure prophylaxis.  Continue physical occupational therapy and transfer to inpatient rehab when bed available.  Discontinued TCD studies as she has suboptimal windows and they are mostly nondiagnostic.Rober Minion out of bed.  T  Continue tube feeds via core track.  Hopefully transfer to rehab over the next few days.  Stroke team will sign off.  Kindly call for questions. No family at bedside today. This patient is critically ill and at significant risk of neurological worsening, death and care requires constant monitoring of vital signs, hemodynamics,respiratory and cardiac monitoring, extensive review of multiple databases, frequent neurological assessment, discussion with family, other specialists and medical decision making of high complexity.I have made any additions or clarifications directly to the above note.This critical care time does not reflect procedure time, or teaching time or supervisory time of PA/NP/Med Resident etc but could involve care discussion time.  I spent 30 minutes of neurocritical care time  in the care of  this patient.   Delia Heady, MD  Delia Heady, MD Medical  Director [facility] Stroke Center Pager: [phone] 11/24/2019 4:03 PM        To contact Stroke Continuity provider, please refer to [email or web address]. After hours, contact General Neurology

## 2019-11-24 NOTE — Progress Notes (Signed)
  NEUROSURGERY PROGRESS NOTE   No issues overnight.  No concerns this am.  EXAM:  BP 137/87   Pulse 94   Temp 98.2 F (36.8 C) (Oral)   Resp (!) 25   Ht 5\' 1"  (1.549 m)   Wt 53.8 kg   SpO2 97%   BMI 22.41 kg/m   Awake, alert, oriented to self, location but not year Speech slow but appropriate Follows commands with right side, appears grossly normal No movement to command left side Wound c/d/i  IMPRESSION/PLAN 64 y.o. 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 Stable neurologically. - continue supportive care, nimotop - TCD today.  - Continue PT/OT/SLP

## 2019-11-25 LAB — GLUCOSE, CAPILLARY
Glucose-Capillary: 115 mg/dL — ABNORMAL HIGH (ref 70–99)
Glucose-Capillary: 96 mg/dL (ref 70–99)
Glucose-Capillary: 115 mg/dL — ABNORMAL HIGH (ref 70–99)
Glucose-Capillary: 86 mg/dL (ref 70–99)
Glucose-Capillary: 102 mg/dL — ABNORMAL HIGH (ref 70–99)
Glucose-Capillary: 111 mg/dL — ABNORMAL HIGH (ref 70–99)

## 2019-11-25 MED ORDER — LEVETIRACETAM 100 MG/ML PO SOLN
1000.0000 mg | Freq: Two times a day (BID) | ORAL | Status: DC
  Administered 2019-11-25 – 2019-12-01 (×14): 1000 mg
  Filled 2019-11-25 (×15): qty 10

## 2019-11-25 NOTE — Progress Notes (Signed)
  NEUROSURGERY PROGRESS NOTE   Pt seen and examined. No issues overnight.   EXAM: Temp:  [97.8 F (36.6 C)-98.5 F (36.9 C)] 98.5 F (36.9 C) (01/09 0800) Pulse Rate:  [61-96] 61 (01/09 0900) Resp:  [14-28] 17 (01/09 0900) BP: (126-160)/(66-114) 128/80 (01/09 0900) SpO2:  [97 %-100 %] 97 % (01/09 0900) Weight:  [54.8 kg] 54.8 kg (01/09 0500) Intake/Output      01/08 0701 - 01/09 0700 01/09 0701 - 01/10 0700   I.V. (mL/kg) 1748.1 (31.9) 225 (4.1)   NG/GT 1440 300   IV Piggyback 200    Total Intake(mL/kg) 3388.1 (61.8) 525 (9.6)   Urine (mL/kg/hr)     Other     Stool     Total Output     Net +3388.1 +525        Urine Occurrence 4 x    Stool Occurrence 2 x     Awake, alert, oriented Speech fluent Good strength RUE/RLE No voluntary movement LUE/LLE Wound c/d/i  LABS: Lab Results  Component Value Date   CREATININE 0.42 (L) 11/23/2019   BUN 7 (L) 11/23/2019   NA 140 11/23/2019   K 2.8 (L) 11/23/2019   CL 106 11/23/2019   CO2 20 (L) 11/23/2019   Lab Results  Component Value Date   WBC 7.3 11/23/2019   HGB 13.5 11/23/2019   HCT 38.8 11/23/2019   MCV 96.3 11/23/2019   PLT 236 11/23/2019    IMPRESSION: - 64 y.o. 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 Remains left hemiplegic without signs of spasm.  - Poor TCD windows  PLAN: - Cont monitoring in ICU, likely tx to stepdown tomorrow with plans for CIR next week - Cont Nimotop

## 2019-11-26 LAB — GLUCOSE, CAPILLARY
Glucose-Capillary: 107 mg/dL — ABNORMAL HIGH (ref 70–99)
Glucose-Capillary: 110 mg/dL — ABNORMAL HIGH (ref 70–99)
Glucose-Capillary: 124 mg/dL — ABNORMAL HIGH (ref 70–99)
Glucose-Capillary: 125 mg/dL — ABNORMAL HIGH (ref 70–99)
Glucose-Capillary: 99 mg/dL (ref 70–99)

## 2019-11-26 MED ORDER — GUAIFENESIN 100 MG/5ML PO SOLN
5.0000 mL | ORAL | Status: DC | PRN
  Filled 2019-11-26: qty 15

## 2019-11-26 MED ORDER — PHENOL 1.4 % MT LIQD
1.0000 | OROMUCOSAL | Status: DC | PRN
  Administered 2019-11-26: 1 via OROMUCOSAL
  Filled 2019-11-26: qty 177

## 2019-11-26 MED ORDER — DM-GUAIFENESIN ER 30-600 MG PO TB12
1.0000 | ORAL_TABLET | Freq: Two times a day (BID) | ORAL | Status: DC
  Filled 2019-11-26: qty 1

## 2019-11-26 MED ORDER — GUAIFENESIN 100 MG/5ML PO SOLN
5.0000 mL | ORAL | Status: DC | PRN
  Administered 2019-11-26 – 2019-12-01 (×6): 100 mg
  Filled 2019-11-26 (×5): qty 10

## 2019-11-26 NOTE — Progress Notes (Signed)
SLP Cancellation Note  Patient Details Name: Corayma Cashatt MRN: [id number] DOB: [birth year]   Cancelled treatment:        ICU RN requested re-evaluation of swallow function as pt was requesting POs; pt since transferred to progressive. On SLP arrival, pt had recently received sedating medications.  SLP will reattempt as schedule permits.   Kerrie Pleasure, MA, CCC-SLP Acute Rehabilitation Services Office: [phone] 11/26/2019, 2:45 PM

## 2019-11-26 NOTE — Progress Notes (Signed)
Patient transferred to 4NP05 with all belongings. Notified daughter, Rowe Clack, of transfer and updated her with patient status.   Per Emelia Salisbury, Rowe Clack, will be the only visitor since she lives in [place]. There will be no more alternating of visitors.

## 2019-11-26 NOTE — Progress Notes (Signed)
  NEUROSURGERY PROGRESS NOTE   Pt seen and examined. No issues overnight. C/o HA this am.  EXAM: Temp:  [98.2 F (36.8 C)-99.4 F (37.4 C)] 98.5 F (36.9 C) (01/10 0800) Pulse Rate:  [59-88] 76 (01/10 0700) Resp:  [12-22] 22 (01/10 0700) BP: (116-159)/(62-109) 116/82 (01/10 0700) SpO2:  [94 %-98 %] 97 % (01/10 0700) Weight:  [56.5 kg] 56.5 kg (01/10 0500) Intake/Output      01/09 0701 - 01/10 0700 01/10 0701 - 01/11 0700   I.V. (mL/kg) 1850.9 (32.8)    NG/GT 800    IV Piggyback     Total Intake(mL/kg) 2650.9 (46.9)    Net +2650.9         Urine Occurrence 3 x    Stool Occurrence 2 x     Awake, alert, oriented Speech fluent Moves RUE/RLE well Left hemiplegia Wound c/d/i  LABS: Lab Results  Component Value Date   CREATININE 0.42 (L) 11/23/2019   BUN 7 (L) 11/23/2019   NA 140 11/23/2019   K 2.8 (L) 11/23/2019   CL 106 11/23/2019   CO2 20 (L) 11/23/2019   Lab Results  Component Value Date   WBC 7.3 11/23/2019   HGB 13.5 11/23/2019   HCT 38.8 11/23/2019   MCV 96.3 11/23/2019   PLT 236 11/23/2019    IMPRESSION: - 64 y.o. female SAH d# 8 s/p clipping right Pcom, neurologically stable with left hemiplegia, no signs of spasm.  PLAN: - Can transfer to SDU today - Plan on CIR early this week. - Cont Nimotop to complete 21d.

## 2019-11-27 ENCOUNTER — Inpatient Hospital Stay (HOSPITAL_COMMUNITY): Payer: Self-pay

## 2019-11-27 LAB — GLUCOSE, CAPILLARY
Glucose-Capillary: 100 mg/dL — ABNORMAL HIGH (ref 70–99)
Glucose-Capillary: 110 mg/dL — ABNORMAL HIGH (ref 70–99)
Glucose-Capillary: 111 mg/dL — ABNORMAL HIGH (ref 70–99)
Glucose-Capillary: 118 mg/dL — ABNORMAL HIGH (ref 70–99)
Glucose-Capillary: 126 mg/dL — ABNORMAL HIGH (ref 70–99)
Glucose-Capillary: 127 mg/dL — ABNORMAL HIGH (ref 70–99)
Glucose-Capillary: 92 mg/dL (ref 70–99)
Glucose-Capillary: 95 mg/dL (ref 70–99)

## 2019-11-27 NOTE — Progress Notes (Signed)
  NEUROSURGERY PROGRESS NOTE   No issues overnight. Daughter at bedside today.  EXAM:  BP (!) 142/83   Pulse 74   Temp (!) 97.3 F (36.3 C) (Oral)   Resp 17   Ht 5\' 1"  (1.549 m)   Wt 59.3 kg   SpO2 100%   BMI 24.70 kg/m   Awake, alert, oriented  Speech fluent, appropriate  CN grossly intact  Moves right side well, left hemiplegic Incision: c/d/i, soft bogginess surrounding incision   IMPRESSION/PLAN 64 y.o. female  SAH d#9 s/p clipping right Pcom, neurologically stable with left hemiplegia, no signs of spasm. 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

## 2019-11-27 NOTE — Progress Notes (Signed)
  Speech Language Pathology Treatment: Dysphagia;Cognitive-Linquistic  Patient Details Name: Olivia Mann MRN: [id number] DOB: 06-15-1956 Today's Date: 11/27/2019 Time: 0901-0920 SLP Time Calculation (min) (ACUTE ONLY): 19 min  Assessment / Plan / Recommendation Clinical Impression  Pt demonstrates mild improvements today, with SLP noting mildly improved alertness and sustained attention to simple, familiar tasks. She also does not have as much pooling of saliva - perhaps a result of her increased alertness. Pt needed Max cues to attend to her left environment, although interacted with the left side of her mouth with more automaticity. She did not bring her gaze past midline to find her spoon and then when brought to her right side she had lost her sustained attention and could not problem solve to find it there either despite Max cues. Pt continues have immediate coughing with small amounts of water, although less so with ice chips. Throughout the session pt reported a headache, which likely acted as an internal distractor for her. RN planned to give pain meds after session which may increase her lethargy, which makes me concerned about fluctuating performance for swallowing. I do not think she is ready for a swallow study yet, but I think she is getting closer. SLP will continue to follow for readiness. Recommend CIR for fu post-discharge.    HPI HPI: Pt is a 64 y.o. female with no significant PMHX presenting with headache, new onset seizures, agitation, and AMS. 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 ETT 1/3-1/4. No known PMH.      SLP Plan  Continue with current plan of care       Recommendations  Diet recommendations: NPO Medication Administration: Via alternative means                Oral Care Recommendations: Oral care QID Follow up Recommendations: Inpatient Rehab SLP Visit  Diagnosis: Dysphagia, unspecified (R13.10);Cognitive communication deficit (R41.841) Plan: Continue with current plan of care       GO                 Mahala Menghini., M.A. CCC-SLP Acute Rehabilitation Services Pager [phone] Office [phone]  11/27/2019, 9:23 AM

## 2019-11-27 NOTE — Progress Notes (Signed)
Inpatient Rehabilitation-Admissions Coordinator   Met with pt and her daughter Tia at the bedside as follow up from previous conversations I've had with her brother regarding CIR. Reviewed CIR program details and daily estimated cost of care as pt is currently uninsured. Tia plans to follow up with her siblings later today to confirm that CIR is the program they want to pursue and to follow up on their Medicaid of [place] application. I have no beds available today for this patient but will continue to follow up with pt and her family as beds become available to see if this is their program of choice.   I have contacted financial counseling for assistance with this case.   Please call if questions.   Raechel Ache, OTR/L  Rehab Admissions Coordinator  [phone] 11/27/2019 11:50 AM

## 2019-11-27 NOTE — Progress Notes (Signed)
Physical Therapy Treatment Patient Details Name: Olivia Mann MRN: [id number] DOB: 1956/09/16 Today's Date: 11/27/2019    History of Present Illness Pt is a 64 y.o. female PMHx: substance abuse. Pt presenting with headache, new onset seizures, agitation, and AMS. 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 ETT 1/3-1/4.     PT Comments    Pt more alert and able to engage with PT in conversation and participate in PT session. Pt presenting with severe L side neglect, impaired proprioception, inability to maintain midline position at EOB, and requires mod/maxA for all mobility. Pt unable to amb at this time due to poor trunk control and L LE flaccidity. Pt with very supportive family. Continue to recommend CIR upon d/c to maximize functional recovery, as pt was indep and working PTA.    Follow Up Recommendations  CIR     Equipment Recommendations  (TBD at next venuw)    Recommendations for Other Services Rehab consult     Precautions / Restrictions Precautions Precautions: Fall Precaution Comments: significant L neglect, extremely poor vision suspect L field cut, R gaze Restrictions Weight Bearing Restrictions: No    Mobility  Bed Mobility Overal bed mobility: Needs Assistance Bed Mobility: Supine to Sit;Sit to Supine     Supine to sit: Mod assist;+2 for physical assistance Sit to supine: Mod assist;+2 for physical assistance   General bed mobility comments: pt initiating transfer however pt unable to sequence to complete on own in addition to L UE/LE weakness. Pt with head kept in R rotation, max verbal cues for pt to turn head to L, max directional verbal /tactile cues for safety. Pt with strong L Lateral lean, even resisting to training to move patient to midline.  Transfers Overall transfer level: Needs assistance Equipment used: 2 person hand held assist Transfers: Sit  to/from [employer] Sit to Stand: Mod assist;+2 physical assistance Stand pivot transfers: Max assist;+2 physical assistance       General transfer comment: pt with strong L lateral lean despite max verbal and tactile cues. Pt able to power up but required L knee to be blocked. total A required to advance L LE during std pvt transfer  Ambulation/Gait             General Gait Details: unsafe at this time   Stairs             Wheelchair Mobility    Modified Rankin (Stroke Patients Only) Modified Rankin (Stroke Patients Only) Pre-Morbid Rankin Score: No symptoms Modified Rankin: Severe disability     Balance Overall balance assessment: Needs assistance Sitting-balance support: Single extremity supported;Feet supported Sitting balance-Leahy Scale: Poor Sitting balance - Comments: pt pushing with R UE to lean L, pt resistant to tactile cues to achieve midline, attempted to use selfie mode on camera to provide feedback to patient however pt unable to see phone due to significantly impaired vision, pt sat EOB x 3 min however unable unable to maintain upright position despite maxA, once pt assisted to [facility], pt able to sit and maintain midline better with min/modA Postural control: Posterior lean;Left lateral lean Standing balance support: Single extremity supported Standing balance-Leahy Scale: Poor Standing balance comment: blocked L LE throughout standing; could not stand without external support  Cognition Arousal/Alertness: Awake/alert Behavior During Therapy: Restless Overall Cognitive Status: Impaired/Different from baseline Area of Impairment: Attention;Following commands;Safety/judgement;Awareness;Problem solving                   Current Attention Level: Sustained   Following Commands: Follows one step commands inconsistently;Follows one step commands with increased time Safety/Judgement: Decreased  awareness of safety;Decreased awareness of deficits(L sided neglect (severe)) Awareness: Emergent(stated "I need to go to the bathroom") Problem Solving: Slow processing;Difficulty sequencing;Requires verbal cues;Requires tactile cues General Comments: pt able to follow commands fairly consistent on R UE and LE but not L UE/LE. Pt unable to sequence sit to stand to std pvt transfer to use [facility], pt able to recall [facility] once oriented. pt restless in bed      Exercises      General Comments General comments (skin integrity, edema, etc.): VSS, pt assisted to [facility], pt attempted to perform hygiene in sitting      Pertinent Vitals/Pain Pain Assessment: 0-10 Pain Score: 0-No pain(pt denies pain)    Home Living                      Prior Function            PT Goals (current goals can now be found in the care plan section) Progress towards PT goals: Progressing toward goals    Frequency    Min 4X/week      PT Plan Current plan remains appropriate    Co-evaluation              AM-PAC PT "6 Clicks" Mobility   Outcome Measure  Help needed turning from your back to your side while in a flat bed without using bedrails?: A Lot Help needed moving from lying on your back to sitting on the side of a flat bed without using bedrails?: A Lot Help needed moving to and from a bed to a chair (including a wheelchair)?: A Lot Help needed standing up from a chair using your arms (e.g., wheelchair or bedside chair)?: A Lot Help needed to walk in hospital room?: Total Help needed climbing 3-5 steps with a railing? : Total 6 Click Score: 10    End of Session Equipment Utilized During Treatment: Gait belt Activity Tolerance: Patient tolerated treatment well;Patient limited by lethargy Patient left: with call bell/phone within reach;in bed;with bed alarm set Nurse Communication: Mobility status PT Visit Diagnosis: Unsteadiness on feet (R26.81);Muscle weakness  (generalized) (M62.81);Difficulty in walking, not elsewhere classified (R26.2);Hemiplegia and hemiparesis Hemiplegia - Right/Left: Left Hemiplegia - dominant/non-dominant: Non-dominant Hemiplegia - caused by: Nontraumatic intracerebral hemorrhage     Time: 5732-2025 PT Time Calculation (min) (ACUTE ONLY): 33 min  Charges:  $Therapeutic Activity: 8-22 mins $Neuromuscular Re-education: 8-22 mins                     Kittie Plater, PT, DPT Acute Rehabilitation Services Pager #: [phone] Office #: [phone]    Berline Lopes 11/27/2019, 2:26 PM

## 2019-11-28 LAB — GLUCOSE, CAPILLARY
Glucose-Capillary: 116 mg/dL — ABNORMAL HIGH (ref 70–99)
Glucose-Capillary: 110 mg/dL — ABNORMAL HIGH (ref 70–99)
Glucose-Capillary: 119 mg/dL — ABNORMAL HIGH (ref 70–99)
Glucose-Capillary: 107 mg/dL — ABNORMAL HIGH (ref 70–99)

## 2019-11-28 NOTE — Progress Notes (Signed)
Physical Therapy Treatment Patient Details Name: Olivia Mann MRN: [id number] DOB: 06/27/1956 Today's Date: 11/28/2019    History of Present Illness Pt is a 64 y.o. female PMHx: substance abuse. Pt presenting with headache, new onset seizures, agitation, and AMS. 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 ETT 1/3-1/4.     PT Comments    Patient continues with severe Lt inattention, however will turn her head past midline and look at her left arm or left knee with cues. Less lateral pushing towards her left, and even able to correct to midline when given a target "touch your shoulder to my shoulder and hold it there." Very internally distracted with reports of nausea. Restless and impulsive and unsafe to leave up in recliner (even with chair alarm) and returned to bed.     Follow Up Recommendations  CIR     Equipment Recommendations  (TBD at next venuw)    Recommendations for Other Services Rehab consult     Precautions / Restrictions Precautions Precautions: Fall Precaution Comments: significant L inattention, extremely poor vision suspect L field cut, R gaze Restrictions Weight Bearing Restrictions: No    Mobility  Bed Mobility Overal bed mobility: Needs Assistance Bed Mobility: Rolling;Sidelying to Sit;Sit to Supine Rolling: Supervision Sidelying to sit: Min assist;HOB elevated   Sit to supine: Mod assist;+2 for physical assistance   General bed mobility comments: rolling with rails (supervision for lines/tubes); rt side to sit, pt forgetting RLE is up on bed (despite max cues); required assist to bring RLE off bed; return to lying pt very distracted and incr assist  Transfers Overall transfer level: Needs assistance Equipment used: (stedy vs bedrail) Transfers: Sit to/from [employer] Sit to Stand: Mod assist;From elevated surface Stand pivot transfers:  Mod assist       General transfer comment: pt able to use RUE and RLE to come to stand; no activation of LLE elicited in standing; stood numerous times from seat of stedy; pt unsafe to leave up in chair; stand pivot to her rt recliner to bed wiht pt holding upper bed rail  Ambulation/Gait             General Gait Details: unsafe at this time   Stairs             Wheelchair Mobility    Modified Rankin (Stroke Patients Only) Modified Rankin (Stroke Patients Only) Pre-Morbid Rankin Score: No symptoms Modified Rankin: Severe disability     Balance Overall balance assessment: Needs assistance Sitting-balance support: Single extremity supported;Feet supported Sitting balance-Leahy Scale: Poor Sitting balance - Comments: less pushing with RUE noted; able to bring her shoulders to her rt to target and hold for up to 3 seconds, then loses attention and back to her left Postural control: Left lateral lean Standing balance support: Single extremity supported Standing balance-Leahy Scale: Poor Standing balance comment: blocked L LE throughout standing; could not stand without external support                            Cognition Arousal/Alertness: Awake/alert Behavior During Therapy: Restless;Impulsive Overall Cognitive Status: Impaired/Different from baseline Area of Impairment: Attention;Following commands;Safety/judgement;Awareness;Problem solving                   Current Attention Level: Sustained   Following Commands: Follows one step commands  inconsistently Safety/Judgement: Decreased awareness of safety;Decreased awareness of deficits(L sided inattention (severe)) Awareness: Intellectual Problem Solving: Slow processing;Difficulty sequencing;Requires verbal cues;Requires tactile cues General Comments: pt very restless, reporting nausea and neck pain; very internally distracted       Exercises      General Comments        Pertinent  Vitals/Pain Pain Assessment: Faces Faces Pain Scale: Hurts even more Pain Location: neck Pain Descriptors / Indicators: Guarding;Discomfort Pain Intervention(s): Monitored during session;Repositioned    Home Living                      Prior Function            PT Goals (current goals can now be found in the care plan section) Acute Rehab PT Goals Patient Stated Goal: agrees wants to get stronger;moving better Time For Goal Achievement: 12/06/19 Potential to Achieve Goals: Fair Progress towards PT goals: Progressing toward goals    Frequency    Min 4X/week      PT Plan Current plan remains appropriate    Co-evaluation PT/OT/SLP Co-Evaluation/Treatment: Yes Reason for Co-Treatment: Complexity of the patient's impairments (multi-system involvement);For patient/therapist safety;To address functional/ADL transfers          AM-PAC PT "6 Clicks" Mobility   Outcome Measure  Help needed turning from your back to your side while in a flat bed without using bedrails?: A Little Help needed moving from lying on your back to sitting on the side of a flat bed without using bedrails?: A Lot Help needed moving to and from a bed to a chair (including a wheelchair)?: A Lot Help needed standing up from a chair using your arms (e.g., wheelchair or bedside chair)?: A Lot Help needed to walk in hospital room?: Total Help needed climbing 3-5 steps with a railing? : Total 6 Click Score: 11    End of Session Equipment Utilized During Treatment: Gait belt Activity Tolerance: Patient tolerated treatment well Patient left: with call bell/phone within reach;in bed;with bed alarm set Nurse Communication: Mobility status;Other (comment)(reporting nausea) PT Visit Diagnosis: Unsteadiness on feet (R26.81);Muscle weakness (generalized) (M62.81);Difficulty in walking, not elsewhere classified (R26.2);Hemiplegia and hemiparesis Hemiplegia - Right/Left: Left Hemiplegia -  dominant/non-dominant: Non-dominant Hemiplegia - caused by: Nontraumatic intracerebral hemorrhage     Time: 1505-6979 PT Time Calculation (min) (ACUTE ONLY): 45 min  Charges:  $Neuromuscular Re-education: 23-37 mins                      Arby Barrette, PT Pager [phone]    Rexanne Mano 11/28/2019, 11:11 AM

## 2019-11-28 NOTE — Progress Notes (Signed)
Inpatient Rehabilitation-Admissions Coordinator   Met with pt and her daughter bedside. Family still working on [place] of [place] application, they are hopeful for an approval by tomorrow. I do not have a bed available for this patient today but will follow up tomorrow if a bed becomes available.   Please call if questions.    [place], OTR/L  Rehab Admissions Coordinator  [phone] 11/28/2019 12:23 PM  

## 2019-11-28 NOTE — Progress Notes (Signed)
Occupational Therapy Treatment Patient Details Name: Olivia Mann MRN: [id number] DOB: [birth year] Today's Date: 11/28/2019    History of present illness Pt is a 64 y.o. female PMHx: substance abuse. Pt presenting with headache, new onset seizures, agitation, and AMS. 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 ETT 1/3-1/4.    OT comments  Pt limited by L hemiplegia, decreased ability to care for self and increased caregiver assist required. Pt requiring total assist for L side hemiplegia. Pt inattentive to L side; pt able to look to R if directed by name and lasts about 3 seconds. Pt's bed mobility with modA +2 and use of steady for standing with modA overall. Pt appears enthusiastic about sit to stands with stedy using R side. Pt would benefit from continued OT skilled services and OT following acutely.   Follow Up Recommendations  CIR    Equipment Recommendations  Other (comment)(to be determined)    Recommendations for Other Services      Precautions / Restrictions Precautions Precautions: Fall Precaution Comments: significant L inattention, extremely poor vision suspect L field cut, R gaze Restrictions Weight Bearing Restrictions: No       Mobility Bed Mobility Overal bed mobility: Needs Assistance Bed Mobility: Rolling;Sidelying to Sit;Sit to Supine Rolling: Supervision Sidelying to sit: Min assist;HOB elevated   Sit to supine: Mod assist;+2 for physical assistance   General bed mobility comments: sitting up on R side; very easily distracted  Transfers Overall transfer level: Needs assistance Equipment used: (stedy vs bedrail) Transfers: Sit to/from [facility] Sit to Stand: Mod assist;From elevated surface Stand pivot transfers: Mod assist       General transfer comment: Pt able to RUE and RLE, but unable to weight bear with LUE or LLE.    Balance  Overall balance assessment: Needs assistance Sitting-balance support: Single extremity supported;Feet supported Sitting balance-Leahy Scale: Poor Sitting balance - Comments: Poor ability to stare to L <3 secs Postural control: Left lateral lean Standing balance support: Single extremity supported Standing balance-Leahy Scale: Poor Standing balance comment: blocked L LE throughout standing; could not stand without external support                           ADL either performed or assessed with clinical judgement   ADL Overall ADL's : Needs assistance/impaired Eating/Feeding: NPO   Grooming: Maximal assistance;Total assistance;Sitting;Cueing for safety;Cueing for sequencing               Lower Body Dressing: Total assistance;+2 for physical assistance;+2 for safety/equipment;Cueing for safety;Sitting/lateral leans;Bed level   Toilet Transfer: Moderate assistance;+2 for physical assistance;+2 for safety/equipment;Stand-pivot   Toileting- Clothing Manipulation and Hygiene: Moderate assistance;+2 for physical assistance;+2 for safety/equipment;Sitting/lateral lean;Sit to/from stand;Cueing for safety       Functional mobility during ADLs: Moderate assistance;+2 for physical assistance;+2 for safety/equipment;Cueing for safety General ADL Comments: Pt limited by L hemiplegia, decreased ability to care for self and decreased attention to follow commands     Vision   Vision Assessment?: Vision impaired- to be further tested in functional context;Yes Eye Alignment: Impaired (comment) Ocular Range of Motion: Restricted on the right Alignment/Gaze Preference: Gaze right Additional Comments: Pt requiring cues to continue to look to L as pt only looking down and to R   Perception     Praxis  Cognition Arousal/Alertness: Awake/alert Behavior During Therapy: Restless;Impulsive Overall Cognitive Status: Impaired/Different from baseline Area of Impairment:  Attention;Following commands;Safety/judgement;Awareness;Problem solving                   Current Attention Level: Sustained   Following Commands: Follows one step commands inconsistently Safety/Judgement: Decreased awareness of safety;Decreased awareness of deficits(L sided inattention (severe)) Awareness: Intellectual Problem Solving: Slow processing;Difficulty sequencing;Requires verbal cues;Requires tactile cues General Comments: Pt appearing restless and easily distracted by wires and attending only to R        Exercises Other Exercises Other Exercises: PROM to LUE shoulder through digits; flaccidity noted   Shoulder Instructions       General Comments      Pertinent Vitals/ Pain       Pain Assessment: Faces Faces Pain Scale: Hurts even more Pain Location: neck Pain Descriptors / Indicators: Guarding;Discomfort Pain Intervention(s): Monitored during session  Home Living                                          Prior Functioning/Environment              Frequency  Min 2X/week        Progress Toward Goals  OT Goals(current goals can now be found in the care plan section)  Progress towards OT goals: Progressing toward goals  Acute Rehab OT Goals Patient Stated Goal: agrees wants to get stronger;moving better OT Goal Formulation: Patient unable to participate in goal setting Time For Goal Achievement: 12/06/19 Potential to Achieve Goals: Good ADL Goals Pt Will Perform Eating: with set-up;sitting Pt Will Perform Grooming: with set-up;sitting Pt Will Perform Lower Body Dressing: with mod assist;sitting/lateral leans;sit to/from stand Pt Will Transfer to Toilet: with mod assist;stand pivot transfer;bedside commode Pt/caregiver will Perform Home Exercise Program: Increased strength;Left upper extremity;With written HEP provided Additional ADL Goal #1: Pt will attend to functional tasks x5 mins in 3/3 trials with minimal cues to  redirect pt.  Plan Discharge plan remains appropriate    Co-evaluation    PT/OT/SLP Co-Evaluation/Treatment: Yes Reason for Co-Treatment: Complexity of the patient's impairments (multi-system involvement);To address functional/ADL transfers   OT goals addressed during session: ADL's and self-care      AM-PAC OT "6 Clicks" Daily Activity     Outcome Measure   Help from another person eating meals?: Total Help from another person taking care of personal grooming?: A Lot Help from another person toileting, which includes using toliet, bedpan, or urinal?: Total Help from another person bathing (including washing, rinsing, drying)?: Total Help from another person to put on and taking off regular upper body clothing?: Total Help from another person to put on and taking off regular lower body clothing?: Total 6 Click Score: 7    End of Session Equipment Utilized During Treatment: Gait belt  OT Visit Diagnosis: Unsteadiness on feet (R26.81);Muscle weakness (generalized) (M62.81)   Activity Tolerance Patient tolerated treatment well   Patient Left in bed;with call bell/phone within reach;with bed alarm set   Nurse Communication Mobility status        Time: 4656-8127 OT Time Calculation (min): 50 min  Charges: OT General Charges $OT Visit: 1 Visit OT Treatments $Neuromuscular Re-education: 8-22 mins  Flora Lipps OTR/L Acute Rehabilitation Services Pager: [phone] Office: [phone]    Fergus Throne C 11/28/2019, 5:19 PM

## 2019-11-28 NOTE — Progress Notes (Signed)
Pt Daughter call nurse to room. Stated patient had been in pain and no one gave her pain meds since 2 am. This nurse explained that I had been in the room several times and patient never mentioned pain. Daughter request to see the doctor because she wants pain meds scheduled instead of PRN. This nurse stated she would relay the message to the doctor. Will follow up with Neurosurgery. Mayford Knife RN

## 2019-11-28 NOTE — PMR Pre-admission (Signed)
PMR Admission Coordinator Pre-Admission Assessment  Patient: Olivia Mann is an 64 y.o., female MRN: [id number] DOB: 01/26/1956 Height: 5\' 1"  (154.9 cm) Weight: 57 kg              Insurance Information HMO:     PPO:      PCP:      IPA:      80/20:      OTHER:  PRIMARY: [place]      Policy#: [id number]      Subscriber: Patient CM Name:       Phone#:      Fax#:  Pre-Cert#:       Employer:  Benefits:  Phone #: [phone]     Name: DSS-provider portal *Also called Cover [place] [phone] and spoke to PeruShaena who confirmed Plan First policy (limited medicaid from 1/1/-1/31, then pt to switch to Full Medicaid coverage 12/18/19-10/2020) Eff. Date: Limited Medicaid plan (Plan First) effective 11/17/19-12/17/19; Full Medicaid coverage 12/18/19-10/2020     Deduct:       Out of Pocket Max:       Life Max:  CIR: Pt is essentially self pay as her limited medicaid plan does not cover any hospital coverage or medical bills related to rehab -and pt/family aware of estimated daily cost of care ($3,500).       SNF:  Outpatient:      Co-Pay:  Home Health:       Co-Pay:  DME:      Co-Pay:  Providers:   Pt has limited medicaid plan ([place]) which is essentially family planning medicaid with no hospital or medical benefits. Pt will have limited coverage from 1/1-1/31 and will switch to full Medicaid coverage (with hospital coverage) 2/1 and be ongoing til 10/2020. **HOWEVER pt will need to have prior authorization for IP Rehab services starting 2/1. For out of state pre-auth request, call [facility] [phone] on 2/1. Pt/family aware there is no guarantee she will get approved for CIR even if she is already in rehab at that time and if denied, would remain as self pay ($3,500/day).   SECONDARY:       Policy#:       Subscriber:  CM Name:       Phone#:      Fax#:  Pre-Cert#:       Employer:  Benefits:  Phone #:      Name:  Eff. Date:       Deduct:       Out of Pocket Max:       Life Max:  CIR:       SNF:  Outpatient:      Co-Pay:  Home Health:       Co-Pay:  DME:      Co-Pay:   Medicaid Application Date:       Case Manager:  Disability Application Date:       Case Worker:   The "Data Collection Information Summary" for patients in Inpatient Rehabilitation Facilities with attached "Privacy Act Statement-Health Care Records" was provided and verbally reviewed with: N/A  Emergency Contact Information Contact Information    Name Relation Home Work [employer]   [place], [place] Son   [phone]   Donavan Foilndersen, Tia Daughter [phone]  [phone]   Tessie FassRobinson, Trisha Daughter   [phone]   Terrace ArabiaGoode, Melvin Spouse   [phone]     Current Medical History  Patient Admitting Diagnosis: Subarachnoid hemorrhage   History of Present Illness: [name] is a 64 year old female with unremarkable past medical history on no prescription medications.  Presented 11/18/2019 with witnessed seizure after complaints of headache.  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  No hydrocephalus.  Mass-effect with right to left shift of 3 mm.  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  The aneurysm was 2 to 3 mm proximal to the ICA bifurcation.  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  Patient underwent right frontotemporal craniotomy for clipping of internal carotid artery aneurysm as well as evacuation of right temporal hematoma on 11/19/2019 per Dr. Conchita Paris.  Maintained on Keppra for seizure prophylaxis.  Subcutaneous Lovenox added for DVT prophylaxis 11/22/2019. Dyshagia #1 thin liquids as well as nasogastric tube feeds for nutritional support..  Therapy evaluations completed and patient is to be admitted  for a comprehensive rehab program on 12/01/19.  Complete NIHSS TOTAL: 18 Glasgow Coma Scale Score: 14  Past Medical History  History reviewed. No pertinent past medical history.  Family History  family history is not on file.  Prior Rehab/Hospitalizations:  Has the patient had prior rehab or hospitalizations prior to admission? No  Has the patient had major surgery during 100 days prior to admission? Yes  Current Medications   Current Facility-Administered Medications:  .  0.9 %  sodium chloride infusion, , Intravenous, Continuous, Dorthea Cove, NP, Last Rate: 75 mL/hr at 12/01/19 0630, New Bag at 12/01/19 0630 .  0.9 %  sodium chloride infusion, , Intravenous, PRN, Meyran, Tiana Loft, NP, Stopped at 11/20/19 1324 .  acetaminophen (TYLENOL) tablet 650 mg, 650 mg, Oral, Q4H PRN **OR** acetaminophen (TYLENOL) 160 MG/5ML solution 650 mg, 650 mg, Per Tube, Q4H PRN, 650 mg at 11/30/19 0859 **OR** acetaminophen (TYLENOL) suppository 650 mg, 650 mg, Rectal, Q4H PRN, Tia Alert, MD .  ALPRAZolam Prudy Feeler) tablet 1 mg, 1 mg, Per Tube, Q6H PRN, Lisbeth Renshaw, MD, 1 mg at 11/29/19 2306 .  chlorhexidine (PERIDEX) 0.12 % solution 15 mL, 15 mL, Mouth Rinse, BID, Lisbeth Renshaw, MD, 15 mL at 12/01/19 0902 .  Chlorhexidine Gluconate Cloth 2 % PADS 6 each, 6 each, Topical, Daily, Lisbeth Renshaw, MD, 6 each at 12/01/19 0903 .  docusate (COLACE) 50 MG/5ML liquid 100 mg, 100 mg, Per Tube, BID, Lisbeth Renshaw, MD, 100 mg at 11/29/19 0918 .  enoxaparin (LOVENOX) injection 40 mg, 40 mg, Subcutaneous, Q24H, Biby, Sharon L, NP, 40 mg at 12/01/19 0902 .  feeding supplement (JEVITY 1.2 CAL) liquid 1,000 mL, 1,000 mL, Per Tube, Continuous, Lisbeth Renshaw, MD, Last Rate: 60 mL/hr at 12/01/19 0630, 1,000 mL at 12/01/19 0630 .  feeding supplement (PRO-STAT SUGAR FREE 64) liquid 30 mL, 30 mL, Per Tube, Daily, Lisbeth Renshaw, MD, 30 mL at 12/01/19 0902 .  guaiFENesin (ROBITUSSIN) 100  MG/5ML solution 100 mg, 5 mL, Per Tube, Q4H PRN, Costella, Vincent J, PA-C, 100 mg at 12/01/19 1446 .  hydrALAZINE (APRESOLINE) injection 5-10 mg, 5-10 mg, Intravenous, Q4H PRN, Costella, Vincent J, PA-C, 10 mg at 11/26/19 2215 .  labetalol (NORMODYNE) injection 5-20 mg, 5-20 mg, Intravenous, Q10 min PRN, Costella, Vincent J, PA-C, 20 mg at 11/23/19 0603 .  levETIRAcetam (KEPPRA) 100 MG/ML solution 1,000 mg, 1,000 mg, Per Tube, BID, Nundkumar,  Marlane Hatcher, MD, 1,000 mg at 12/01/19 0902 .  MEDLINE mouth rinse, 15 mL, Mouth Rinse, q12n4p, Lisbeth Renshaw, MD, 15 mL at 12/01/19 1522 .  niMODipine (NIMOTOP) capsule 60 mg, 60 mg, Oral, Q4H **OR** niMODipine (NYMALIZE) 6 MG/ML oral solution 60 mg, 60 mg, Per Tube, Q4H, Tia Alert, MD, 60 mg at 12/01/19 1447 .  ondansetron (ZOFRAN-ODT) disintegrating tablet 4 mg, 4 mg, Oral, Q6H PRN **OR** ondansetron (ZOFRAN) injection 4 mg, 4 mg, Intravenous, Q6H PRN, Tia Alert, MD, 4 mg at 11/28/19 1001 .  oxyCODONE (ROXICODONE) 5 MG/5ML solution 5-10 mg, 5-10 mg, Per Tube, Q3H PRN, Costella, Vincent J, PA-C, 10 mg at 12/01/19 1446 .  pantoprazole (PROTONIX) EC tablet 40 mg, 40 mg, Oral, Daily **OR** pantoprazole sodium (PROTONIX) 40 mg/20 mL oral suspension 40 mg, 40 mg, Per Tube, Daily, Tia Alert, MD, 40 mg at 12/01/19 0902 .  phenol (CHLORASEPTIC) mouth spray 1 spray, 1 spray, Mouth/Throat, PRN, Lisbeth Renshaw, MD, 1 spray at 11/26/19 1050  Patients Current Diet:  Diet Order            DIET - DYS 1 Room service appropriate? Yes; Fluid consistency: Thin  Diet effective now              Precautions / Restrictions Precautions Precautions: Fall, Other (comment) Precaution Comments: L neglect, L hemiparesis Restrictions Weight Bearing Restrictions: No   Has the patient had 2 or more falls or a fall with injury in the past year?No  Prior Activity Level Community (5-7x/wk): worked as Engineer, manufacturing systems. did drive, was Independent PTA. No AD  use  Prior Functional Level Prior Function Level of Independence: Independent  Self Care: Did the patient need help bathing, dressing, using the toilet or eating?  Independent  Indoor Mobility: Did the patient need assistance with walking from room to room (with or without device)? Independent  Stairs: Did the patient need assistance with internal or external stairs (with or without device)? Independent  Functional Cognition: Did the patient need help planning regular tasks such as shopping or remembering to take medications? Independent  Home Assistive Devices / Equipment Home Equipment: None  Prior Device Use: Indicate devices/aids used by the patient prior to current illness, exacerbation or injury? None of the above  Current Functional Level Cognition  Arousal/Alertness: Lethargic Overall Cognitive Status: Impaired/Different from baseline Difficult to assess due to: Level of arousal Current Attention Level: Focused Orientation Level: Oriented to person, Oriented to place, Oriented to situation, Disoriented to time Following Commands: Follows one step commands inconsistently, Follows one step commands with increased time Safety/Judgement: Decreased awareness of safety, Decreased awareness of deficits General Comments: requires increased time and multimodal cues for command following, safety with mobility.  Attention: Sustained, Focused Focused Attention: Impaired Focused Attention Impairment: Functional complex Sustained Attention: Appears intact Memory: (Unable to evalute ) Awareness: (Unable to evalute ) Problem Solving: (Unable to evalute ) Executive Function: (Unable to evalute ) Safety/Judgment: Appears intact    Extremity Assessment (includes Sensation/Coordination)  Upper Extremity Assessment: Generalized weakness, LUE deficits/detail LUE Deficits / Details: L hemiplegia LUE Coordination: decreased fine motor, decreased gross motor  Lower Extremity Assessment:  Defer to PT evaluation, Generalized weakness, LLE deficits/detail LLE Deficits / Details: no active movement, flaccidity    ADLs  Overall ADL's : Needs assistance/impaired Eating/Feeding: NPO Grooming: Maximal assistance, Total assistance, Sitting, Cueing for safety, Cueing for sequencing Upper Body Bathing: Maximal assistance, Total assistance, Sitting Lower Body Bathing: Total assistance, +2 for physical assistance, +2 for  safety/equipment, Sitting/lateral leans, Bed level Upper Body Dressing : Maximal assistance, Total assistance, Cueing for safety, Cueing for sequencing, Sitting, Bed level Lower Body Dressing: Total assistance, +2 for physical assistance, +2 for safety/equipment, Cueing for safety, Sitting/lateral leans, Bed level Toilet Transfer: Moderate assistance, +2 for physical assistance, +2 for safety/equipment, Stand-pivot Toilet Transfer Details (indicate cue type and reason): deferred Toileting- Clothing Manipulation and Hygiene: Moderate assistance, +2 for physical assistance, +2 for safety/equipment, Sitting/lateral lean, Sit to/from stand, Cueing for safety Functional mobility during ADLs: Moderate assistance, +2 for physical assistance, +2 for safety/equipment, Cueing for safety General ADL Comments: Pt limited by L hemiplegia, decreased ability to care for self and decreased attention to follow commands    Mobility  Overal bed mobility: Needs Assistance Bed Mobility: Sidelying to Sit Rolling: Supervision Sidelying to sit: Mod assist, +2 for safety/equipment Supine to sit: Mod assist Sit to supine: Mod assist, +2 for physical assistance General bed mobility comments: Pt requiring assist for LUE/LLE management, max cues for using rail to pull up with RUE, pt with RLE externally rotated underneath her, needing manual assist to return to neutral positioning once sitting EOB.    Transfers  Overall transfer level: Needs assistance Equipment used: 2 person hand held  assist Transfer via Lift Equipment: Stedy Transfers: Sit to/from Stand Sit to Stand: From elevated surface, Mod assist, +2 safety/equipment Stand pivot transfers: Mod assist, +2 physical assistance, +2 safety/equipment, From elevated surface General transfer comment: ModA to power up with LLE blocked. Tactile/verbal cueing for hip extension, left knee extension, cervical extension. Pt with decreased ability to weight shift. ModA + 2 for pivoting towards right side     Ambulation / Gait / Stairs / Wheelchair Mobility  Ambulation/Gait General Gait Details: unable    Posture / Balance Dynamic Sitting Balance Sitting balance - Comments: able to sit EOB with RUE support; propping on R elbow x3 with good righting to midline with verbal cuing. AP, lateral leaning righting reactions present but with increased time to perform and occasional min assist to complete. Balance Overall balance assessment: Needs assistance Sitting-balance support: Single extremity supported, Feet supported Sitting balance-Leahy Scale: Fair Sitting balance - Comments: able to sit EOB with RUE support; propping on R elbow x3 with good righting to midline with verbal cuing. AP, lateral leaning righting reactions present but with increased time to perform and occasional min assist to complete. Postural control: Left lateral lean Standing balance support: No upper extremity supported, During functional activity Standing balance-Leahy Scale: Zero Standing balance comment: maxA to stand upright with LLE blocked    Special needs/care consideration BiPAP/CPAP: no CPM: no Continuous Drip IV: 0.9% sodium chloride infusion ; feeding supplement  Dialysis: no        Days: no Life Vest: no Oxygen: no, on RA Special Bed: no Trach Size: no Wound Vac (area): no      Location: no Skin: skin tear to left arm, surgical incision to head  Bowel mgmt:last BM 12/01/19, continent Bladder mgmt: continent Diabetic mgmt: no Behavioral  consideration : restless, impulsive at times Chemo/radiation : no     Previous Home Environment (from acute therapy documentation) Type of Home: House Home Layout: One level Home Access: Stairs to enter Technical brewer of Steps: 1 Bathroom Shower/Tub: Multimedia programmer: Standard Additional Comments: Pt drowsy, but able to answer questions  Discharge Living Setting Plans for Discharge Living Setting: Patient's home, Lives with (comment)(lives with Larena Glassman her daugther) Type of Home at Discharge: House Discharge Home Layout:  Two level, Able to live on main level with bedroom/bathroom Alternate Level Stairs-Rails: None(as pt can live on first floor) Alternate Level Stairs-Number of Steps: NA(as pt can live on first floor) Discharge Home Access: Stairs to enter Entrance Stairs-Rails: None Entrance Stairs-Number of Steps: 20 total (in segments of 4 steps at a time with landings. long walkway that includes steps_landing_steps_Landing, etc Discharge Bathroom Shower/Tub: Walk-in shower Discharge Bathroom Toilet: Standard Discharge Bathroom Accessibility: Yes How Accessible: Accessible via wheelchair, Accessible via walker Does the patient have any problems obtaining your medications?: No  Social/Family/Support Systems Patient Roles: Other (Comment)(parttime worker; was in middle of retiring. ) Solicitor Information: Tia (daugther in [place]): [phone]; Bethann Berkshire (daughter in [place] who she lives with): [phone]; Glean Hess (son in [place]): [phone]; There is a husband whom she is separated from (not legally) but he is not involved much Anticipated Caregiver: Bethann Berkshire and Marquise Anticipated Caregiver's Contact Information: see above Ability/Limitations of Caregiver: Min A/Mod A Caregiver Availability: 24/7 Discharge Plan Discussed with Primary Caregiver: Yes(pt and all three children) Is Caregiver In Agreement with Plan?: Yes Does Caregiver/Family have Issues with  Lodging/Transportation while Pt is in Rehab?: No   Goals/Additional Needs Patient/Family Goal for Rehab: PT: Min A; OT: Min/Mod A; SLP: Min A Expected length of stay: 20-24 days Cultural Considerations: NA Dietary Needs: DYS 1, thin liquids Equipment Needs: TBD Special Service Needs: pt presents with pushing tendencies  Pt/Family Agrees to Admission and willing to participate: Yes Program Orientation Provided & Reviewed with Pt/Caregiver Including Roles  & Responsibilities: Yes(pt and all three children)  Barriers to Discharge: Home environment access/layout, Incontinence, Insurance for SNF coverage, Nutrition means  Barriers to Discharge Comments: uninsured; up to 20 steps to enter (family open to building ramp). use of [employer] currently.    Decrease burden of Care through IP rehab admission: NA   Possible need for SNF placement upon discharge:Not anticipated. Pt has a very devoted family with members who are able to do construction to home set up to improve access (ramp, bathroom remodel), and members who can physically assist 24/7. Anticipate pt can make measurable gains during CIR stay which would allow her to return home with 1 person assist safely.    Patient Condition: This patient's medical and functional status has changed since the consult dated: 11/22/19 in which the Rehabilitation Physician determined and documented that the patient's condition is appropriate for intensive rehabilitative care in an inpatient rehabilitation facility. See "History of Present Illness" (above) for medical update. Functional changes are: improvement in bed mobility from Max A +2 to Mod A +2, improvement in transfers from unsafe to Mod A + 2, and improvement in swallowing function from NPO to DYS 1, thin liquids. Patient's medical and functional status update has been discussed with the Rehabilitation physician and patient remains appropriate for inpatient rehabilitation. Will admit to inpatient rehab  today.  Preadmission Screen Completed By:  Cheri Rous, OT, 12/01/2019 4:00 PM ______________________________________________________________________   Discussed status with Dr. Allena Katz on 12/01/19 at 4:00PM and received approval for admission today.  Admission Coordinator:  Cheri Rous, time 4:00PM/Date1/15/21

## 2019-11-28 NOTE — H&P (Signed)
Physical Medicine and Rehabilitation Admission H&P    Chief Complaint  Patient presents with   Seizures  : HPI: Olivia Mann is a 64 year old right-handed female with unremarkable past medical history on no prescription medications.  History taken from chart review due to lethargy.  She presented on 11/18/2019 with seizures and headaches.  Cranial CT revealed subarachnoid hemorrhage.  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  No hydrocephalus.  Mass-effect with right to left shift of 3 mm.  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  The aneurysm was 2 to 3 mm proximal to the ICA bifurcation.  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  Patient underwent right frontotemporal craniotomy for clipping of internal carotid artery aneurysm as well as evacuation of right temporal hematoma on 12/16/2019 by Dr. Kathyrn Sheriff.  She was continued on Keppra for seizure prophylaxis.  Subcutaneous Lovenox added for DVT prophylaxis 11/22/2019.  Hospital course complicated by dysphagia, and she was started on Dyshagia #1 thin liquids as well as nasogastric tube feeds for nutritional support..  Therapy evaluations completed and patient was admitted for a comprehensive rehab program.  Please see preadmission assessment from earlier today as well.  Review of Systems  Unable to perform ROS: Mental acuity   History reviewed. No pertinent past medical history.,  Unable to obtain from patient due to lethargy Past Surgical History:  Procedure Laterality Date   CRANIOTOMY Right 11/19/2019   Procedure: CRANIOTOMY CLIPPING OF CAROTID ANEURYSM;  Surgeon: Consuella Lose, MD;  Location: [facility];  Service: Neurosurgery;  Laterality: Right;   History reviewed. No pertinent family history.,  Unable  to obtain from patient due to lethargy Social History:  has no history on file for tobacco, alcohol, and drug.,  Unable to obtain from patient due to lethargy Allergies: No Known Allergies No medications prior to admission.    Drug Regimen Review Drug regimen was reviewed and remains appropriate with no significant issues identified  Home: Home Living Family/patient expects to be discharged to:: Private residence Type of Home: House Home Access: Stairs to enter Technical brewer of Steps: 1 Home Layout: One level Bathroom Shower/Tub: Multimedia programmer: Standard Home Equipment: None Additional Comments: Pt drowsy, but able to answer questions   Functional History: Prior Function Level of Independence: Independent  Functional Status:  Mobility: Bed Mobility Overal bed mobility: Needs Assistance Bed Mobility: Sidelying to Sit Rolling: Supervision Sidelying to sit: Mod assist, +2 for safety/equipment Supine to sit: Mod assist Sit to supine: Mod assist, +2 for physical assistance General bed mobility comments: Pt requiring assist for LUE/LLE management, max cues for using rail to pull up with RUE, pt with RLE externally rotated underneath her, needing manual assist to return to neutral positioning once sitting EOB. Transfers Overall transfer level: Needs assistance Equipment used: 2 person hand held assist Transfer via Lift Equipment: Stedy Transfers: Sit to/from Stand Sit to Stand: From elevated surface, Mod assist, +2 safety/equipment Stand pivot transfers: Mod assist, +2 physical assistance, +2 safety/equipment, From elevated surface General transfer comment: ModA to power up with LLE blocked. Tactile/verbal cueing for hip extension, left knee extension, cervical extension. Pt with decreased ability to weight shift. ModA + 2 for pivoting towards right side  Ambulation/Gait General  Gait Details: unable    ADL: ADL Overall ADL's : Needs  assistance/impaired Eating/Feeding: NPO Grooming: Maximal assistance, Total assistance, Sitting, Cueing for safety, Cueing for sequencing Upper Body Bathing: Maximal assistance, Total assistance, Sitting Lower Body Bathing: Total assistance, +2 for physical assistance, +2 for safety/equipment, Sitting/lateral leans, Bed level Upper Body Dressing : Maximal assistance, Total assistance, Cueing for safety, Cueing for sequencing, Sitting, Bed level Lower Body Dressing: Total assistance, +2 for physical assistance, +2 for safety/equipment, Cueing for safety, Sitting/lateral leans, Bed level Toilet Transfer: Moderate assistance, +2 for physical assistance, +2 for safety/equipment, Stand-pivot Toilet Transfer Details (indicate cue type and reason): deferred Toileting- Clothing Manipulation and Hygiene: Moderate assistance, +2 for physical assistance, +2 for safety/equipment, Sitting/lateral lean, Sit to/from stand, Cueing for safety Functional mobility during ADLs: Moderate assistance, +2 for physical assistance, +2 for safety/equipment, Cueing for safety General ADL Comments: Pt limited by L hemiplegia, decreased ability to care for self and decreased attention to follow commands  Cognition: Cognition Overall Cognitive Status: Impaired/Different from baseline Arousal/Alertness: Lethargic Orientation Level: Oriented to person, Oriented to place, Oriented to situation, Disoriented to time Attention: Sustained, Focused Focused Attention: Impaired Focused Attention Impairment: Functional complex Sustained Attention: Appears intact Memory: (Unable to evalute ) Awareness: (Unable to evalute ) Problem Solving: (Unable to evalute ) Executive Function: (Unable to evalute ) Safety/Judgment: Appears intact Cognition Arousal/Alertness: Awake/alert Behavior During Therapy: Flat affect Overall Cognitive Status: Impaired/Different from baseline Area of Impairment: Attention, Following commands,  Safety/judgement, Awareness, Problem solving Current Attention Level: Focused Following Commands: Follows one step commands inconsistently, Follows one step commands with increased time Safety/Judgement: Decreased awareness of safety, Decreased awareness of deficits Awareness: Intellectual Problem Solving: Slow processing, Difficulty sequencing, Requires verbal cues, Requires tactile cues, Decreased initiation General Comments: requires increased time and multimodal cues for command following, safety with mobility.  Difficult to assess due to: Level of arousal  Physical Exam: Blood pressure 134/88, pulse 90, temperature 98 F (36.7 C), temperature source Oral, resp. rate 17, height 5\' 1"  (1.549 m), weight 57 kg, SpO2 92 %. Physical Exam  Vitals reviewed. Constitutional: She appears well-developed and well-nourished.  HENT:  craniiotomy site clean and dry with clips + NT  Eyes: Right eye exhibits no discharge. Left eye exhibits no discharge.  Keeps eyes closed  Neck: No tracheal deviation present. No thyromegaly present.  Respiratory: Effort normal. No respiratory distress.  GI: Soft. She exhibits no distension.  Musculoskeletal:     Comments: Lower extremity edema  Neurological:  Lethargic Right gaze preference Limited awareness of deficits Motor: Not following commands  Skin: Skin is warm and dry.  See above  Psychiatric:  Unable to assess due to lethargy    Results for orders placed or performed during the hospital encounter of 11/18/19 (from the past 48 hour(s))  Glucose, capillary     Status: Abnormal   Collection Time: 11/29/19  4:25 PM  Result Value Ref Range   Glucose-Capillary 117 (H) 70 - 99 mg/dL  Glucose, capillary     Status: Abnormal   Collection Time: 11/29/19  8:11 PM  Result Value Ref Range   Glucose-Capillary 106 (H) 70 - 99 mg/dL  Glucose, capillary     Status: Abnormal   Collection Time: 11/29/19 11:40 PM  Result Value Ref Range   Glucose-Capillary  123 (H) 70 - 99 mg/dL  Glucose, capillary     Status: None   Collection Time: 11/30/19  5:11 AM  Result Value Ref Range   Glucose-Capillary 97 70 -  99 mg/dL  Glucose, capillary     Status: Abnormal   Collection Time: 11/30/19  8:07 AM  Result Value Ref Range   Glucose-Capillary 120 (H) 70 - 99 mg/dL  Glucose, capillary     Status: Abnormal   Collection Time: 11/30/19 11:54 AM  Result Value Ref Range   Glucose-Capillary 103 (H) 70 - 99 mg/dL  Glucose, capillary     Status: Abnormal   Collection Time: 11/30/19  4:06 PM  Result Value Ref Range   Glucose-Capillary 136 (H) 70 - 99 mg/dL  Glucose, capillary     Status: Abnormal   Collection Time: 11/30/19  7:56 PM  Result Value Ref Range   Glucose-Capillary 102 (H) 70 - 99 mg/dL  Glucose, capillary     Status: Abnormal   Collection Time: 12/01/19  8:00 AM  Result Value Ref Range   Glucose-Capillary 106 (H) 70 - 99 mg/dL  Glucose, capillary     Status: Abnormal   Collection Time: 12/01/19 12:20 PM  Result Value Ref Range   Glucose-Capillary 128 (H) 70 - 99 mg/dL   DG Swallowing Func-Speech Pathology  Result Date: 11/30/2019 Objective Swallowing Evaluation: Type of Study: MBS-Modified Barium Swallow Study  Patient Details Name: Olivia Mann MRN: [id number] Date of Birth: [birth year] Today's Date: 11/30/2019 Time: SLP Start Time (ACUTE ONLY): 1101 -SLP Stop Time (ACUTE ONLY): 1122 SLP Time Calculation (min) (ACUTE ONLY): 21 min Past Medical History: No past medical history on file. Past Surgical History: Past Surgical History: Procedure Laterality Date  CRANIOTOMY Right 11/19/2019  Procedure: CRANIOTOMY CLIPPING OF CAROTID ANEURYSM;  Surgeon: Lisbeth Renshaw, MD;  Location: [facility];  Service: Neurosurgery;  Laterality: Right; HPI: Pt is a 64 y.o. female with no significant PMHX presenting with headache, new onset seizures, agitation, and AMS. She was found to have a SAH (2.2 x 2.4 x 2.9 cm in R anteromedial tempral lobe) as well as an aneurysm that  may have ruptured s/p clipping 1/3. ETT 1/3-1/4. No known PMH.  Subjective: pt is even more alert than she was for me upstairs Assessment / Plan / Recommendation CH[facility] IP CLINICAL IMPRESSIONS 11/30/2019 Clinical Impression Pt has a mild-moderate dysphagia with primarily oral deficits given sensory and motor issues. She is very alert for the study, still needing Min cues to maintain upright positioning. She has reduced left-sided labial seal with anterior spillage primarily of thin liquids and soft solids. Most of a piece of graham cracker fell out of her mouth, appearing to be unmasticated. Her oral transit takes extra time even with purees and there is mild lingual residue, but with extra time she clears this well with a spontaneous second swallow. Pt's pharyngeal swallow was grossly functional, but intermittent premature spillage did result in trace amounts of penetration with thin liquids. This typically happened when she had larger amounts of liquid in her mouth, making it more challenging for her to contain it before she swallowed. Recommend starting with Dys 1 diet and thin liquids with assistance for use of precautions including small sips. Would make sure that she is alert and upright during meals as well. Nectar thick liquids were also tested and could be used as an alternative to thin liquids if performance fluctuates more at bedside. Pt's daughter Crist Infante was present in person for the study in agreement, and had her sister Bethann Berkshire join [place] through videochat as well. Will continue to follow.  SLP Visit Diagnosis Dysphagia, oropharyngeal phase (R13.12) Attention and concentration deficit following -- Frontal lobe and executive function  deficit following -- Impact on safety and function Mild aspiration risk;Moderate aspiration risk   CH[facility] IP TREATMENT RECOMMENDATION 11/30/2019 Treatment Recommendations Therapy as outlined in treatment plan below   Prognosis 11/30/2019 Prognosis for Safe Diet Advancement Good Barriers to  Reach Goals Cognitive deficits Barriers/Prognosis Comment -- [facility] IP DIET RECOMMENDATION 11/30/2019 SLP Diet Recommendations Dysphagia 1 (Puree) solids;Thin liquid Liquid Administration via Straw;Cup Medication Administration Crushed with puree Compensations Slow rate;Small sips/bites;Minimize environmental distractions;Lingual sweep for clearance of pocketing;Monitor for anterior loss;Other (Comment) Postural Changes Seated upright at 90 degrees   CHL IP OTHER RECOMMENDATIONS 11/30/2019 Recommended Consults -- Oral Care Recommendations Oral care BID Other Recommendations Have oral suction available   CH[facility] IP FOLLOW UP RECOMMENDATIONS 11/30/2019 Follow up Recommendations Inpatient Rehab   CH[facility] IP FREQUENCY AND DURATION 11/30/2019 Speech Therapy Frequency (ACUTE ONLY) min 2x/week Treatment Duration 2 weeks      CH[facility] IP ORAL PHASE 11/30/2019 Oral Phase Impaired Oral - Pudding Teaspoon -- Oral - Pudding Cup -- Oral - Honey Teaspoon -- Oral - Honey Cup -- Oral - Nectar Teaspoon -- Oral - Nectar Cup -- Oral - Nectar Straw Delayed oral transit;Decreased bolus cohesion;Premature spillage Oral - Thin Teaspoon -- Oral - Thin Cup Delayed oral transit;Decreased bolus cohesion;Premature spillage;Left anterior bolus loss Oral - Thin Straw Delayed oral transit;Decreased bolus cohesion;Premature spillage Oral - Puree Delayed oral transit;Decreased bolus cohesion;Lingual/palatal residue Oral - Mech Soft Delayed oral transit;Decreased bolus cohesion;Left anterior bolus loss;Impaired mastication Oral - Regular -- Oral - Multi-Consistency -- Oral - Pill -- Oral Phase - Comment --  CH[facility] IP PHARYNGEAL PHASE 11/30/2019 Pharyngeal Phase Impaired Pharyngeal- Pudding Teaspoon -- Pharyngeal -- Pharyngeal- Pudding Cup -- Pharyngeal -- Pharyngeal- Honey Teaspoon -- Pharyngeal -- Pharyngeal- Honey Cup -- Pharyngeal -- Pharyngeal- Nectar Teaspoon -- Pharyngeal -- Pharyngeal- Nectar Cup -- Pharyngeal -- Pharyngeal- Nectar Straw WFL Pharyngeal -- Pharyngeal-  Thin Teaspoon -- Pharyngeal -- Pharyngeal- Thin Cup Penetration/Aspiration before swallow Pharyngeal Material enters airway, remains ABOVE vocal cords and not ejected out Pharyngeal- Thin Straw Penetration/Aspiration before swallow Pharyngeal Material enters airway, remains ABOVE vocal cords and not ejected out Pharyngeal- Puree WFL Pharyngeal -- Pharyngeal- Mechanical Soft WFL Pharyngeal -- Pharyngeal- Regular -- Pharyngeal -- Pharyngeal- Multi-consistency -- Pharyngeal -- Pharyngeal- Pill -- Pharyngeal -- Pharyngeal Comment --  CHL IP CERVICAL ESOPHAGEAL PHASE 11/30/2019 Cervical Esophageal Phase WFL Pudding Teaspoon -- Pudding Cup -- Honey Teaspoon -- Honey Cup -- Nectar Teaspoon -- Nectar Cup -- Nectar Straw -- Thin Teaspoon -- Thin Cup -- Thin Straw -- Puree -- Mechanical Soft -- Regular -- Multi-consistency -- Pill -- Cervical Esophageal Comment -- Mahala MenghiniLaura N., M.A. CCC-SLP Acute Rehabilitation Services Pager [phone] Office [phone] 11/30/2019, 11:56 AM                Medical Problem List and Plan: 1.  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  Status post right frontotemporal craniotomy for clipping of internal carotid artery aneurysm as well as evacuation of right temporal hematoma 11/19/2019  -patient may not shower  -ELOS/Goals: 17-22 days/supervision/min A.  Admit to CIR 2.  Antithrombotics: -DVT/anticoagulation: Subcutaneous Lovenox initiated 11/22/2019.  Monitor for any bleeding episodes  -antiplatelet therapy: N/A 3. Pain Management: Oxycodone as needed 4. Mood: Xanax 1 mg every 6 hours as needed  -antipsychotic agents: N/A 5. Neuropsych: This patient is not capable of making decisions on her own behalf. 6. Skin/Wound Care: Routine skin checks 7. Fluids/Electrolytes/Nutrition: Routine in and outs.  CMP ordered. 8. Seizure prophylaxis.  Continue Keppra 1000 mg twice daily  9.  Post stroke dysphagia: Dysphagia #1 thin liquids.  Nasogastric tube feeds.   Follow-up speech therapy 10. Hypertension.  Completing course of Nimotop.   Monitor with increased mobility  Charlton Amor, PA-C 12/01/2019  I have personally performed a face to face diagnostic evaluation, including, but not limited to relevant history and physical exam findings, of this patient and developed relevant assessment and plan.  Additionally, I have reviewed and concur with the physician assistant's documentation above.  Maryla Morrow, MD, ABPMR

## 2019-11-28 NOTE — Progress Notes (Signed)
  NEUROSURGERY PROGRESS NOTE   No issues overnight. Cont to report HA with cough, otherwise no complaints.  EXAM:  BP (!) 145/96 (BP Location: Right Arm)   Pulse (!) 113   Temp 98 F (36.7 C) (Oral)   Resp 18   Ht 5\' 1"  (1.549 m)   Wt 60.3 kg   SpO2 100%   BMI 25.12 kg/m   Awake, alert, oriented  Speech fluent, appropriate  CN grossly intact x left facial droop Good strength RUE/RLE Left hemiplegia Wound c/d/i, no leak but remains boggy  IMPRESSION:  64 y.o. female SAH d# 10 s/p clipping of right Pcom aneurysm. Stable neurologically (with left hemiplegia). Will need CIR when bed available.   PLAN: - Cont supportive care and finish 21d Nimotop - CIR when available - Would cont to monitor boggy scalp flap. Suspect it is unlikely to represent external HCP and hopefully will resolve with time.

## 2019-11-29 LAB — GLUCOSE, CAPILLARY
Glucose-Capillary: 105 mg/dL — ABNORMAL HIGH (ref 70–99)
Glucose-Capillary: 106 mg/dL — ABNORMAL HIGH (ref 70–99)
Glucose-Capillary: 111 mg/dL — ABNORMAL HIGH (ref 70–99)
Glucose-Capillary: 113 mg/dL — ABNORMAL HIGH (ref 70–99)
Glucose-Capillary: 117 mg/dL — ABNORMAL HIGH (ref 70–99)
Glucose-Capillary: 122 mg/dL — ABNORMAL HIGH (ref 70–99)
Glucose-Capillary: 123 mg/dL — ABNORMAL HIGH (ref 70–99)
Glucose-Capillary: 125 mg/dL — ABNORMAL HIGH (ref 70–99)

## 2019-11-29 MED ORDER — JEVITY 1.2 CAL PO LIQD
1000.0000 mL | ORAL | Status: DC
  Administered 2019-11-29 – 2019-12-01 (×3): 1000 mL
  Filled 2019-11-29 (×4): qty 1000

## 2019-11-29 MED ORDER — PRO-STAT SUGAR FREE PO LIQD
30.0000 mL | Freq: Every day | ORAL | Status: DC
  Administered 2019-11-30 – 2019-12-01 (×2): 30 mL
  Filled 2019-11-29 (×2): qty 30

## 2019-11-29 NOTE — Progress Notes (Signed)
Nutrition Follow-up  DOCUMENTATION CODES:   Not applicable  INTERVENTION:  Increase Jevity 1.2 formula via Cortrak NGT to new goal rate of 60 ml/hr.   Provide 30 ml Prostat once daily.   Tube feeding regimen to provide 1828 kcal, 95 grams of protein, and 1166 ml free water.   NUTRITION DIAGNOSIS:   Increased nutrient needs related to post-op healing(acute brain injury) as evidenced by estimated needs; ongoing  GOAL:   Patient will meet greater than or equal to 90% of their needs; met with TF  MONITOR:   TF tolerance, Skin, Weight trends, Labs, I & O's  REASON FOR ASSESSMENT:   Consult(Assessment and Enteral feeding recommendations. Intiate Adult TF Protocol) Assessment of nutrition requirement/status  ASSESSMENT:   Patient is a 64 yo female presented on 1/2 following a seizure. Patient confused and agitated on admission. Code stroke called. She was intubated/sedated 1/2 for airway protection and treatment.CT findings: SAH. Patient also has 48m PComm aneurysm.  1/3 - S/p right frontotemporal craniotomy for clipping of internal carotid artery aneurysm, evacuation of right temporal hematoma   Pt unavailable during attempted of visit. Daughter at bedside reports pt has been tolerating her tube feedings well with no difficulties. RD to modify enteral feeding orders to aid in increased caloric and protein needs as well as in healing. Will continue to monitor for tolerance.   Unable to complete Nutrition-Focused physical exam at this time.   Labs and medications reviewed.   Diet Order:   Diet Order            Diet NPO time specified  Diet effective now              EDUCATION NEEDS:   Not appropriate for education at this time  Skin:  Skin Assessment: Skin Integrity Issues: Skin Integrity Issues:: Incisions Incisions: head  Last BM:  1/10  Height:   Ht Readings from Last 1 Encounters:  11/18/19 _0  (1.549 m)    Weight:   Wt Readings from Last 1  Encounters:  11/29/19 57.1 kg    Ideal Body Weight:  48 kg  BMI:  Body mass index is 23.79 kg/m.  Estimated Nutritional Needs:   Kcal:  1700-1900  Protein:  85-100 grams  Fluid:  >/= 1.7 L/day    SCorrin Parker MS, RD, LDN Pager # [phone] hours/ weekend pager # [phone]

## 2019-11-29 NOTE — Progress Notes (Signed)
  NEUROSURGERY PROGRESS NOTE   No issues overnight. Resting comfortably this am  EXAM:  BP 127/67 (BP Location: Right Arm)   Pulse 78   Temp 99 F (37.2 C) (Oral)   Resp 18   Ht 5\' 1"  (1.549 m)   Wt 57.1 kg   SpO2 96%   BMI 23.79 kg/m   Awake, alert Speech slow but appropriate Left hemiplegia Moving right side well pseudomeningocele stable, no drainage  IMPRESSION/PLAN 64 y.o. female SAH d#11 s/p clipping of right Pcom aneurysm. Stable neurologically with left hemiplegia.  - CIR when bed available.

## 2019-11-29 NOTE — Progress Notes (Signed)
Inpatient Rehabilitation-Admissions Coordinator   Pt's family continues to work on getting her approved for Medicaid of [place]. They state she should have it approved by the end of the week. As far as I am aware, Medicaid of [place] requires pre-authorization prior to admit to IP Rehab. I have placed calls to [place] of [place] per family request to clarify this issue; I await a call back. If this is the case, pt will need to have her Medicaid approved and her IP Rehab stay pre-authorization prior to admit.   AC continues to work towards eventual CIR admission, pending insurance approval process and authorization status.   Please call if questions.   Cheri Rous, OTR/L  Rehab Admissions Coordinator  [phone] 11/29/2019 6:01 PM

## 2019-11-29 NOTE — Progress Notes (Signed)
  Speech Language Pathology Treatment: Dysphagia  Patient Details Name: Olivia Mann MRN: [id number] DOB: 09-04-1956 Today's Date: 11/29/2019 Time: 5681-2751 SLP Time Calculation (min) (ACUTE ONLY): 18 min  Assessment / Plan / Recommendation Clinical Impression  Treatment today focused on education with family (two daughters: Crist Infante in person, Bethann Berkshire over the phone), as pt had just received pain medications and family preferred that she be allowed to rest, acknowledging also that if she was lethargic from medications that she might not be at her most opportune level of safety. SLP reviewed POC, impact of lethargy/medications on safety, and current level of function based on previous SLP visits. We discussed aspiration and potential consequences. SLP reviewed the purpose of MBS to better differentially diagnose swallowing function to determine best course of treatment (including least restrictive diet). Plan was formulated moving forward to reassess swallow function and readiness for MBS in the am when she may be more alert. SLP will continue to follow.   HPI HPI: Pt is a 64 y.o. female with no significant PMHX presenting with headache, new onset seizures, agitation, and AMS. 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 ETT 1/3-1/4. No known PMH.      SLP Plan  Continue with current plan of care       Recommendations  Diet recommendations: NPO Medication Administration: Via alternative means                Oral Care Recommendations: Oral care QID Follow up Recommendations: Inpatient Rehab SLP Visit Diagnosis: Dysphagia, unspecified (R13.10) Plan: Continue with current plan of care       GO                 Mahala Menghini., M.A. CCC-SLP Acute Rehabilitation Services Pager [phone] Office [phone]  11/29/2019, 2:54 PM

## 2019-11-29 NOTE — Progress Notes (Addendum)
Physical Therapy Treatment Patient Details Name: Kyan Giannone MRN: [id number] DOB: 02/23/1956 Today's Date: 11/29/2019    History of Present Illness Pt is a 64 y.o. female PMHx: substance abuse. Pt presenting with headache, new onset seizures, agitation, and AMS. 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 ETT 1/3-1/4.     PT Comments    Pt agreeable to mobility this session, but reports fatigue. PT initiated repeated transfer training with pt for LE strengthening, standing balance, and for improving tolerance for activity. Pt tolerated well, but requires LLE blocking due to L hemi at this time. Pt with improved sitting balance this session, and demonstrated ability to move from R elbow to R hand via elbow extension x3 and ability to hold midline sitting ~1 minute. PT still with significant L inattention vs neglect (pt able to orient to L only briefly, eyes do not cross midline towards L). Pt up in chair with posey alarm belt, PT to continue to follow acutely.    Follow Up Recommendations  CIR     Equipment Recommendations  (TBD at next venuw)    Recommendations for Other Services       Precautions / Restrictions Precautions Precautions: Fall Precaution Comments: significant L inattention, extremely poor vision suspect L field cut, R gaze Restrictions Weight Bearing Restrictions: No    Mobility  Bed Mobility Overal bed mobility: Needs Assistance Bed Mobility: Supine to Sit     Supine to sit: Mod assist     General bed mobility comments: mod assist for chest elevation off, LUE/LLE management, scooting to EOB with use of bed pads. Pt with propping on R elbow upon sitting up, able to right self to upright sitting when requested by PT.  Transfers Overall transfer level: Needs assistance Equipment used: 2 person hand held assist Transfers: Sit to/from [employer] Sit to Stand: From elevated surface;Min assist;Mod assist;+2 safety/equipment Stand pivot transfers: Mod assist;+2 physical assistance;+2 safety/equipment;From elevated surface       General transfer comment: Mod assist for power up, steadying, LLE blocking. Verbal cuing for hand placement, powering up through RLE. Repeated sit to stands for LE strengthening and sitting>standing balance. Stand pivot toward R with mod +2 for steadying, guiding pt's trunk/LEs to chair, slow eccentric lowering, and LLE blocking.  Ambulation/Gait             General Gait Details: NT   Stairs             Wheelchair Mobility    Modified Rankin (Stroke Patients Only)       Balance Overall balance assessment: Needs assistance Sitting-balance support: Single extremity supported;Feet supported Sitting balance-Leahy Scale: Fair Sitting balance - Comments: able to sit EOB with RUE support; propping on R elbow x3 with good righting to midline with verbal cuing. AP, lateral leaning righting reactions present but with increased time to perform and occasional min assist to complete.   Standing balance support: Single extremity supported Standing balance-Leahy Scale: Poor Standing balance comment: blocked L LE throughout standing; could not stand without external support                            Cognition Arousal/Alertness: Awake/alert Behavior During Therapy: Restless Overall Cognitive Status: Impaired/Different from baseline Area of Impairment: Attention;Following commands;Safety/judgement;Awareness;Problem solving  Current Attention Level: Focused   Following Commands: Follows one step commands inconsistently;Follows one step commands with increased time Safety/Judgement: Decreased awareness of safety;Decreased awareness of deficits Awareness: Intellectual Problem Solving: Slow processing;Difficulty sequencing;Requires verbal cues;Requires  tactile cues;Decreased initiation General Comments: Pt restless with RLE shaking with mobility and PT verbal instructions. Increased time to process and initiate mobility, multimodal cuing utilized for pt safety. Pt holding and pulling on cortrak and EKG lines, PT suspects due to weight of them during mobility. Severe L inattention noted.      Exercises Other Exercises Other Exercises: sit to stand x5, for LE strengthening and standing balance x15-30 seconds after each stand    General Comments General comments (skin integrity, edema, etc.): vsss      Pertinent Vitals/Pain Pain Assessment: No/denies pain Faces Pain Scale: No hurt Pain Intervention(s): Limited activity within patient's tolerance;Monitored during session    Home Living                      Prior Function            PT Goals (current goals can now be found in the care plan section) Acute Rehab PT Goals Patient Stated Goal: agrees wants to get stronger;moving better PT Goal Formulation: With patient Time For Goal Achievement: 12/06/19 Potential to Achieve Goals: Fair Progress towards PT goals: Progressing toward goals    Frequency    Min 4X/week      PT Plan Current plan remains appropriate    Co-evaluation              AM-PAC PT "6 Clicks" Mobility   Outcome Measure  Help needed turning from your back to your side while in a flat bed without using bedrails?: A Little Help needed moving from lying on your back to sitting on the side of a flat bed without using bedrails?: A Lot Help needed moving to and from a bed to a chair (including a wheelchair)?: A Lot Help needed standing up from a chair using your arms (e.g., wheelchair or bedside chair)?: A Lot Help needed to walk in hospital room?: Total Help needed climbing 3-5 steps with a railing? : Total 6 Click Score: 11    End of Session Equipment Utilized During Treatment: Gait belt Activity Tolerance: Patient tolerated treatment  well;Patient limited by fatigue Patient left: with call bell/phone within reach;in chair;with chair alarm set;with nursing/sitter in room(posey chair alarm belt) Nurse Communication: Mobility status PT Visit Diagnosis: Unsteadiness on feet (R26.81);Muscle weakness (generalized) (M62.81);Difficulty in walking, not elsewhere classified (R26.2);Hemiplegia and hemiparesis Hemiplegia - Right/Left: Left Hemiplegia - dominant/non-dominant: Non-dominant Hemiplegia - caused by: Nontraumatic intracerebral hemorrhage     Time: 0954-1020 PT Time Calculation (min) (ACUTE ONLY): 26 min  Charges:  $Therapeutic Activity: 8-22 mins $Neuromuscular Re-education: 8-22 mins                     Marjan Rosman E, PT Acute Rehabilitation Services Pager [phone]  Office [phone]    Margeart Allender D Elonda Husky 11/29/2019, 12:18 PM

## 2019-11-29 NOTE — Progress Notes (Addendum)
Assessed patient at 330 and said no pain unless she coughed.  Did not give her pain medicine at this time

## 2019-11-30 ENCOUNTER — Other Ambulatory Visit (HOSPITAL_COMMUNITY): Payer: Self-pay

## 2019-11-30 ENCOUNTER — Inpatient Hospital Stay (HOSPITAL_COMMUNITY): Payer: Self-pay

## 2019-11-30 LAB — GLUCOSE, CAPILLARY
Glucose-Capillary: 97 mg/dL (ref 70–99)
Glucose-Capillary: 120 mg/dL — ABNORMAL HIGH (ref 70–99)
Glucose-Capillary: 103 mg/dL — ABNORMAL HIGH (ref 70–99)
Glucose-Capillary: 136 mg/dL — ABNORMAL HIGH (ref 70–99)
Glucose-Capillary: 102 mg/dL — ABNORMAL HIGH (ref 70–99)

## 2019-11-30 NOTE — Progress Notes (Signed)
Physical Therapy Treatment Patient Details Name: Olivia Mann MRN: [id number] DOB: 1956/06/13 Today's Date: 11/30/2019    History of Present Illness Pt is a 64 y.o. female PMHx: substance abuse. Pt presenting with headache, new onset seizures, agitation, and AMS. 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 ETT 1/3-1/4.     PT Comments    Pt lethargic but able to be aroused with stimulation. Session focused on continued transfer training and promoting left sided attention. Pt requiring two person moderate assist for stand pivot transfers towards right. Unable to weight shift or significantly accept enough weight onto LLE to initiate gait training. Continues with significant left neglect; will benefit from CIR to address deficits.    Follow Up Recommendations  CIR     Equipment Recommendations  Other (comment)(TBA)    Recommendations for Other Services       Precautions / Restrictions Precautions Precautions: Fall;Other (comment) Precaution Comments: L neglect, L hemiparesis Restrictions Weight Bearing Restrictions: No    Mobility  Bed Mobility Overal bed mobility: Needs Assistance Bed Mobility: Sidelying to Sit   Sidelying to sit: Mod assist;+2 for safety/equipment       General bed mobility comments: Pt requiring assist for LUE/LLE management, max cues for using rail to pull up with RUE, pt with RLE externally rotated underneath her, needing manual assist to return to neutral positioning once sitting EOB.  Transfers Overall transfer level: Needs assistance Equipment used: 2 person hand held assist Transfers: Sit to/from Stand Sit to Stand: From elevated surface;Mod assist;+2 safety/equipment Stand pivot transfers: Mod assist;+2 physical assistance;+2 safety/equipment;From elevated surface       General transfer comment: ModA to power up with LLE blocked. Tactile/verbal  cueing for hip extension, left knee extension, cervical extension. Pt with decreased ability to weight shift. ModA + 2 for pivoting towards right side   Ambulation/Gait             General Gait Details: unable   Stairs             Wheelchair Mobility    Modified Rankin (Stroke Patients Only) Modified Rankin (Stroke Patients Only) Pre-Morbid Rankin Score: No symptoms Modified Rankin: Severe disability     Balance Overall balance assessment: Needs assistance Sitting-balance support: Single extremity supported;Feet supported Sitting balance-Leahy Scale: Fair     Standing balance support: No upper extremity supported;During functional activity Standing balance-Leahy Scale: Zero Standing balance comment: maxA to stand upright with LLE blocked                            Cognition Arousal/Alertness: Awake/alert Behavior During Therapy: Flat affect Overall Cognitive Status: Impaired/Different from baseline Area of Impairment: Attention;Following commands;Safety/judgement;Awareness;Problem solving                   Current Attention Level: Focused   Following Commands: Follows one step commands inconsistently;Follows one step commands with increased time Safety/Judgement: Decreased awareness of safety;Decreased awareness of deficits Awareness: Intellectual Problem Solving: Slow processing;Difficulty sequencing;Requires verbal cues;Requires tactile cues;Decreased initiation General Comments: requires increased time and multimodal cues for command following, safety with mobility.       Exercises      General Comments        Pertinent Vitals/Pain Pain Assessment: Faces Faces Pain Scale: No hurt    Home Living  Prior Function            PT Goals (current goals can now be found in the care plan section) Acute Rehab PT Goals Potential to Achieve Goals: Fair    Frequency    Min 4X/week      PT Plan  Current plan remains appropriate    Co-evaluation              AM-PAC PT "6 Clicks" Mobility   Outcome Measure  Help needed turning from your back to your side while in a flat bed without using bedrails?: A Little Help needed moving from lying on your back to sitting on the side of a flat bed without using bedrails?: A Lot Help needed moving to and from a bed to a chair (including a wheelchair)?: A Lot Help needed standing up from a chair using your arms (e.g., wheelchair or bedside chair)?: A Lot Help needed to walk in hospital room?: Total Help needed climbing 3-5 steps with a railing? : Total 6 Click Score: 11    End of Session Equipment Utilized During Treatment: Gait belt Activity Tolerance: Patient tolerated treatment well;Patient limited by fatigue Patient left: in chair;with call bell/phone within reach;with chair alarm set;Other (comment)(posey chair alarm belt) Nurse Communication: Mobility status PT Visit Diagnosis: Unsteadiness on feet (R26.81);Muscle weakness (generalized) (M62.81);Difficulty in walking, not elsewhere classified (R26.2);Hemiplegia and hemiparesis Hemiplegia - Right/Left: Left Hemiplegia - dominant/non-dominant: Non-dominant Hemiplegia - caused by: Nontraumatic intracerebral hemorrhage     Time: 1400-1426 PT Time Calculation (min) (ACUTE ONLY): 26 min  Charges:  $Therapeutic Activity: 23-37 mins                     Laurina Bustle, PT, DPT Acute Rehabilitation Services Pager [phone] Office [phone]    Vanetta Mulders 11/30/2019, 4:22 PM

## 2019-11-30 NOTE — Progress Notes (Signed)
Pt. Is agitated and irritable, pulling at cortrak and IV as well as trying to get up out of the bed. Pt. Is hallucinating, stating that there is a man in the room with her and saying that we are currently at her house in [place]. Pt. Is not following commands. Called provider on call for soft restraint order.

## 2019-11-30 NOTE — Progress Notes (Signed)
Modified Barium Swallow Progress Note  Patient Details  Name: Olivia Mann MRN: [id number] Date of Birth: [birth year]  Today's Date: 11/30/2019  Modified Barium Swallow completed.  Full report located under Chart Review in the Imaging Section.  Brief recommendations include the following:  Clinical Impression  Pt has a mild-moderate dysphagia with primarily oral deficits given sensory and motor issues. She is very alert for the study, still needing Min cues to maintain upright positioning. She has reduced left-sided labial seal with anterior spillage primarily of thin liquids and soft solids. Most of a piece of graham cracker fell out of her mouth, appearing to be unmasticated. Her oral transit takes extra time even with purees and there is mild lingual residue, but with extra time she clears this well with a spontaneous second swallow. Pt's pharyngeal swallow was grossly functional, but intermittent premature spillage did result in trace amounts of penetration with thin liquids. This typically happened when she had larger amounts of liquid in her mouth, making it more challenging for her to contain it before she swallowed. Recommend starting with Dys 1 diet and thin liquids with assistance for use of precautions including small sips. Would make sure that she is alert and upright during meals as well. Nectar thick liquids were also tested and could be used as an alternative to thin liquids if performance fluctuates more at bedside. Pt's daughter Olivia Mann was present in person for the study in agreement, and had her sister Olivia Mann join [place] through videochat as well. Will continue to follow.   Swallow Evaluation Recommendations       SLP Diet Recommendations: Dysphagia 1 (Puree) solids;Thin liquid   Liquid Administration via: Straw;Cup   Medication Administration: Crushed with puree   Supervision: Staff to assist with self feeding;Full supervision/cueing for compensatory strategies   Compensations:  Slow rate;Small sips/bites;Minimize environmental distractions;Lingual sweep for clearance of pocketing;Monitor for anterior loss;Other (Comment)(one sip at a time)   Postural Changes: Seated upright at 90 degrees   Oral Care Recommendations: Oral care BID   Other Recommendations: Have oral suction available     Mahala Menghini., M.A. CCC-SLP Acute Rehabilitation Services Pager [phone] Office [phone]  11/30/2019,11:55 AM

## 2019-11-30 NOTE — Progress Notes (Signed)
  NEUROSURGERY PROGRESS NOTE   No issues overnight. Unchanged HA.  EXAM:  BP 130/86 (BP Location: Left Arm)   Pulse 90   Temp 98.1 F (36.7 C) (Oral)   Resp 19   Ht 5\' 1"  (1.549 m)   Wt 57.1 kg   SpO2 98%   BMI 23.79 kg/m   Awake, alert, oriented  Speech fluent, appropriate  Stable left facial droop/left hemiplegia Wound boggy, no drainage.  IMPRESSION:  64 y.o. female SAH d# 12 s/p clipping right Pcom. Stable exam with left hemiplegia.  PLAN: - CIR when bed available - Cont current mgmt

## 2019-11-30 NOTE — Progress Notes (Signed)
Last night patient had to be restrained.  Daughter wants an explanation about what happened.  The nurse told me she was hallucinating and physically forceful because she wanted to get out of the bed.

## 2019-11-30 NOTE — Progress Notes (Signed)
  Speech Language Pathology Treatment: Dysphagia  Patient Details Name: Olivia Mann MRN: [id number] DOB: 1956/09/11 Today's Date: 11/30/2019 Time: 8295-6213 SLP Time Calculation (min) (ACUTE ONLY): 12 min  Assessment / Plan / Recommendation Clinical Impression  Pt is more alert this morning, although her mentation remains a potential risk factor for aspiration. Her voice is strong and clear though and she does not appear to have pooling secretions in her mouth, although her positioning makes it a little more challenging to see. She keeps her head tucked down and toward her right and does not open her mouth very wide to command (but does open it when asked to do so). Oral preparation seems slow, hyolaryngeal movement reduced to palpation (palpation also limited by head positioning). Occasional coughing follows boluses but today she can even take sips from the straw. Recommend proceeding with MBS to get a better evaluation of her oropharyngeal swallow. Will plan for later this morning with radiology - can hopefully avoid anything that might make her drowsy until that time.    HPI HPI: Pt is a 63 y.o. female with no significant PMHX presenting with headache, new onset seizures, agitation, and AMS. 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 ETT 1/3-1/4. No known PMH.      SLP Plan  Continue with current plan of care       Recommendations  Diet recommendations: NPO Medication Administration: Via alternative means                Oral Care Recommendations: Oral care QID Follow up Recommendations: Inpatient Rehab SLP Visit Diagnosis: Dysphagia, unspecified (R13.10) Plan: Continue with current plan of care       GO                 Mahala Menghini., M.A. CCC-SLP Acute Rehabilitation Services Pager [phone] Office [phone]  11/30/2019, 8:33 AM

## 2019-12-01 DIAGNOSIS — I69354 Hemiplegia and hemiparesis following cerebral infarction affecting left non-dominant side: Secondary | ICD-10-CM

## 2019-12-01 DIAGNOSIS — Z2989 Encounter for other specified prophylactic measures: Secondary | ICD-10-CM

## 2019-12-01 DIAGNOSIS — Z298 Encounter for other specified prophylactic measures: Secondary | ICD-10-CM

## 2019-12-01 DIAGNOSIS — I69391 Dysphagia following cerebral infarction: Secondary | ICD-10-CM

## 2019-12-01 LAB — GLUCOSE, CAPILLARY
Glucose-Capillary: 106 mg/dL — ABNORMAL HIGH (ref 70–99)
Glucose-Capillary: 128 mg/dL — ABNORMAL HIGH (ref 70–99)
Glucose-Capillary: 113 mg/dL — ABNORMAL HIGH (ref 70–99)
Glucose-Capillary: 101 mg/dL — ABNORMAL HIGH (ref 70–99)

## 2019-12-01 MED ORDER — LEVETIRACETAM 500 MG PO TABS: 500.0000 mg | ORAL_TABLET | Freq: Two times a day (BID) | ORAL | Status: DC

## 2019-12-01 MED ORDER — NIMODIPINE 30 MG PO CAPS: 60.0000 mg | ORAL_CAPSULE | ORAL | 0 refills | Status: DC

## 2019-12-01 NOTE — Progress Notes (Signed)
Jamse Arn, MD  Physician  Physical Medicine and Rehabilitation  Consult Note  Signed  Date of Service:  11/22/2019  3:02 PM      Related encounter: ED to Hosp-Admission (Current) from 11/18/2019 in [facility]            Physical Medicine and Rehabilitation Consult Reason for Consult: Decreased functional mobility Referring Physician: Dr. Kathyrn Sheriff     HPI: Olivia Mann is a 64 y.o. right-handed female with unremarkable past medical history.  History taken from chart review and son due to mental condition. Per chart review independent prior to admission.  Presented 11/18/2019 with witnessed seizure after complaints of headache. Cranial CT showed diffuse SAH, 2 x 2 x 2 0.4 x 2.9 cm intraparenchymal hematoma.  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  The aneurysm was 2 to 3 mm proximal to the ICA bifurcation.  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  Patient underwent right frontotemporal craniotomy for clipping of internal carotid artery aneurysm as well as evacuation of right temporal hematoma on 11/19/2019 per Dr. Kathyrn Sheriff.  Maintained on Keppra for seizure prophylaxis.  Subcutaneous Lovenox added for DVT prophylaxis 11/22/2019.  Patient currently NPO with alternative means of nutritional support.     Review of Systems  Unable to perform ROS: Acuity of condition    History reviewed. No pertinent past medical history., unable to obtain from patient.  Past Surgical History:  Procedure Laterality Date  . CRANIOTOMY Right 11/19/2019    Procedure: CRANIOTOMY CLIPPING OF CAROTID ANEURYSM;  Surgeon: Consuella Lose, MD;  Location: [facility];  Service: Neurosurgery;  Laterality: Right;    History reviewed. No pertinent family history., unable to obtain  from patient.  Social History:  has no history on file for tobacco, alcohol, and drug.,unable to obtain from patient.  Allergies: No Known Allergies No medications prior to admission.    Home: Home Living Family/patient expects to be discharged to:: Private residence Type of Home: House Home Access: Stairs to enter Technical brewer of Steps: 1 Home Layout: One level Bathroom Shower/Tub: Multimedia programmer: Standard Home Equipment: None Additional Comments: Pt drowsy, but able to answer questions  Functional History: Prior Function Level of Independence: Independent Functional Status:  Mobility: Bed Mobility Overal bed mobility: Needs Assistance Bed Mobility: Supine to Sit, Sit to Supine Supine to sit: Max assist, +2 for physical assistance Sit to supine: Max assist, +2 for physical assistance General bed mobility comments: pt with no command follow, pt restless in bed Transfers General transfer comment: pt unsafe at this time   ADL: ADL Overall ADL's : Needs assistance/impaired Eating/Feeding: NPO Grooming: Maximal assistance, Total assistance, Sitting Upper Body Bathing: Maximal assistance, Total assistance, Sitting Lower Body Bathing: Total assistance, +2 for physical assistance, +2 for safety/equipment, Sitting/lateral leans, Bed level Upper Body Dressing : Maximal assistance, Total assistance, Cueing for safety, Cueing for sequencing, Sitting, Bed level Lower Body Dressing: Total assistance, +2 for physical assistance, +2 for safety/equipment, Cueing for safety, Sitting/lateral leans, Bed level Toilet Transfer: Total assistance, +2 for physical assistance, +2 for safety/equipment Toilet Transfer Details (indicate cue type and reason): deferred Toileting- Clothing Manipulation and Hygiene: Total assistance, +2 for physical assistance, +2 for  safety/equipment, Cueing for safety Functional mobility during ADLs: Total assistance, +2 for physical assistance,  +2 for safety/equipment, Cueing for sequencing, Cueing for safety(only sitting EOB)   Cognition: Cognition Overall Cognitive Status: Difficult to assess Orientation Level: Oriented to person, Disoriented to situation, Disoriented to time, Oriented to place Cognition Arousal/Alertness: Lethargic, Suspect due to medications Behavior During Therapy: Restless, Flat affect, Impulsive Overall Cognitive Status: Difficult to assess Difficult to assess due to: Level of arousal   Blood pressure (!) 139/91, pulse (!) 108, temperature 98.1 F (36.7 C), temperature source Axillary, resp. rate (!) 27, height 5\' 1"  (1.549 m), weight 54.1 kg, SpO2 94 %. Physical Exam  Vitals reviewed. Constitutional: She appears well-developed and well-nourished.  HENT:  Head: Normocephalic and atraumatic.  +NG  Eyes: EOM are normal. Right eye exhibits no discharge. Left eye exhibits no discharge.  Neck: No tracheal deviation present. No thyromegaly present.  Respiratory:  Increased work of breathing Wet sounding  GI: Soft. She exhibits no distension.  Musculoskeletal:     Comments: No edema or tenderness in extremities  Neurological:  Patient is very lethargic, unable to arouse Seen freely moving RUE, no other movement noted  Skin: Skin is warm and dry.  Psychiatric:  Unable to assess due lethargy/mentation      Lab Results Last 24 Hours  Results for orders placed or performed during the hospital encounter of 11/18/19 (from the past 24 hour(s))  Basic metabolic panel     Status: Abnormal    Collection Time: 11/22/19  3:50 AM  Result Value Ref Range    Sodium 140 135 - 145 mmol/L    Potassium 3.5 3.5 - 5.1 mmol/L    Chloride 106 98 - 111 mmol/L    CO2 20 (L) 22 - 32 mmol/L    Glucose, Bld 87 70 - 99 mg/dL    BUN <5 (L) 8 - 23 mg/dL    Creatinine, Ser 01/20/20 0.44 - 1.00 mg/dL    Calcium 9.3 8.9 - 1.61 mg/dL    GFR calc non Af Amer >60 >60 mL/min    GFR calc Af Amer >60 >60 mL/min    Anion gap 14 5 -  15  CBC     Status: Abnormal    Collection Time: 11/22/19  3:50 AM  Result Value Ref Range    WBC 10.6 (H) 4.0 - 10.5 K/uL    RBC 4.14 3.87 - 5.11 MIL/uL    Hemoglobin 14.0 12.0 - 15.0 g/dL    HCT 01/20/20 04.5 - 40.9 %    MCV 99.8 80.0 - 100.0 fL    MCH 33.8 26.0 - 34.0 pg    MCHC 33.9 30.0 - 36.0 g/dL    RDW 81.1 91.4 - 78.2 %    Platelets 206 150 - 400 K/uL    nRBC 0.0 0.0 - 0.2 %  Phosphorus     Status: None    Collection Time: 11/22/19  3:50 AM  Result Value Ref Range    Phosphorus 2.6 2.5 - 4.6 mg/dL  Glucose, capillary     Status: Abnormal    Collection Time: 11/22/19 11:41 AM  Result Value Ref Range    Glucose-Capillary 107 (H) 70 - 99 mg/dL    Comment 1 Notify RN      Comment 2 Document in Chart         Imaging Results (Last 48 hours)  DG Chest Port 1 View   Result Date: 11/21/2019 CLINICAL DATA:  Seizures EXAM: PORTABLE CHEST 1  VIEW COMPARISON:  Radiograph 11/20/2019 FINDINGS: Some streaky opacity seen in the left infrahilar lung. No pneumothorax or effusion. No convincing features of edema. The cardiomediastinal contours are unremarkable. No acute osseous or soft tissue abnormality. IMPRESSION: Streaky opacities in the left infrahilar lung could reflect atelectasis or sequela of aspiration. Electronically Signed   By: Kreg Shropshire M.D.   On: 11/21/2019 05:51    VAS [place] TRANSCRANIAL DOPPLER   Result Date: 11/22/2019  Transcranial Doppler Indications: Subarachnoid hemorrhage. Limitations: Patient position and poor patient cooperation. Bandaging. Limitations for diagnostic windows: Unable to insonate right transtemporal window. Unable to insonate occipital window. Comparison Study: 11/22/2019- TCD Performing Technologist: Gertie Fey MHA, RDMS, RVT, RDCS  Examination Guidelines: A complete evaluation includes B-mode imaging, spectral Doppler, color Doppler, and power Doppler as needed of all accessible portions of each vessel. Bilateral testing is considered an integral part  of a complete examination. Limited examinations for reoccurring indications may be performed as noted.  +----------+-------------+----------+-----------+------------------+ RIGHT TCD Right VM (cm)Depth (cm)Pulsatility     Comment       +----------+-------------+----------+-----------+------------------+ MCA                                         Unable to insonate +----------+-------------+----------+-----------+------------------+ ACA                                         Unable to insonate +----------+-------------+----------+-----------+------------------+ Term ICA                                    Unable to insonate +----------+-------------+----------+-----------+------------------+ PCA                                         Unable to insonate +----------+-------------+----------+-----------+------------------+ Opthalmic     22.00                 1.24                       +----------+-------------+----------+-----------+------------------+ ICA siphon    57.00                 0.52                       +----------+-------------+----------+-----------+------------------+ Vertebral                                   Unable to insonate +----------+-------------+----------+-----------+------------------+  +----------+------------+----------+-----------+------------------+ LEFT TCD  Left VM (cm)Depth (cm)Pulsatility     Comment       +----------+------------+----------+-----------+------------------+ MCA          21.00                 1.03                       +----------+------------+----------+-----------+------------------+ ACA          -35.00                1.10                       +----------+------------+----------+-----------+------------------+  Term ICA                                   Unable to insonate +----------+------------+----------+-----------+------------------+ PCA          17.00                 1.22                        +----------+------------+----------+-----------+------------------+ Opthalmic    22.00                 1.14                       +----------+------------+----------+-----------+------------------+ ICA siphon   53.00                 0.99                       +----------+------------+----------+-----------+------------------+ Vertebral                                  Unable to insonate +----------+------------+----------+-----------+------------------+  +------------+-------+------------------+             VM cm/s     Comment       +------------+-------+------------------+ Prox Basilar       Unable to insonate +------------+-------+------------------+    Preliminary        Assessment/Plan: Diagnosis: Right temporal lobe parenchymal hematoma with ?extension vs infarct Labs and images (see above) independently reviewed.  Records reviewed and summated above.   1. Does the need for close, 24 hr/day medical supervision in concert with the patient's rehab needs make it unreasonable for this patient to be served in a less intensive setting? Yes 2. Co-Morbidities requiring supervision/potential complications: tachypnea (monitor RR and O2 Sats with increased physical exertion), Tachycardia (monitor in accordance with pain and increasing activity), HTN (monitor and provide prns in accordance with increased physical exertion and pain), leukocytosis (repeat labs, cont to monitor for signs and symptoms of infection, further workup if indicated) 3. Due to bladder management, bowel management, safety, skin/wound care, disease management, medication administration and patient education, does the patient require 24 hr/day rehab nursing? Yes 4. Does the patient require coordinated care of a physician, rehab nurse, therapy disciplines of PT/OT/SLP to address physical and functional deficits in the context of the above medical diagnosis(es)? Yes Addressing deficits in the following areas:  balance, endurance, locomotion, strength, transferring, bathing, dressing, feeding, grooming, toileting, cognition, speech, language, swallowing and psychosocial support 5. Can the patient actively participate in an intensive therapy program of at least 3 hrs of therapy per day at least 5 days per week? Potentially 6. The potential for patient to make measurable gains while on inpatient rehab is excellent 7. Anticipated functional outcomes upon discharge from inpatient rehab are TBD  with PT, TBD with OT, TBD with SLP. 8. Estimated rehab length of stay to reach the above functional goals is: TBD. 9. Anticipated discharge destination: TBD. 10. Overall Rehab/Functional Prognosis: excellent   RECOMMENDATIONS: This patient's condition is appropriate for continued rehabilitative care in the following setting: Will need to await improvement in medical condition.  Currently, unable to follow commands, however, this is unlikely current level of functioning.  Likely CIR if caregiver support available upon discharge.  Per son, family  considering relocation of patient to [facility]. Patient has agreed to participate in recommended program. Potentially Note that insurance prior authorization may be required for reimbursement for recommended care.   Comment: Rehab Admissions Coordinator to follow up.   I have personally performed a face to face diagnostic evaluation, including, but not limited to relevant history and physical exam findings, of this patient and developed relevant assessment and plan.  Additionally, I have reviewed and concur with the physician assistant's documentation above.    Maryla Morrow, MD, ABPMR Mcarthur Rossetti Angiulli, PA-C 11/22/2019        Revision History                     Routing History

## 2019-12-01 NOTE — Discharge Summary (Signed)
  Physician Discharge Summary  Patient ID: Olivia Mann MRN: [id number] DOB/AGE: 1956/10/12 64 y.o.  Admit date: 11/18/2019 Discharge date: 12/01/2019  Admission Diagnoses:  SAH Seizures   Discharge Diagnoses:  Same Active Problems:   Subarachnoid hematoma (HCC)   Subarachnoid hemorrhage (HCC)   Endotracheally intubated   Macrocytosis without anemia   Encephalopathy acute   Cytotoxic brain edema (HCC)   Seizures (HCC)   SAH (subarachnoid hemorrhage) (HCC)   Tachypnea   Tachycardia   Hypertension   Leukocytosis  Discharged Condition: Stable  Hospital Course:  Olivia Mann is a 64 y.o. female Who presented to the emergency room on November 18, 2019 with seizures and headaches.  She underwent stat CT and subsequent CTA which revealed subarachnoid hemorrhage and right temporal hematoma secondary to ruptured internal carotid artery aneurysm.    She was intubated and admitted to the neuro ICU for further workup and management.  On 11/19/2019 she underwent right frontotemporal craniotomy for clipping of aneurysm,  As well as evacuation of right temporal hematoma.    Immediately postop was noted to have complete left hemiplegia. 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 She was extubated 1/4.  She otherwise had an uncomplicated hospital course. She underwent serial TCDS, poor window, but no evidence of spasms.  She worked with physical and occupational therapy.  She was recommended for CI due to left hemiplegia.Marland Kitchen She was discharged to [facility] for continued rehab on 1/15. She will complete 21 day course of nimotop. Continue keppra due to ?seizure.    Treatments: Surgery  1. Right frontotemporal craniotomy for clipping of internal carotid artery aneurysm, simple 2. Intraoperative ICG video angiography 3. Use of intraoperative microscope for microdissection 4. Evacuation of right temporal hematoma   Discharge Exam: Blood pressure 134/88, pulse 90,  temperature 98 F (36.7 C), temperature source Oral, resp. rate 17, height 5\' 1"  (1.549 m), weight 57 kg, SpO2 92 %. Awake, alert, oriented Speech fluent, appropriate Left hemiplegia Wound c/d/i  Disposition: CIR  Discharge Instructions     Call MD for:  difficulty breathing, headache or visual disturbances   Complete by: As directed    Call MD for:  persistant dizziness or light-headedness   Complete by: As directed    Call MD for:  redness, tenderness, or signs of infection (pain, swelling, redness, odor or green/yellow discharge around incision site)   Complete by: As directed    Call MD for:  severe uncontrolled pain   Complete by: As directed    Call MD for:  temperature >100.4   Complete by: As directed    Increase activity slowly   Complete by: As directed       Allergies as of 12/01/2019   No Known Allergies      Medication List     TAKE these medications    levETIRAcetam 500 MG tablet Commonly known as: Keppra Take 1 tablet (500 mg total) by mouth 2 (two) times daily.   niMODipine 30 MG capsule Commonly known as: NIMOTOP Take 2 capsules (60 mg total) by mouth every 4 (four) hours for 8 days.       Follow-up Information     12/03/2019, MD Follow up.   Specialty: Neurosurgery Contact information: [address] [place] [phone]            Signed: [phone] 12/01/2019, 2:13 PM

## 2019-12-01 NOTE — Progress Notes (Signed)
Jamse Arn, MD  Physician  Physical Medicine and Rehabilitation  PMR Pre-admission  Signed  Date of Service:  11/28/2019  2:50 PM      Related encounter: ED to Hosp-Admission (Current) from 11/18/2019 in [facility]        PMR Admission Coordinator Pre-Admission Assessment   Patient: Olivia Mann is an 64 y.o., female MRN: [id number] DOB: 04-30-1956 Height: 5\' 1"  (154.9 cm) Weight: 57 kg                                                                                                                                                  Insurance Information HMO:     PPO:      PCP:      IPA:      80/20:      OTHER:  PRIMARY: Medicaid [place]      Policy#: [id number]      Subscriber: Patient CM Name:       Phone#:      Fax#:  Pre-Cert#:       Employer:  Benefits:  Phone #: [phone]     Name: DSS-provider portal *Also called [facility] [phone] and spoke to [place] who confirmed Plan First policy (limited medicaid from 1/1/-1/31, then pt to switch to Full Medicaid coverage 12/18/19-10/2020) Eff. Date: Limited Medicaid plan (Plan First) effective 11/17/19-12/17/19; Full Medicaid coverage 12/18/19-10/2020     Deduct:       Out of Pocket Max:       Life Max:  CIR: Pt is essentially self pay as her limited medicaid plan does not cover any hospital coverage or medical bills related to rehab -and pt/family aware of estimated daily cost of care ($3,500).       SNF:  Outpatient:      Co-Pay:  Home Health:       Co-Pay:  DME:      Co-Pay:  Providers:    Pt has limited medicaid plan (Plan First though [place]) which is essentially family planning medicaid with no hospital or medical benefits. Pt will have limited coverage from 1/1-1/31 and will switch to full Medicaid coverage (with hospital coverage) 2/1 and be ongoing til 10/2020. **HOWEVER pt will need to have prior authorization for IP Rehab services starting 2/1. For out  of state pre-auth request, call [facility] [phone] on 2/1. Pt/family aware there is no guarantee she will get approved for CIR even if she is already in rehab at that time and if denied, would remain as self pay ($3,500/day).   SECONDARY:       Policy#:       Subscriber:  CM Name:       Phone#:      Fax#:  Pre-Cert#:  Employer:  Benefits:  Phone #:      Name:  Eff. Date:      Deduct:       Out of Pocket Max:       Life Max:  CIR:       SNF:  Outpatient:      Co-Pay:  Home Health:       Co-Pay:  DME:      Co-Pay:    Medicaid Application Date:       Case Manager:  Disability Application Date:       Case Worker:    The "Data Collection Information Summary" for patients in Inpatient Rehabilitation Facilities with attached "Privacy Act Statement-Health Care Records" was provided and verbally reviewed with: N/A   Emergency Contact Information         Contact Information     Name Relation Home Work [place], [place] Son     [phone]    Donavan Foil Daughter [phone]   [phone]    Tessie Fass Daughter     [phone]    Billie, Trager Spouse     [phone]       Current Medical History  Patient Admitting Diagnosis: Subarachnoid hemorrhage    History of Present Illness: Kery Haltiwanger is a 64 year old female with unremarkable past medical history on no prescription medications.  Presented 11/18/2019 with witnessed seizure after complaints of headache.  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  No hydrocephalus.  Mass-effect with right to left shift of 3 mm.  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  The aneurysm was 2 to 3 mm proximal to the ICA bifurcation.  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  Patient underwent right frontotemporal craniotomy for clipping of internal carotid artery aneurysm as well as evacuation of right temporal hematoma on 11/19/2019 per Dr. Conchita Paris.  Maintained on Keppra for seizure prophylaxis.  Subcutaneous Lovenox added for DVT prophylaxis 11/22/2019. Dyshagia #1 thin liquids as well as nasogastric tube feeds for nutritional support..  Therapy evaluations completed and patient is to be admitted for a comprehensive rehab program on 12/01/19.   Complete NIHSS TOTAL: 18 Glasgow Coma Scale Score: 14   Past Medical History  History reviewed. No pertinent past medical history.   Family History  family history is not on file.   Prior Rehab/Hospitalizations:  Has the patient had prior rehab or hospitalizations prior to admission? No   Has the patient had major surgery during 100 days prior to admission? Yes   Current Medications    Current Facility-Administered Medications:  .  0.9 %  sodium chloride infusion, , Intravenous, Continuous, Dorthea Cove, NP, Last Rate: 75 mL/hr at 12/01/19 0630, New Bag at 12/01/19 0630 .  0.9 %  sodium chloride infusion, , Intravenous, PRN, Meyran, Tiana Loft, NP, Stopped at 11/20/19 1324 .  acetaminophen (TYLENOL) tablet 650 mg, 650 mg, Oral, Q4H PRN **OR** acetaminophen (TYLENOL) 160 MG/5ML solution 650 mg, 650 mg, Per Tube, Q4H PRN, 650 mg at 11/30/19 0859 **OR** acetaminophen (TYLENOL) suppository 650 mg, 650 mg, Rectal, Q4H PRN, Tia Alert, MD .  ALPRAZolam Prudy Feeler) tablet 1 mg, 1 mg, Per Tube, Q6H PRN, Lisbeth Renshaw, MD, 1 mg at  11/29/19 2306 .  chlorhexidine (PERIDEX) 0.12 % solution 15 mL, 15 mL, Mouth Rinse, BID, Lisbeth Renshaw, MD, 15 mL at 12/01/19 0902 .  Chlorhexidine Gluconate Cloth 2 % PADS 6 each, 6 each, Topical, Daily, Lisbeth Renshaw, MD, 6 each at 12/01/19 0903 .  docusate (COLACE) 50 MG/5ML liquid 100 mg, 100 mg, Per Tube, BID, Lisbeth Renshaw, MD, 100 mg at 11/29/19 0918 .   enoxaparin (LOVENOX) injection 40 mg, 40 mg, Subcutaneous, Q24H, Biby, Sharon L, NP, 40 mg at 12/01/19 0902 .  feeding supplement (JEVITY 1.2 CAL) liquid 1,000 mL, 1,000 mL, Per Tube, Continuous, Lisbeth Renshaw, MD, Last Rate: 60 mL/hr at 12/01/19 0630, 1,000 mL at 12/01/19 0630 .  feeding supplement (PRO-STAT SUGAR FREE 64) liquid 30 mL, 30 mL, Per Tube, Daily, Lisbeth Renshaw, MD, 30 mL at 12/01/19 0902 .  guaiFENesin (ROBITUSSIN) 100 MG/5ML solution 100 mg, 5 mL, Per Tube, Q4H PRN, Costella, Vincent J, PA-C, 100 mg at 12/01/19 1446 .  hydrALAZINE (APRESOLINE) injection 5-10 mg, 5-10 mg, Intravenous, Q4H PRN, Costella, Vincent J, PA-C, 10 mg at 11/26/19 2215 .  labetalol (NORMODYNE) injection 5-20 mg, 5-20 mg, Intravenous, Q10 min PRN, Costella, Vincent J, PA-C, 20 mg at 11/23/19 0603 .  levETIRAcetam (KEPPRA) 100 MG/ML solution 1,000 mg, 1,000 mg, Per Tube, BID, Lisbeth Renshaw, MD, 1,000 mg at 12/01/19 0902 .  MEDLINE mouth rinse, 15 mL, Mouth Rinse, q12n4p, Lisbeth Renshaw, MD, 15 mL at 12/01/19 1522 .  niMODipine (NIMOTOP) capsule 60 mg, 60 mg, Oral, Q4H **OR** niMODipine (NYMALIZE) 6 MG/ML oral solution 60 mg, 60 mg, Per Tube, Q4H, Tia Alert, MD, 60 mg at 12/01/19 1447 .  ondansetron (ZOFRAN-ODT) disintegrating tablet 4 mg, 4 mg, Oral, Q6H PRN **OR** ondansetron (ZOFRAN) injection 4 mg, 4 mg, Intravenous, Q6H PRN, Tia Alert, MD, 4 mg at 11/28/19 1001 .  oxyCODONE (ROXICODONE) 5 MG/5ML solution 5-10 mg, 5-10 mg, Per Tube, Q3H PRN, Costella, Vincent J, PA-C, 10 mg at 12/01/19 1446 .  pantoprazole (PROTONIX) EC tablet 40 mg, 40 mg, Oral, Daily **OR** pantoprazole sodium (PROTONIX) 40 mg/20 mL oral suspension 40 mg, 40 mg, Per Tube, Daily, Tia Alert, MD, 40 mg at 12/01/19 0902 .  phenol (CHLORASEPTIC) mouth spray 1 spray, 1 spray, Mouth/Throat, PRN, Lisbeth Renshaw, MD, 1 spray at 11/26/19 1050   Patients Current Diet:     Diet Order                      DIET -  DYS 1 Room service appropriate? Yes; Fluid consistency: Thin  Diet effective now                   Precautions / Restrictions Precautions Precautions: Fall, Other (comment) Precaution Comments: L neglect, L hemiparesis Restrictions Weight Bearing Restrictions: No    Has the patient had 2 or more falls or a fall with injury in the past year?No   Prior Activity Level Community (5-7x/wk): worked as Engineer, manufacturing systems. did drive, was Independent PTA. No AD use   Prior Functional Level Prior Function Level of Independence: Independent   Self Care: Did the patient need help bathing, dressing, using the toilet or eating?  Independent   Indoor Mobility: Did the patient need assistance with walking from room to room (with or without device)? Independent   Stairs: Did the patient need assistance with internal or external stairs (with or without device)? Independent   Functional Cognition: Did the patient need help planning regular  tasks such as shopping or remembering to take medications? Independent   Home Assistive Devices / Equipment Home Equipment: None   Prior Device Use: Indicate devices/aids used by the patient prior to current illness, exacerbation or injury? None of the above   Current Functional Level Cognition   Arousal/Alertness: Lethargic Overall Cognitive Status: Impaired/Different from baseline Difficult to assess due to: Level of arousal Current Attention Level: Focused Orientation Level: Oriented to person, Oriented to place, Oriented to situation, Disoriented to time Following Commands: Follows one step commands inconsistently, Follows one step commands with increased time Safety/Judgement: Decreased awareness of safety, Decreased awareness of deficits General Comments: requires increased time and multimodal cues for command following, safety with mobility.  Attention: Sustained, Focused Focused Attention: Impaired Focused Attention Impairment: Functional  complex Sustained Attention: Appears intact Memory: (Unable to evalute ) Awareness: (Unable to evalute ) Problem Solving: (Unable to evalute ) Executive Function: (Unable to evalute ) Safety/Judgment: Appears intact    Extremity Assessment (includes Sensation/Coordination)   Upper Extremity Assessment: Generalized weakness, LUE deficits/detail LUE Deficits / Details: L hemiplegia LUE Coordination: decreased fine motor, decreased gross motor  Lower Extremity Assessment: Defer to PT evaluation, Generalized weakness, LLE deficits/detail LLE Deficits / Details: no active movement, flaccidity     ADLs   Overall ADL's : Needs assistance/impaired Eating/Feeding: NPO Grooming: Maximal assistance, Total assistance, Sitting, Cueing for safety, Cueing for sequencing Upper Body Bathing: Maximal assistance, Total assistance, Sitting Lower Body Bathing: Total assistance, +2 for physical assistance, +2 for safety/equipment, Sitting/lateral leans, Bed level Upper Body Dressing : Maximal assistance, Total assistance, Cueing for safety, Cueing for sequencing, Sitting, Bed level Lower Body Dressing: Total assistance, +2 for physical assistance, +2 for safety/equipment, Cueing for safety, Sitting/lateral leans, Bed level Toilet Transfer: Moderate assistance, +2 for physical assistance, +2 for safety/equipment, Stand-pivot Toilet Transfer Details (indicate cue type and reason): deferred Toileting- Clothing Manipulation and Hygiene: Moderate assistance, +2 for physical assistance, +2 for safety/equipment, Sitting/lateral lean, Sit to/from stand, Cueing for safety Functional mobility during ADLs: Moderate assistance, +2 for physical assistance, +2 for safety/equipment, Cueing for safety General ADL Comments: Pt limited by L hemiplegia, decreased ability to care for self and decreased attention to follow commands     Mobility   Overal bed mobility: Needs Assistance Bed Mobility: Sidelying to Sit Rolling:  Supervision Sidelying to sit: Mod assist, +2 for safety/equipment Supine to sit: Mod assist Sit to supine: Mod assist, +2 for physical assistance General bed mobility comments: Pt requiring assist for LUE/LLE management, max cues for using rail to pull up with RUE, pt with RLE externally rotated underneath her, needing manual assist to return to neutral positioning once sitting EOB.     Transfers   Overall transfer level: Needs assistance Equipment used: 2 person hand held assist Transfer via Lift Equipment: Stedy Transfers: Sit to/from Stand Sit to Stand: From elevated surface, Mod assist, +2 safety/equipment Stand pivot transfers: Mod assist, +2 physical assistance, +2 safety/equipment, From elevated surface General transfer comment: ModA to power up with LLE blocked. Tactile/verbal cueing for hip extension, left knee extension, cervical extension. Pt with decreased ability to weight shift. ModA + 2 for pivoting towards right side      Ambulation / Gait / Stairs / Wheelchair Mobility   Ambulation/Gait General Gait Details: unable     Posture / Balance Dynamic Sitting Balance Sitting balance - Comments: able to sit EOB with RUE support; propping on R elbow x3 with good righting to midline with verbal cuing. AP, lateral  leaning righting reactions present but with increased time to perform and occasional min assist to complete. Balance Overall balance assessment: Needs assistance Sitting-balance support: Single extremity supported, Feet supported Sitting balance-Leahy Scale: Fair Sitting balance - Comments: able to sit EOB with RUE support; propping on R elbow x3 with good righting to midline with verbal cuing. AP, lateral leaning righting reactions present but with increased time to perform and occasional min assist to complete. Postural control: Left lateral lean Standing balance support: No upper extremity supported, During functional activity Standing balance-Leahy Scale:  Zero Standing balance comment: maxA to stand upright with LLE blocked     Special needs/care consideration BiPAP/CPAP: no CPM: no Continuous Drip IV: 0.9% sodium chloride infusion ; feeding supplement  Dialysis: no        Days: no Life Vest: no Oxygen: no, on RA Special Bed: no Trach Size: no Wound Vac (area): no      Location: no Skin: skin tear to left arm, surgical incision to head  Bowel mgmt:last BM 12/01/19, continent Bladder mgmt: continent Diabetic mgmt: no Behavioral consideration : restless, impulsive at times Chemo/radiation : no        Previous Home Environment (from acute therapy documentation) Type of Home: House Home Layout: One level Home Access: Stairs to enter Secretary/administratorntrance Stairs-Number of Steps: 1 Bathroom Shower/Tub: Health visitorWalk-in shower Bathroom Toilet: Standard Additional Comments: Pt drowsy, but able to answer questions   Discharge Living Setting Plans for Discharge Living Setting: Patient's home, Lives with (comment)(lives with Bethann Berkshirerisha her daugther) Type of Home at Discharge: House Discharge Home Layout: Two level, Able to live on main level with bedroom/bathroom Alternate Level Stairs-Rails: None(as pt can live on first floor) Alternate Level Stairs-Number of Steps: NA(as pt can live on first floor) Discharge Home Access: Stairs to enter Entrance Stairs-Rails: None Entrance Stairs-Number of Steps: 20 total (in segments of 4 steps at a time with landings. long walkway that includes steps_landing_steps_Landing, etc Discharge Bathroom Shower/Tub: Walk-in shower Discharge Bathroom Toilet: Standard Discharge Bathroom Accessibility: Yes How Accessible: Accessible via wheelchair, Accessible via walker Does the patient have any problems obtaining your medications?: No   Social/Family/Support Systems Patient Roles: Other (Comment)(parttime worker; was in middle of retiring. ) SolicitorContact Information: [place]): [phone]; Bethann Berkshirerisha (daughter in [place] who  she lives with): [phone]; Glean HessMarquise (son in [place]): [phone]; There is a husband whom she is separated from (not legally) but he is not involved much Anticipated Caregiver: Bethann Berkshirerisha and Marquise Anticipated Caregiver's Contact Information: see above Ability/Limitations of Caregiver: Min A/Mod A Caregiver Availability: 24/7 Discharge Plan Discussed with Primary Caregiver: Yes(pt and all three children) Is Caregiver In Agreement with Plan?: Yes Does Caregiver/Family have Issues with Lodging/Transportation while Pt is in Rehab?: No     Goals/Additional Needs Patient/Family Goal for Rehab: PT: Min A; OT: Min/Mod A; SLP: Min A Expected length of stay: 20-24 days Cultural Considerations: NA Dietary Needs: DYS 1, thin liquids Equipment Needs: TBD Special Service Needs: pt presents with pushing tendencies  Pt/Family Agrees to Admission and willing to participate: Yes Program Orientation Provided & Reviewed with Pt/Caregiver Including Roles  & Responsibilities: Yes(pt and all three children)  Barriers to Discharge: Home environment access/layout, Incontinence, Insurance for SNF coverage, Nutrition means  Barriers to Discharge Comments: uninsured; up to 20 steps to enter (family open to building ramp). use of [employer] currently.      Decrease burden of Care through IP rehab admission: NA     Possible need for SNF placement upon discharge:Not  anticipated. Pt has a very devoted family with members who are able to do construction to home set up to improve access (ramp, bathroom remodel), and members who can physically assist 24/7. Anticipate pt can make measurable gains during CIR stay which would allow her to return home with 1 person assist safely.      Patient Condition: This patient's medical and functional status has changed since the consult dated: 11/22/19 in which the Rehabilitation Physician determined and documented that the patient's condition is appropriate for intensive rehabilitative  care in an inpatient rehabilitation facility. See "History of Present Illness" (above) for medical update. Functional changes are: improvement in bed mobility from Max A +2 to Mod A +2, improvement in transfers from unsafe to Mod A + 2, and improvement in swallowing function from NPO to DYS 1, thin liquids. Patient's medical and functional status update has been discussed with the Rehabilitation physician and patient remains appropriate for inpatient rehabilitation. Will admit to inpatient rehab today.   Preadmission Screen Completed By:  Cheri Rous, OT, 12/01/2019 4:00 PM ______________________________________________________________________   Discussed status with Dr. Allena Katz on 12/01/19 at 4:00PM and received approval for admission today.   Admission Coordinator:  Cheri Rous, time 4:00PM/Date1/15/21         Revision History Date/Time User Provider Type Action  12/01/2019  4:14 PM Marcello Fennel, MD Physician Sign  12/01/2019  4:01 PM Cheri Rous, OT Rehab Admission Coordinator Share   View Details Report

## 2019-12-01 NOTE — Progress Notes (Signed)
  Speech Language Pathology Treatment: Dysphagia  Patient Details Name: Olivia Mann MRN: [id number] DOB: 23-Feb-1956 Today's Date: 12/01/2019 Time: 0851-0900 SLP Time Calculation (min) (ACUTE ONLY): 9 min  Assessment / Plan / Recommendation Clinical Impression  Pt is very alert this morning, but remains disoriented to time, impulsive, inattentive to her left. NT was assisting pt with breakfast upon SLP arrival. Pt was partially reclined in bed; NT says that she has trouble maintaining an upright position. After repositioning and elevating foot of bed she seems to stay more upright (although she does like to turn onto her R side). Min cues were given to pt for lingual sweep and single sips of thin liquids. She cleared her throat once, but NT reports otherwise good tolerance of breakfast as well as meals on previous date. SLP reinforced strategies/precautions with pt and NT. Pt needed to use the commode with nursing present to assist so session was kept brief. Will continue to follow - recommend CIR for cognitive and swallowing therapy.   HPI HPI: Pt is a 64 y.o. female with no significant PMHX presenting with headache, new onset seizures, agitation, and AMS. 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 ETT 1/3-1/4. No known PMH.      SLP Plan  Continue with current plan of care       Recommendations  Diet recommendations: Dysphagia 1 (puree);Thin liquid Liquids provided via: Cup;Straw Medication Administration: Crushed with puree Supervision: Staff to assist with self feeding;Full supervision/cueing for compensatory strategies Compensations: Minimize environmental distractions;Slow rate;Small sips/bites;Lingual sweep for clearance of pocketing Postural Changes and/or Swallow Maneuvers: Seated upright 90 degrees                Oral Care Recommendations: Oral care BID Follow up  Recommendations: Inpatient Rehab SLP Visit Diagnosis: Dysphagia, oropharyngeal phase (R13.12) Plan: Continue with current plan of care       GO                 Mahala Menghini., M.A. CCC-SLP Acute Rehabilitation Services Pager [phone] Office [phone]  12/01/2019, 9:08 AM

## 2019-12-01 NOTE — Progress Notes (Signed)
Inpatient Rehabilitation-Admissions Coordinator   Received notification that [place]. When [place] checked, was told her current plan is family planning and does not cover hospital stay. Reviewed this with pt's family who still would like to proceed with CIR knowing she would be responsible for her IP Rehab stay (reviewed estimated daily cost of care).   I have received consent from attending service for admit to CIR. I have a bed available and will plan for admit to CIR today. RN and TOC aware.   Please call if questions.   Cheri Rous, OTR/L  Rehab Admissions Coordinator  [phone] 12/01/2019 2:09 PM

## 2019-12-01 NOTE — Progress Notes (Signed)
   12/01/19 1826  What Happened  Was fall witnessed? No  Was patient injured? No  Patient found on floor  Found by Staff-comment  Stated prior activity other (comment)  Follow Up  MD notified Doran Durand NP  Time MD notified [phone]  Family notified Yes - comment  Time family notified 1843  Additional tests No  Simple treatment Other (comment) (none needed )

## 2019-12-01 NOTE — Progress Notes (Addendum)
  NEUROSURGERY PROGRESS NOTE   No issues overnight. No concerns  EXAM:  BP 127/87 (BP Location: Right Arm)   Pulse 75   Temp 98.7 F (37.1 C) (Oral)   Resp 16   Ht 5\' 1"  (1.549 m)   Wt 57 kg   SpO2 97%   BMI 23.74 kg/m   Awake, alert, oriented  Speech fluent, appropriate  Stable left facial droop/left hemiplegia Wound boggy, no drainage  IMPRESSION/PLAN 64 y.o. female SAH d# 13 s/p clipping right Pcom. Stable exam with left hemiplegia. - continue supportive care - CIR when bed available  - Staples need to be removed in 3-5 days. Please call if you would like NS to remove

## 2019-12-01 NOTE — TOC Transition Note (Addendum)
Transition of Care [facility]) - CM/SW Discharge Note Donn Pierini RN,BSN Transitions of Care Unit 4NP (non trauma) - RN Case Manager [phone]   Patient Details  Name: Olivia Mann MRN: [id number] Date of Birth: 1955/12/26  Transition of Care [facility]) CM/SW Contact:  Darrold Span, RN Phone Number: 12/01/2019, 2:22 PM   Clinical Narrative:    Notified by Tresa Endo with [facility] that they are able to take pt to INPT rehab today, [place]. Pt stable for transition to rehab today.    Final next level of care: IP Rehab Facility Barriers to Discharge: Barriers Resolved   Patient Goals and CMS Choice Patient states their goals for this hospitalization and ongoing recovery are:: rehab      Discharge Placement                [facility] re[facility]        Discharge Plan and Services     Post Acute Care Choice: IP Rehab                               Social Determinants of Health (SDOH) Interventions     Readmission Risk Interventions No flowsheet data found.

## 2019-12-02 ENCOUNTER — Other Ambulatory Visit: Payer: Self-pay

## 2019-12-02 ENCOUNTER — Encounter (HOSPITAL_COMMUNITY): Payer: Self-pay | Admitting: Physical Medicine & Rehabilitation

## 2019-12-02 ENCOUNTER — Inpatient Hospital Stay (HOSPITAL_COMMUNITY)
Admission: RE | Admit: 2019-12-02 | Discharge: 2019-12-22 | DRG: 057 | Disposition: A | Discharge status: Home Health | Payer: Medicaid - Out of State | Source: Intra-hospital | Attending: Physical Medicine & Rehabilitation | Admitting: Physical Medicine & Rehabilitation

## 2019-12-02 ENCOUNTER — Inpatient Hospital Stay (HOSPITAL_COMMUNITY): Payer: Medicaid - Out of State | Admitting: Occupational Therapy

## 2019-12-02 ENCOUNTER — Inpatient Hospital Stay (HOSPITAL_COMMUNITY): Payer: Medicaid - Out of State | Admitting: Speech Pathology

## 2019-12-02 DIAGNOSIS — R251 Tremor, unspecified: Secondary | ICD-10-CM | POA: Diagnosis not present

## 2019-12-02 DIAGNOSIS — R509 Fever, unspecified: Secondary | ICD-10-CM

## 2019-12-02 DIAGNOSIS — Z716 Tobacco abuse counseling: Secondary | ICD-10-CM

## 2019-12-02 DIAGNOSIS — I69091 Dysphagia following nontraumatic subarachnoid hemorrhage: Secondary | ICD-10-CM

## 2019-12-02 DIAGNOSIS — E876 Hypokalemia: Secondary | ICD-10-CM | POA: Diagnosis present

## 2019-12-02 DIAGNOSIS — Z8679 Personal history of other diseases of the circulatory system: Secondary | ICD-10-CM

## 2019-12-02 DIAGNOSIS — F419 Anxiety disorder, unspecified: Secondary | ICD-10-CM | POA: Diagnosis present

## 2019-12-02 DIAGNOSIS — R451 Restlessness and agitation: Secondary | ICD-10-CM | POA: Diagnosis not present

## 2019-12-02 DIAGNOSIS — I69354 Hemiplegia and hemiparesis following cerebral infarction affecting left non-dominant side: Secondary | ICD-10-CM

## 2019-12-02 DIAGNOSIS — I69054 Hemiplegia and hemiparesis following nontraumatic subarachnoid hemorrhage affecting left non-dominant side: Principal | ICD-10-CM

## 2019-12-02 DIAGNOSIS — I609 Nontraumatic subarachnoid hemorrhage, unspecified: Secondary | ICD-10-CM

## 2019-12-02 DIAGNOSIS — W19XXXA Unspecified fall, initial encounter: Secondary | ICD-10-CM | POA: Diagnosis not present

## 2019-12-02 DIAGNOSIS — Z87891 Personal history of nicotine dependence: Secondary | ICD-10-CM

## 2019-12-02 DIAGNOSIS — I69391 Dysphagia following cerebral infarction: Secondary | ICD-10-CM

## 2019-12-02 DIAGNOSIS — Z9889 Other specified postprocedural states: Secondary | ICD-10-CM

## 2019-12-02 DIAGNOSIS — Z298 Encounter for other specified prophylactic measures: Secondary | ICD-10-CM

## 2019-12-02 DIAGNOSIS — I10 Essential (primary) hypertension: Secondary | ICD-10-CM | POA: Diagnosis present

## 2019-12-02 DIAGNOSIS — K5900 Constipation, unspecified: Secondary | ICD-10-CM | POA: Diagnosis not present

## 2019-12-02 DIAGNOSIS — R1312 Dysphagia, oropharyngeal phase: Secondary | ICD-10-CM | POA: Diagnosis present

## 2019-12-02 DIAGNOSIS — R5381 Other malaise: Secondary | ICD-10-CM | POA: Diagnosis present

## 2019-12-02 DIAGNOSIS — Y92230 Patient room in hospital as the place of occurrence of the external cause: Secondary | ICD-10-CM | POA: Diagnosis not present

## 2019-12-02 DIAGNOSIS — R569 Unspecified convulsions: Secondary | ICD-10-CM | POA: Diagnosis present

## 2019-12-02 LAB — CBC
HCT: 37.2 % (ref 36.0–46.0)
Hemoglobin: 12.5 g/dL (ref 12.0–15.0)
MCH: 33.6 pg (ref 26.0–34.0)
MCHC: 33.6 g/dL (ref 30.0–36.0)
MCV: 100 fL (ref 80.0–100.0)
Platelets: 539 10*3/uL — ABNORMAL HIGH (ref 150–400)
RBC: 3.72 MIL/uL — ABNORMAL LOW (ref 3.87–5.11)
RDW: 13.7 % (ref 11.5–15.5)
WBC: 9.1 10*3/uL (ref 4.0–10.5)
nRBC: 0 % (ref 0.0–0.2)

## 2019-12-02 LAB — CREATININE, SERUM
Creatinine, Ser: 0.54 mg/dL (ref 0.44–1.00)
GFR calc Af Amer: >60 mL/min (ref >60)
GFR calc non Af Amer: >60 mL/min (ref >60)

## 2019-12-02 LAB — GLUCOSE, CAPILLARY
Glucose-Capillary: 101 mg/dL — ABNORMAL HIGH (ref 70–99)
Glucose-Capillary: 108 mg/dL — ABNORMAL HIGH (ref 70–99)
Glucose-Capillary: 111 mg/dL — ABNORMAL HIGH (ref 70–99)
Glucose-Capillary: 112 mg/dL — ABNORMAL HIGH (ref 70–99)
Glucose-Capillary: 84 mg/dL (ref 70–99)

## 2019-12-02 MED ORDER — OXYCODONE HCL 5 MG/5ML PO SOLN
5.0000 mg | ORAL | Status: DC | PRN
  Administered 2019-12-02: 10 mg
  Administered 2019-12-02 (×2): 5 mg
  Administered 2019-12-02 – 2019-12-05 (×10): 10 mg
  Administered 2019-12-06: 5 mg
  Administered 2019-12-07: 10 mg
  Administered 2019-12-08 – 2019-12-09 (×2): 5 mg
  Administered 2019-12-09 – 2019-12-16 (×2): 10 mg
  Filled 2019-12-02: qty 10
  Filled 2019-12-02 (×2): qty 5
  Filled 2019-12-02 (×4): qty 10
  Filled 2019-12-02: qty 5
  Filled 2019-12-02 (×14): qty 10

## 2019-12-02 MED ORDER — PANTOPRAZOLE SODIUM 40 MG PO TBEC
40.0000 mg | DELAYED_RELEASE_TABLET | Freq: Every day | ORAL | Status: DC
  Administered 2019-12-02 – 2019-12-22 (×16): 40 mg via ORAL
  Filled 2019-12-02 (×19): qty 1

## 2019-12-02 MED ORDER — DOCUSATE SODIUM 50 MG/5ML PO LIQD
100.0000 mg | Freq: Two times a day (BID) | ORAL | Status: DC
  Administered 2019-12-02 – 2019-12-19 (×35): 100 mg
  Filled 2019-12-02 (×35): qty 10

## 2019-12-02 MED ORDER — NIMODIPINE 6 MG/ML PO SOLN
60.0000 mg | ORAL | Status: AC
  Administered 2019-12-02 – 2019-12-09 (×47): 60 mg
  Filled 2019-12-02 (×50): qty 10

## 2019-12-02 MED ORDER — ACETAMINOPHEN 160 MG/5ML PO SOLN
650.0000 mg | ORAL | Status: DC | PRN
  Administered 2019-12-02 – 2019-12-06 (×6): 650 mg
  Filled 2019-12-02 (×7): qty 20.3

## 2019-12-02 MED ORDER — ONDANSETRON HCL 4 MG/2ML IJ SOLN
4.0000 mg | Freq: Four times a day (QID) | INTRAMUSCULAR | Status: DC | PRN
  Administered 2019-12-04: 4 mg via INTRAVENOUS
  Filled 2019-12-02: qty 2

## 2019-12-02 MED ORDER — ENOXAPARIN SODIUM 40 MG/0.4ML ~~LOC~~ SOLN: 40.0000 mg | SUBCUTANEOUS | Status: DC

## 2019-12-02 MED ORDER — OSMOLITE 1.2 CAL PO LIQD
1000.0000 mL | ORAL | Status: DC
  Administered 2019-12-02 (×2): 1000 mL
  Filled 2019-12-02 (×2): qty 1000

## 2019-12-02 MED ORDER — ENOXAPARIN SODIUM 40 MG/0.4ML ~~LOC~~ SOLN
40.0000 mg | Freq: Every day | SUBCUTANEOUS | Status: DC
  Administered 2019-12-02 – 2019-12-22 (×21): 40 mg via SUBCUTANEOUS
  Filled 2019-12-02 (×21): qty 0.4

## 2019-12-02 MED ORDER — INSULIN ASPART 100 UNIT/ML ~~LOC~~ SOLN
0.0000 [IU] | SUBCUTANEOUS | Status: DC
  Administered 2019-12-04 – 2019-12-15 (×7): 1 [IU] via SUBCUTANEOUS

## 2019-12-02 MED ORDER — ACETAMINOPHEN 325 MG PO TABS
650.0000 mg | ORAL_TABLET | ORAL | Status: DC | PRN
  Administered 2019-12-03 – 2019-12-21 (×2): 650 mg via ORAL
  Filled 2019-12-02 (×2): qty 2

## 2019-12-02 MED ORDER — ONDANSETRON 4 MG PO TBDP: 4.0000 mg | ORAL_TABLET | Freq: Four times a day (QID) | ORAL | Status: DC | PRN

## 2019-12-02 MED ORDER — JEVITY 1.2 CAL PO LIQD
1000.0000 mL | ORAL | Status: DC
  Administered 2019-12-02: 1000 mL
  Filled 2019-12-02: qty 1000

## 2019-12-02 MED ORDER — ALPRAZOLAM 0.5 MG PO TABS
1.0000 mg | ORAL_TABLET | Freq: Four times a day (QID) | ORAL | Status: DC | PRN
  Administered 2019-12-02 – 2019-12-21 (×32): 1 mg
  Filled 2019-12-02 (×33): qty 2

## 2019-12-02 MED ORDER — PRO-STAT SUGAR FREE PO LIQD
30.0000 mL | Freq: Every day | ORAL | Status: DC
  Administered 2019-12-02 – 2019-12-04 (×3): 30 mL
  Filled 2019-12-02 (×3): qty 30

## 2019-12-02 MED ORDER — LEVETIRACETAM 100 MG/ML PO SOLN
1000.0000 mg | Freq: Two times a day (BID) | ORAL | Status: DC
  Administered 2019-12-02 – 2019-12-19 (×35): 1000 mg
  Filled 2019-12-02 (×35): qty 10

## 2019-12-02 MED ORDER — SODIUM CHLORIDE 0.9 % IV SOLN: INTRAVENOUS | Status: DC

## 2019-12-02 MED ORDER — ACETAMINOPHEN 650 MG RE SUPP: 650.0000 mg | RECTAL | Status: DC | PRN

## 2019-12-02 MED ORDER — PANTOPRAZOLE SODIUM 40 MG PO PACK
40.0000 mg | PACK | Freq: Every day | ORAL | Status: DC
  Administered 2019-12-05 – 2019-12-14 (×5): 40 mg
  Filled 2019-12-02 (×5): qty 20

## 2019-12-02 MED ORDER — NIMODIPINE 30 MG PO CAPS
60.0000 mg | ORAL_CAPSULE | ORAL | Status: AC
  Administered 2019-12-04 – 2019-12-10 (×3): 60 mg via ORAL
  Filled 2019-12-02 (×11): qty 2

## 2019-12-02 NOTE — H&P (Addendum)
Physical Medicine and Rehabilitation Admission H&P    Chief Complaint  Patient presents with  . Seizures  : HPI: Olivia Mann is a 64 year old right-handed female with unremarkable past medical history on no prescription medications.  History taken from chart review due to lethargy.  She presented on 11/18/2019 with seizures and headaches.  Cranial CT revealed subarachnoid hemorrhage.  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  No hydrocephalus.  Mass-effect with right to left shift of 3 mm.  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  The aneurysm was 2 to 3 mm proximal to the ICA bifurcation.  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  Patient underwent right frontotemporal craniotomy for clipping of internal carotid artery aneurysm as well as evacuation of right temporal hematoma on 12/16/2019 by Dr. Conchita Paris.  She was continued on Keppra for seizure prophylaxis.  Subcutaneous Lovenox added for DVT prophylaxis 11/22/2019.  Hospital course complicated by dysphagia, and she was started on Dyshagia #1 thin liquids as well as nasogastric tube feeds for nutritional support..  Therapy evaluations completed and patient was admitted for a comprehensive rehab program.  Please see preadmission assessment from yesterday as well.  Patient was to be admitted yesterday, however due to transportation issues and bed availability, patient was not transferred until early this a.m.  Discussed with overnight charge nurse. She slept fairly well overnight.   Review of Systems  Unable to perform ROS: Mental acuity   History reviewed. No pertinent past medical history.,  Unable to obtain from patient due to lethargy. Past Surgical History:  Procedure Laterality Date  . CRANIOTOMY  Right 11/19/2019   Procedure: CRANIOTOMY CLIPPING OF CAROTID ANEURYSM;  Surgeon: Lisbeth Renshaw, MD;  Location: [facility] OR;  Service: Neurosurgery;  Laterality: Right;   History reviewed. No pertinent family history.,  Unable to obtain from patient due to lethargy Social History:  has no history on file for tobacco, alcohol, and drug.,  Unable to obtain from patient due to lethargy Allergies: No Known Allergies No medications prior to admission.    Drug Regimen Review Drug regimen was reviewed and remains appropriate with no significant issues identified  Home: Home Living Family/patient expects to be discharged to:: Private residence Type of Home: House Home Access: Stairs to enter Secretary/administrator of Steps: 1 Home Layout: One level Bathroom Shower/Tub: Health visitor: Standard Home Equipment: None Additional Comments: Pt drowsy, but able to answer questions   Functional History: Prior Function Level of Independence: Independent  Functional Status:  Mobility: Bed Mobility Overal bed mobility: Needs Assistance Bed Mobility: Sidelying to Sit Rolling: Supervision Sidelying to sit: Mod assist, +2 for safety/equipment Supine to sit: Mod assist Sit to supine: Mod assist, +2 for physical assistance General bed mobility comments: Pt requiring assist for LUE/LLE management, max cues for using rail to pull up with RUE, pt with RLE externally rotated underneath her, needing manual assist to return to neutral positioning once sitting EOB. Transfers Overall transfer level: Needs assistance Equipment used: 2 person hand held assist Transfer via Lift Equipment: Stedy Transfers: Sit to/from Stand Sit to Stand: From elevated surface, Mod assist, +2 safety/equipment Stand pivot transfers: Mod assist, +2 physical assistance, +2 safety/equipment, From elevated surface General transfer comment: ModA  to power up with LLE blocked. Tactile/verbal cueing for hip extension,  left knee extension, cervical extension. Pt with decreased ability to weight shift. ModA + 2 for pivoting towards right side  Ambulation/Gait General Gait Details: unable    ADL: ADL Overall ADL's : Needs assistance/impaired Eating/Feeding: NPO Grooming: Maximal assistance, Total assistance, Sitting, Cueing for safety, Cueing for sequencing Upper Body Bathing: Maximal assistance, Total assistance, Sitting Lower Body Bathing: Total assistance, +2 for physical assistance, +2 for safety/equipment, Sitting/lateral leans, Bed level Upper Body Dressing : Maximal assistance, Total assistance, Cueing for safety, Cueing for sequencing, Sitting, Bed level Lower Body Dressing: Total assistance, +2 for physical assistance, +2 for safety/equipment, Cueing for safety, Sitting/lateral leans, Bed level Toilet Transfer: Moderate assistance, +2 for physical assistance, +2 for safety/equipment, Stand-pivot Toilet Transfer Details (indicate cue type and reason): deferred Toileting- Clothing Manipulation and Hygiene: Moderate assistance, +2 for physical assistance, +2 for safety/equipment, Sitting/lateral lean, Sit to/from stand, Cueing for safety Functional mobility during ADLs: Moderate assistance, +2 for physical assistance, +2 for safety/equipment, Cueing for safety General ADL Comments: Pt limited by L hemiplegia, decreased ability to care for self and decreased attention to follow commands  Cognition: Cognition Overall Cognitive Status: Impaired/Different from baseline Arousal/Alertness: Lethargic Orientation Level: Oriented to person, Oriented to place, Oriented to situation, Disoriented to time Attention: Sustained, Focused Focused Attention: Impaired Focused Attention Impairment: Functional complex Sustained Attention: Appears intact Memory: (Unable to evalute ) Awareness: (Unable to evalute ) Problem Solving: (Unable to evalute ) Executive Function: (Unable to evalute ) Safety/Judgment: Appears  intact Cognition Arousal/Alertness: Awake/alert Behavior During Therapy: Flat affect Overall Cognitive Status: Impaired/Different from baseline Area of Impairment: Attention, Following commands, Safety/judgement, Awareness, Problem solving Current Attention Level: Focused Following Commands: Follows one step commands inconsistently, Follows one step commands with increased time Safety/Judgement: Decreased awareness of safety, Decreased awareness of deficits Awareness: Intellectual Problem Solving: Slow processing, Difficulty sequencing, Requires verbal cues, Requires tactile cues, Decreased initiation General Comments: requires increased time and multimodal cues for command following, safety with mobility.  Difficult to assess due to: Level of arousal  Physical Exam: Blood pressure 134/88, pulse 90, temperature 98 F (36.7 C), temperature source Oral, resp. rate 17, height 5\' 1"  (1.549 m), weight 57 kg, SpO2 92 %. Physical Exam  Vitals reviewed. Constitutional: She appears well-developed and well-nourished.  HENT:  craniiotomy site clean and dry with clips + NT  Eyes: Right eye exhibits no discharge. Left eye exhibits no discharge.  Keeps eyes closed  Neck: No tracheal deviation present. No thyromegaly present.  Respiratory: Effort normal. No respiratory distress.  GI: Soft. She exhibits no distension.  Musculoskeletal:     Comments: Lower extremity edema  Neurological:  Lethargic Right gaze preference Limited awareness of deficits Motor: Not following commands  Skin: Skin is warm and dry.  See above  Psychiatric:  Unable to assess due to lethargy   Results for orders placed or performed during the hospital encounter of 11/18/19 (from the past 48 hour(s))  Glucose, capillary     Status: Abnormal   Collection Time: 11/29/19  4:25 PM  Result Value Ref Range   Glucose-Capillary 117 (H) 70 - 99 mg/dL  Glucose, capillary     Status: Abnormal   Collection Time: 11/29/19  8:11  PM  Result Value Ref Range   Glucose-Capillary 106 (H) 70 - 99 mg/dL  Glucose, capillary     Status: Abnormal   Collection Time: 11/29/19 11:40 PM  Result Value Ref Range   Glucose-Capillary 123 (  H) 70 - 99 mg/dL  Glucose, capillary     Status: None   Collection Time: 11/30/19  5:11 AM  Result Value Ref Range   Glucose-Capillary 97 70 - 99 mg/dL  Glucose, capillary     Status: Abnormal   Collection Time: 11/30/19  8:07 AM  Result Value Ref Range   Glucose-Capillary 120 (H) 70 - 99 mg/dL  Glucose, capillary     Status: Abnormal   Collection Time: 11/30/19 11:54 AM  Result Value Ref Range   Glucose-Capillary 103 (H) 70 - 99 mg/dL  Glucose, capillary     Status: Abnormal   Collection Time: 11/30/19  4:06 PM  Result Value Ref Range   Glucose-Capillary 136 (H) 70 - 99 mg/dL  Glucose, capillary     Status: Abnormal   Collection Time: 11/30/19  7:56 PM  Result Value Ref Range   Glucose-Capillary 102 (H) 70 - 99 mg/dL  Glucose, capillary     Status: Abnormal   Collection Time: 12/01/19  8:00 AM  Result Value Ref Range   Glucose-Capillary 106 (H) 70 - 99 mg/dL  Glucose, capillary     Status: Abnormal   Collection Time: 12/01/19 12:20 PM  Result Value Ref Range   Glucose-Capillary 128 (H) 70 - 99 mg/dL   DG Swallowing Func-Speech Pathology  Result Date: 11/30/2019 Objective Swallowing Evaluation: Type of Study: MBS-Modified Barium Swallow Study  Patient Details Name: Tarshia Kot MRN: [id number] Date of Birth: April 18, 1956 Today's Date: 11/30/2019 Time: SLP Start Time (ACUTE ONLY): 1101 -SLP Stop Time (ACUTE ONLY): 1122 SLP Time Calculation (min) (ACUTE ONLY): 21 min Past Medical History: No past medical history on file. Past Surgical History: Past Surgical History: Procedure Laterality Date . CRANIOTOMY Right 11/19/2019  Procedure: CRANIOTOMY CLIPPING OF CAROTID ANEURYSM;  Surgeon: Lisbeth Renshaw, MD;  Location: [facility] OR;  Service: Neurosurgery;  Laterality: Right; HPI: Pt is a 64 y.o. female  with no significant PMHX presenting with headache, new onset seizures, agitation, and AMS. 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 ETT 1/3-1/4. No known PMH.  Subjective: pt is even more alert than she was for me upstairs Assessment / Plan / Recommendation CHL IP CLINICAL IMPRESSIONS 11/30/2019 Clinical Impression Pt has a mild-moderate dysphagia with primarily oral deficits given sensory and motor issues. She is very alert for the study, still needing Min cues to maintain upright positioning. She has reduced left-sided labial seal with anterior spillage primarily of thin liquids and soft solids. Most of a piece of graham cracker fell out of her mouth, appearing to be unmasticated. Her oral transit takes extra time even with purees and there is mild lingual residue, but with extra time she clears this well with a spontaneous second swallow. Pt's pharyngeal swallow was grossly functional, but intermittent premature spillage did result in trace amounts of penetration with thin liquids. This typically happened when she had larger amounts of liquid in her mouth, making it more challenging for her to contain it before she swallowed. Recommend starting with Dys 1 diet and thin liquids with assistance for use of precautions including small sips. Would make sure that she is alert and upright during meals as well. Nectar thick liquids were also tested and could be used as an alternative to thin liquids if performance fluctuates more at bedside. Pt's daughter Crist Infante was present in person for the study in agreement, and  had her sister Larena Glassman join [place] through videochat as well. Will continue to follow.  SLP Visit Diagnosis Dysphagia, oropharyngeal phase (R13.12) Attention and concentration deficit following -- Frontal lobe and executive function deficit following -- Impact on safety and function Mild aspiration risk;Moderate  aspiration risk   CHL IP TREATMENT RECOMMENDATION 11/30/2019 Treatment Recommendations Therapy as outlined in treatment plan below   Prognosis 11/30/2019 Prognosis for Safe Diet Advancement Good Barriers to Reach Goals Cognitive deficits Barriers/Prognosis Comment -- CHL IP DIET RECOMMENDATION 11/30/2019 SLP Diet Recommendations Dysphagia 1 (Puree) solids;Thin liquid Liquid Administration via Straw;Cup Medication Administration Crushed with puree Compensations Slow rate;Small sips/bites;Minimize environmental distractions;Lingual sweep for clearance of pocketing;Monitor for anterior loss;Other (Comment) Postural Changes Seated upright at 90 degrees   CHL IP OTHER RECOMMENDATIONS 11/30/2019 Recommended Consults -- Oral Care Recommendations Oral care BID Other Recommendations Have oral suction available   CHL IP FOLLOW UP RECOMMENDATIONS 11/30/2019 Follow up Recommendations Inpatient Rehab   CHL IP FREQUENCY AND DURATION 11/30/2019 Speech Therapy Frequency (ACUTE ONLY) min 2x/week Treatment Duration 2 weeks      CHL IP ORAL PHASE 11/30/2019 Oral Phase Impaired Oral - Pudding Teaspoon -- Oral - Pudding Cup -- Oral - Honey Teaspoon -- Oral - Honey Cup -- Oral - Nectar Teaspoon -- Oral - Nectar Cup -- Oral - Nectar Straw Delayed oral transit;Decreased bolus cohesion;Premature spillage Oral - Thin Teaspoon -- Oral - Thin Cup Delayed oral transit;Decreased bolus cohesion;Premature spillage;Left anterior bolus loss Oral - Thin Straw Delayed oral transit;Decreased bolus cohesion;Premature spillage Oral - Puree Delayed oral transit;Decreased bolus cohesion;Lingual/palatal residue Oral - Mech Soft Delayed oral transit;Decreased bolus cohesion;Left anterior bolus loss;Impaired mastication Oral - Regular -- Oral - Multi-Consistency -- Oral - Pill -- Oral Phase - Comment --  CHL IP PHARYNGEAL PHASE 11/30/2019 Pharyngeal Phase Impaired Pharyngeal- Pudding Teaspoon -- Pharyngeal -- Pharyngeal- Pudding Cup -- Pharyngeal -- Pharyngeal- Honey  Teaspoon -- Pharyngeal -- Pharyngeal- Honey Cup -- Pharyngeal -- Pharyngeal- Nectar Teaspoon -- Pharyngeal -- Pharyngeal- Nectar Cup -- Pharyngeal -- Pharyngeal- Nectar Straw WFL Pharyngeal -- Pharyngeal- Thin Teaspoon -- Pharyngeal -- Pharyngeal- Thin Cup Penetration/Aspiration before swallow Pharyngeal Material enters airway, remains ABOVE vocal cords and not ejected out Pharyngeal- Thin Straw Penetration/Aspiration before swallow Pharyngeal Material enters airway, remains ABOVE vocal cords and not ejected out Pharyngeal- Puree WFL Pharyngeal -- Pharyngeal- Mechanical Soft WFL Pharyngeal -- Pharyngeal- Regular -- Pharyngeal -- Pharyngeal- Multi-consistency -- Pharyngeal -- Pharyngeal- Pill -- Pharyngeal -- Pharyngeal Comment --  CHL IP CERVICAL ESOPHAGEAL PHASE 11/30/2019 Cervical Esophageal Phase WFL Pudding Teaspoon -- Pudding Cup -- Honey Teaspoon -- Honey Cup -- Nectar Teaspoon -- Nectar Cup -- Nectar Straw -- Thin Teaspoon -- Thin Cup -- Thin Straw -- Puree -- Mechanical Soft -- Regular -- Multi-consistency -- Pill -- Cervical Esophageal Comment -- Osie Bond., M.A. [place] Pager [phone] Office [phone] 11/30/2019, 11:56 AM                Medical Problem List and Plan: 1.  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  Status post right frontotemporal craniotomy for clipping of internal carotid artery aneurysm as well as evacuation of right temporal hematoma 11/19/2019  -patient may not shower  -ELOS/Goals: 17-22 days/supervision/min A.  Admit to CIR 2.  Antithrombotics: -DVT/anticoagulation: Subcutaneous Lovenox initiated 11/22/2019.  Monitor for any bleeding episodes  -antiplatelet therapy: N/A 3. Pain Management: Oxycodone as needed 4. Mood: Xanax 1 mg every 6 hours as needed  -antipsychotic agents: N/A 5. Neuropsych: This patient is not  capable of making decisions on her own behalf. 6. Skin/Wound Care: Routine skin checks 7.  Fluids/Electrolytes/Nutrition: Routine in and outs.  CMP ordered. 8. Seizure prophylaxis.  Continue Keppra 1000 mg twice daily 9.  Post stroke dysphagia: Dysphagia #1 thin liquids.  Nasogastric tube feeds.  Follow-up speech therapy 10. Hypertension.  Completing course of [place].   Monitor with increased mobility  Mcarthur Rossetti Angiulli, PA-C  I have personally performed a face to face diagnostic evaluation, including, but not limited to relevant history and physical exam findings, of this patient and developed relevant assessment and plan.  Additionally, I have reviewed and concur with the physician assistant's documentation above.  Maryla Morrow, MD, ABPMR   The patient's status has not changed. The original post admission physician evaluation remains appropriate, and any changes from the pre-admission screening or documentation from the acute chart are noted above.   Maryla Morrow, MD, ABPMR

## 2019-12-02 NOTE — Progress Notes (Signed)
Pt tranfered onto unit. No c/o at the moment. Patient calm/resting, will continue to monitor.

## 2019-12-02 NOTE — Progress Notes (Signed)
Report given to Sam RN on unit 4 west.Patient to transfer to unit 4 west bed 23.Patient belonging sent with patient to unit 4 west.

## 2019-12-02 NOTE — Plan of Care (Signed)
  Problem: Consults Goal: RH GENERAL PATIENT EDUCATION Description: See Patient Education module for education specifics. Outcome: Progressing   Problem: RH BOWEL ELIMINATION Goal: RH STG MANAGE BOWEL WITH ASSISTANCE Description: STG Manage Bowel with Assistance. Outcome: Progressing   Problem: RH BLADDER ELIMINATION Goal: RH STG MANAGE BLADDER WITH ASSISTANCE Description: STG Manage Bladder With Assistance Outcome: Progressing   Problem: RH SKIN INTEGRITY Goal: RH STG SKIN FREE OF INFECTION/BREAKDOWN Outcome: Progressing   Problem: RH PAIN MANAGEMENT Goal: RH STG PAIN MANAGED AT OR BELOW PT'S PAIN GOAL Outcome: Progressing   

## 2019-12-02 NOTE — Evaluation (Signed)
Physical Therapy Assessment and Plan  Patient Details  Name: Olivia Mann MRN: [id number] Date of Birth: [birth year]  PT Diagnosis: Abnormal posture, Abnormality of gait, Cognitive deficits, Difficulty walking, Hemiplegia non-dominant, Hypotonia, Impaired cognition, Impaired sensation, Muscle weakness and Pain in surgical site Rehab Potential: Good ELOS: ~3.5 weeks    Today's Date: 12/02/2019 PT Individual Time: 1407-1500 PT Individual Time Calculation (min): 53 min    Problem List:  Patient Active Problem List   Diagnosis Date Noted  . Dysphagia, post-stroke   . Seizure prophylaxis   . Hemiparesis affecting left side as late effect of stroke ([place])   . SAH (subarachnoid hemorrhage) ([place])   . Tachypnea   . Tachycardia   . Hypertension   . Leukocytosis   . Cytotoxic brain edema ([place]) 11/20/2019  . Seizures ([place]) 11/20/2019  . Subarachnoid hemorrhage ([place])   . Endotracheally intubated   . Macrocytosis without anemia   . Encephalopathy acute   . Subarachnoid hematoma ([place]) 11/18/2019    Past Medical History: History reviewed. No pertinent past medical history. Past Surgical History:  Past Surgical History:  Procedure Laterality Date  . CRANIOTOMY Right 11/19/2019   Procedure: CRANIOTOMY CLIPPING OF CAROTID ANEURYSM;  Surgeon: Consuella Lose, MD;  Location: [facility];  Service: Neurosurgery;  Laterality: Right;    Assessment & Plan Clinical Impression: Patient is a 64 y.o. year old right-handed female with unremarkable past medical history on no prescription medications.  History taken from chart review due to lethargy.  She presented on 11/18/2019 with seizures and headaches.  Cranial CT revealed subarachnoid hemorrhage.  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  No hydrocephalus.  Mass-effect with right to left shift of 3 mm.  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  The aneurysm was 2 to 3 mm proximal to the ICA bifurcation.  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  Patient underwent right frontotemporal craniotomy for clipping of internal carotid artery aneurysm as well as evacuation of right temporal hematoma on 12/16/2019 by Dr. Kathyrn Sheriff.  She was continued on Keppra for seizure prophylaxis.  Subcutaneous Lovenox added for DVT prophylaxis 11/22/2019.  Hospital course complicated by dysphagia, and she was started on Dyshagia #1 thin liquids as well as nasogastric tube feeds for nutritional support..  Therapy evaluations completed and patient was admitted for a comprehensive rehab program. Patient transferred to CIR on 12/02/2019 .   Patient currently requires max with mobility secondary to muscle weakness and muscle paralysis, decreased cardiorespiratoy endurance, impaired timing and sequencing, abnormal tone and unbalanced muscle activation, decreased attention to left, decreased initiation, decreased attention, decreased awareness, decreased problem solving, decreased safety awareness and delayed processing and decreased sitting balance, decreased standing balance, decreased postural control and decreased balance strategies.  Prior to hospitalization, patient was independent  with mobility and lived with Family, Daughter(lives with dtr Larena Glassman in [place]) in a House home.  Home access is 20 STE per rehab admission documentationStairs to enter.  Patient will benefit from skilled PT intervention to maximize safe functional mobility, minimize fall risk and decrease caregiver burden for planned discharge home with 24 hour assist.  Anticipate patient will benefit from follow up [facility] at discharge.  PT - End of Session Activity Tolerance: Tolerates 10 -  20 min activity with multiple rests Endurance Deficit: Yes Endurance Deficit Description: significant  fatigue throughout session PT Assessment Rehab Potential (ACUTE/IP ONLY): Good PT Barriers to Discharge: Inaccessible home environment;Home environment access/layout;Nutrition means PT Patient demonstrates impairments in the following area(s): Balance;Perception;Behavior;Safety;Edema;Sensory;Endurance;Skin Integrity;Motor;Nutrition;Pain PT Transfers Functional Problem(s): Bed Mobility;Bed to Chair;Car;Furniture PT Locomotion Functional Problem(s): Ambulation;Wheelchair Mobility;Stairs PT Plan PT Intensity: Minimum of 1-2 x/day ,45 to 90 minutes PT Frequency: 5 out of 7 days PT Duration Estimated Length of Stay: ~3.5 weeks PT Treatment/Interventions: Ambulation/gait training;Community reintegration;DME/adaptive equipment instruction;Neuromuscular re-education;Psychosocial support;Stair training;UE/LE Strength taining/ROM;Wheelchair propulsion/positioning;Balance/vestibular training;Discharge planning;Functional electrical stimulation;Pain management;Skin care/wound management;Therapeutic Activities;UE/LE Coordination activities;Cognitive remediation/compensation;Functional mobility training;Disease management/prevention;Patient/family education;Splinting/orthotics;Therapeutic Exercise;Visual/perceptual remediation/compensation PT Transfers Anticipated Outcome(s): CGA PT Locomotion Anticipated Outcome(s): CGA PT Recommendation Follow Up Recommendations: Home health PT;24 hour supervision/assistance Patient destination: Home Equipment Recommended: To be determined  Skilled Therapeutic Intervention Evaluation completed (see details above and below) with education on PT POC and goals and individual treatment initiated with focus on bed mobility, transfers, initiating gait training, activity tolerance, and L attention, as well as education regarding daily therapy schedule, weekly team meetings, purpose of PT evaluation, and other CIR information. Pt received supine in bed with her daughter, [name],  present and pt asleep on/off. Her daughter confirmed the below home living/PLOF information. Pt agreeable to therapy session and RN present to disconnect tube feeds and IV for therapy session. Patient continued to remain drowsy throughout session keeping eyes closed majority of the time only opening them and having increased alertness to complete a task - also demonstrates delayed processing and delayed response to questioning. Pt demonstrates R gaze preference throughout session. Rolling R/L in bed using bedrails with min/mod assist for L hemibody management and pivoting hips. Supine>sit, HOB partially elevated and using bedrail, with mod assist for L hemibody management and trunk upright. Sitting EOB pt able to maintain static sitting balance with CGA/min assist when using R UE support. Sit<>stand EOB<>R UE support on bedrail with mod assist for lifting/balance and therapist blocking L knee buckle - static standing with R UE support on bedrail and min assist. Therapist retrieved TIS w/c. L squat pivot EOB>w/c with max assist for lifting/pivoting hips and L hemibody management - cuing for sequencing. Transported to/from gym in w/c. Gait training ~12f at R hallway rail with R UE support and L UE around therapist's shoulders and max assist for balance and total assist for L LE management to bring it through during swing phase and block knee buckling or hyperextension during stance phase - +2 w/c follow. At stairs came to standing with max assist of 1 and while therapist provided max/total assist for balance and L LE knee control pt attempted to place R foot onto step using R UE support on handrail but unable to pick foot up enough. Transported back to room. R squat pivot w/c>EOB with max assist for lifting/pivoting hips. Sit>supine with mod assist for B LE management. Pt left supine in bed with Speech Therapist present to assume care of patient.   PT Evaluation Precautions/Restrictions Precautions Precautions:  Fall Precaution Comments: L neglect, L hemiparesis Restrictions Weight Bearing Restrictions: No Pain Pain Assessment Pain Scale: 0-10 Pain Score: 0-No pain Home Living/Prior Functioning Home Living Available Help at Discharge: Family;Available 24 hours/day(pt's sister, daughter will provide 24hr support) Type of Home: House Home Access: Stairs to enter [employer] Steps: "a lot" of STE Home Layout: Two level;Able to live on main level with bedroom/bathroom;Full bath on main level(shower with glass door) Bathroom Shower/Tub:  Walk-in shower Bathroom Toilet: Standard Bathroom Accessibility: Yes  Lives With: Family;Daughter(lives w/ daughter Gilmore Laroche in [place]) Prior Function Level of Independence: Independent with homemaking with ambulation;Independent with gait;Independent with transfers  Able to Take Stairs?: Yes Driving: Yes Vocation: Full time employment 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 Vision/Perception  Vision - Assessment Alignment/Gaze Preference: Gaze right Perception Perception: Impaired Inattention/Neglect: Does not attend to left visual field;Does not attend to left side of body Figure Ground: unable to determine height of the step when attempting to place R foot up onto it Praxis Praxis: Impaired Praxis Impairment Details: Initiation;Motor planning;Perseveration;Ideomotor  Cognition Overall Cognitive Status: Impaired/Different from baseline Arousal/Alertness: Lethargic Orientation Level: Oriented to person;Oriented to place;Oriented to situation;Disoriented to time Attention: Focused;Sustained Focused Attention: Impaired Sustained Attention: Impaired Awareness: Impaired Behaviors: Impulsive;Restless Safety/Judgment: Impaired Sensation Sensation Light Touch: Impaired by gross assessment Hot/Cold: Not tested Proprioception: Impaired by gross assessment Stereognosis: Not tested Coordination Gross Motor Movements are Fluid  and Coordinated: No Fine Motor Movements are Fluid and Coordinated: No Coordination and Movement Description: gross motor movements impaired due to significant L hemiparesis with flaccid L UE and 0/5 to to 2-/5 muscle activation in L LE as well as impaired midline orientation Heel Shin Test: unable to perform with L LE due to paresis Motor  Motor Motor: Hemiplegia;Motor apraxia;Abnormal postural alignment and control;Abnormal tone;Motor perseverations Motor - Skilled Clinical Observations: L hemiplegia with flaccid UE and trace proximal muscle activaiton in LE  Mobility Bed Mobility Bed Mobility: Rolling Right;Rolling Left;Sit to Supine;Supine to Sit Rolling Right: Minimal Assistance - Patient > 75%;Moderate Assistance - Patient 50-74% Rolling Left: Minimal Assistance - Patient > 75% Supine to Sit: Moderate Assistance - Patient 50-74% Sit to Supine: Moderate Assistance - Patient 50-74% Transfers Transfers: Sit to Stand;Stand to Sit;Squat Pivot Transfers Sit to Stand: Maximal Assistance - Patient 25-49% Stand to Sit: Maximal Assistance - Patient 25-49% Squat Pivot Transfers: Maximal Assistance - Patient 25-49% Transfer (Assistive device): None Locomotion  Gait Ambulation: Yes Gait Assistance: 2 Helpers Gait Distance (Feet): 30 Feet Assistive device: Other (Comment)(hallway rail) Gait Assistance Details: Tactile cues for sequencing;Tactile cues for initiation;Tactile cues for weight shifting;Tactile cues for posture;Manual facilitation for placement;Manual facilitation for weight shifting;Manual facilitation for weight bearing;Verbal cues for gait pattern;Verbal cues for technique;Verbal cues for sequencing;Tactile cues for placement;Tactile cues for weight beaing Gait Assistance Details: max/total assist of 1 and +2 w/c follow Gait Gait: Yes Gait Pattern: Trunk rotated posteriorly on right;Decreased trunk rotation(required total assist for L LE management) Stairs / Additional  Locomotion Stairs: No(attempted but unable) Wheelchair Mobility Wheelchair Mobility: No  Trunk/Postural Assessment  Cervical Assessment Cervical Assessment: Exceptions to WFL(R rotation with R gaze preference, forward head) Thoracic Assessment Thoracic Assessment: Exceptions to WFL(rounded shoulders with slouched posture) Lumbar Assessment Lumbar Assessment: Exceptions to WFL(posterior pelvic tilt in sitting) Postural Control Postural Control: Deficits on evaluation Postural Limitations: decreased due to L hemiparesis requiring max assist for standing balance  Balance Balance Balance Assessed: Yes Static Sitting Balance Static Sitting - Balance Support: Feet supported;Right upper extremity supported Static Sitting - Level of Assistance: 4: Min assist Dynamic Sitting Balance Dynamic Sitting - Balance Support: During functional activity;Right upper extremity supported Dynamic Sitting - Level of Assistance: 3: Mod assist;2: Max Insurance risk surveyor Standing - Balance Support: Right upper extremity supported Static Standing - Level of Assistance: 3: Mod assist Dynamic Standing Balance Dynamic Standing - Balance Support: During functional activity;Right upper extremity supported Dynamic Standing - Level of Assistance: 2: Max  assist;1: +1 Total assist Extremity Assessment  RLE Assessment RLE Assessment: Exceptions to [facility] RLE Strength Right Hip Flexion: 1/5 Right Hip Extension: 1/5 Right Hip ABduction: 1/5 Right Hip ADduction: 1/5 Right Knee Flexion: 0/5 Right Knee Extension: 0/5 Right Ankle Dorsiflexion: 0/5 Right Ankle Plantar Flexion: 0/5 LLE Assessment LLE Assessment: Within Functional Limits    Refer to Care Plan for Long Term Goals  Recommendations for other services: None  at this time due to patient lethargy  Discharge Criteria: Patient will be discharged from PT if patient refuses treatment 3 consecutive times without medical reason, if treatment  goals not met, if there is a change in medical status, if patient makes no progress towards goals or if patient is discharged from hospital.  The above assessment, treatment plan, treatment alternatives and goals were discussed and mutually agreed upon: by patient  Tawana Scale, PT, DPT 12/02/2019, 12:59 PM

## 2019-12-02 NOTE — Evaluation (Signed)
Speech Language Pathology Assessment and Plan  Patient Details  Name: Imaan Padgett MRN: [id number] Date of Birth: [birth year]  SLP Diagnosis: Dysphagia;Cognitive Impairments  Rehab Potential: Good ELOS: 21-28 days    Today's Date: 12/02/2019 SLP Individual Time: 1500-1600 SLP Individual Time Calculation (min): 60 min   Problem List:  Patient Active Problem List   Diagnosis Date Noted  . Dysphagia, post-stroke   . Seizure prophylaxis   . Hemiparesis affecting left side as late effect of stroke ([place])   . SAH (subarachnoid hemorrhage) ([place])   . Tachypnea   . Tachycardia   . Hypertension   . Leukocytosis   . Cytotoxic brain edema ([place]) 11/20/2019  . Seizures ([place]) 11/20/2019  . Subarachnoid hemorrhage ([place])   . Endotracheally intubated   . Macrocytosis without anemia   . Encephalopathy acute   . Subarachnoid hematoma ([place]) 11/18/2019   Past Medical History: History reviewed. No pertinent past medical history. Past Surgical History:  Past Surgical History:  Procedure Laterality Date  . CRANIOTOMY Right 11/19/2019   Procedure: CRANIOTOMY CLIPPING OF CAROTID ANEURYSM;  Surgeon: Consuella Lose, MD;  Location: [facility];  Service: Neurosurgery;  Laterality: Right;    Assessment / Plan / Recommendation Clinical Impression   Analyn Matusek is a 64 year old right-handed female with unremarkable past medical history on no prescription medications.  History taken from chart review due to lethargy.  She presented on 11/18/2019 with seizures and headaches.  Cranial CT revealed subarachnoid hemorrhage.  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  No hydrocephalus.  Mass-effect with right to left shift of 3 mm.  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  The aneurysm was 2 to 3 mm proximal to the ICA  bifurcation.  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  Patient underwent right frontotemporal craniotomy for clipping of internal carotid artery aneurysm as well as evacuation of right temporal hematoma on 12/16/2019 by Dr. Kathyrn Sheriff.  She was continued on Keppra for seizure prophylaxis.  Subcutaneous Lovenox added for DVT prophylaxis 11/22/2019.  Hospital course complicated by dysphagia, and she was started on Dyshagia #1 thin liquids as well as nasogastric tube feeds for nutritional support..  Therapy evaluations completed and patient was admitted for a comprehensive rehab program.  Please see preadmission assessment from earlier today as well.  SLP evaluation was completed on 12/02/2019 with the following results:   Bedside swallow evaluation: Pt presents with a primarily oral dysphagia with both motor and cognitive components.  Pt has mild-moderate left sided oral motor deficits and an inattention to boluses which leads to an overall prolonged oral phase.  Pt had immediate coughing in approximately 20% of sips of thin liquids which SLP suspects to be related to a delay in swallow initiation resulting from decreased oral control of boluses.  Pt needed mod-max assist for use of universal swallowing precautions due to cognition.  I would recommend that pt remain on her currently prescribed diet with full supervision for use of swallowing precautions.    Cognitive-linguistic evaluation:  Pt presents with moderately severe cognitive deficits characterized by decreased sustained attention to tasks, decreased retrieval of information and decreased recall of new information, decreased functional problem solving, decreased intellectual awareness of deficits, and left inattention.  As a result,  pt currently requires max assist to complete basic tasks.    Given the abovementioned deficits, pt would benefit from skilled ST while inpatient in order to maximize functional  independence and reduce burden of care prior to discharge.  Anticipate that pt will need 24/7 supervision at discharge in addition to [place] follow up at next level of care.     Skilled Therapeutic Interventions          Cognitive-linguistic and bedside swallow evaluation completed with results and recommendations reviewed with patient     SLP Assessment  Patient will need skilled Speech Lanaguage Pathology Services during CIR admission    Recommendations  SLP Diet Recommendations: Dysphagia 1 (Puree);Thin Liquid Administration via: Cup;Straw Medication Administration: Crushed with puree Supervision: Staff to assist with self feeding;Full supervision/cueing for compensatory strategies Compensations: Minimize environmental distractions;Slow rate;Small sips/bites;Lingual sweep for clearance of pocketing Postural Changes and/or Swallow Maneuvers: Seated upright 90 degrees Oral Care Recommendations: Oral care BID Patient destination: Home Follow up Recommendations: Home Health SLP;24 hour supervision/assistance;Outpatient SLP Equipment Recommended: None recommended by SLP    SLP Frequency 3 to 5 out of 7 days   SLP Duration  SLP Intensity  SLP Treatment/Interventions 21-28 days  Minumum of 1-2 x/day, 30 to 90 minutes  Cognitive remediation/compensation;Cueing hierarchy;Functional tasks;Patient/family education;Internal/external aids;Dysphagia/aspiration precaution training;Environmental controls    Pain Pain Assessment Pain Scale: Faces Pain Score: 0-No pain Faces Pain Scale: Hurts a little bit Pain Location: Head Pain Descriptors / Indicators: Headache Pain Intervention(s): RN made aware  Prior Functioning Cognitive/Linguistic Baseline: Within functional limits Type of Home: House  Lives With: Family;Daughter Available Help at Discharge: Family;Available 24 hours/day Vocation: Full time employment  SLP Evaluation Cognition Overall Cognitive Status: Impaired/Different from  baseline Arousal/Alertness: Lethargic Orientation Level: Oriented to person;Oriented to place;Oriented to situation;Disoriented to time Attention: Sustained Focused Attention: Impaired Sustained Attention: Impaired Sustained Attention Impairment: Verbal basic;Functional basic Memory: Impaired Memory Impairment: Retrieval deficit;Decreased recall of new information Awareness: Impaired Awareness Impairment: Intellectual impairment Problem Solving: Impaired Problem Solving Impairment: Functional basic;Verbal basic Executive Function: (all impaired due to lower level deficits) Behaviors: Restless Safety/Judgment: Impaired Comments: left inattention  Comprehension Auditory Comprehension Overall Auditory Comprehension: Appears within functional limits for tasks assessed Expression Expression Primary Mode of Expression: Verbal Verbal Expression Overall Verbal Expression: Appears within functional limits for tasks assessed Oral Motor Oral Motor/Sensory Function Overall Oral Motor/Sensory Function: Mild impairment Facial ROM: Reduced left;Suspected CN VII (facial) dysfunction Facial Symmetry: Abnormal symmetry left;Suspected CN VII (facial) dysfunction Facial Strength: Reduced left;Suspected CN VII (facial) dysfunction Facial Sensation: Reduced left;Suspected CN V (Trigeminal) dysfunction Lingual ROM: Within Functional Limits Lingual Symmetry: Within Functional Limits Lingual Strength: Within Functional Limits Lingual Sensation: Within Functional Limits Motor Speech Overall Motor Speech: Appears within functional limits for tasks assessed   Bedside Swallowing Assessment General Previous Swallow Assessment: MBS 11/30/2019 Diet Prior to this Study: Dysphagia 1 (puree);Thin liquids Temperature Spikes Noted: No Respiratory Status: Room air History of Recent Intubation: Yes Length of Intubations (days): 1 days Date extubated: 11/20/19 Behavior/Cognition: Alert;Cooperative;Requires  cueing;Impulsive Oral Cavity - Dentition: Missing dentition Self-Feeding Abilities: Able to feed self;Needs set up Vision: Functional for self-feeding Patient Positioning: Upright in bed Baseline Vocal Quality: Normal  Oral Care Assessment   Ice Chips   Thin Liquid Thin Liquid: Impaired Pharyngeal  Phase Impairments: Cough - Immediate Nectar Thick   Honey Thick   Puree Puree: Impaired Oral Phase Functional Implications: Prolonged oral transit Solid   BSE Assessment Risk for Aspiration Impact on safety and function: Mild  aspiration risk;Moderate aspiration risk Other Related Risk Factors: Cognitive impairment;Lethargy  Short Term Goals: Week 1: SLP Short Term Goal 1 (Week 1): Pt will consume therapeutic trials of dys 2 textures with min cues for use of swallowing precautions and minimal overt s/s of aspiration over 3 consecutive sessions prior to advancement. SLP Short Term Goal 2 (Week 1): Pt will locate items at midline during functional tasks with mod assist verbal cues. SLP Short Term Goal 3 (Week 1): Pt will sustain her attention to basic, familiar tasks for 3 minutes with mod verbal cues for redirection. SLP Short Term Goal 4 (Week 1): Pt will complete basic, familiar tasks wtih mod assist for functional problem solving  Refer to Care Plan for Long Term Goals  Recommendations for other services: None   Discharge Criteria: Patient will be discharged from SLP if patient refuses treatment 3 consecutive times without medical reason, if treatment goals not met, if there is a change in medical status, if patient makes no progress towards goals or if patient is discharged from hospital.  The above assessment, treatment plan, treatment alternatives and goals were discussed and mutually agreed upon: by patient  Emilio Math 12/02/2019, 4:24 PM

## 2019-12-02 NOTE — Progress Notes (Signed)
RN and NT xfer pt to CIR.

## 2019-12-02 NOTE — Progress Notes (Signed)
  Brief Nutrition Note  Consult received for enteral/tube feeding initiation and management.  Adult Enteral Nutrition Protocol initiated. Full assessment to follow.  Admitting Dx: SAH (subarachnoid hemorrhage) (HCC) [I60.9]  Body mass index is 22.33 kg/m. Pt meets criteria for normal based on current BMI.   Recent Labs  Lab 12/02/19 0719  CREATININE 0.54   CBGs 112,101,113 x 24 hrs  Lars Masson, RD, LDN Clinical Nutrition [place] Telephone [phone] After Hours/Weekend Pager: [phone]

## 2019-12-02 NOTE — Evaluation (Signed)
Occupational Therapy Assessment and Plan  Patient Details  Name: Olivia Mann MRN: [id number] Date of Birth: [birth year]  OT Diagnosis: abnormal posture, acute pain, apraxia, cognitive deficits, disturbance of vision, flaccid hemiplegia and hemiparesis, lumbago (low back pain) and muscle weakness (generalized) Rehab Potential: Rehab Potential (ACUTE ONLY): Good ELOS: 21-23 days   Today's Date: 12/02/2019 OT Individual Time: 0347-4259 OT Individual Time Calculation (min): 60 min     Problem List:  Patient Active Problem List   Diagnosis Date Noted  . Dysphagia, post-stroke   . Seizure prophylaxis   . Hemiparesis affecting left side as late effect of stroke ([place])   . SAH (subarachnoid hemorrhage) ([place])   . Tachypnea   . Tachycardia   . Hypertension   . Leukocytosis   . Cytotoxic brain edema ([place]) 11/20/2019  . Seizures ([place]) 11/20/2019  . Subarachnoid hemorrhage ([place])   . Endotracheally intubated   . Macrocytosis without anemia   . Encephalopathy acute   . Subarachnoid hematoma ([place]) 11/18/2019    Past Medical History: History reviewed. No pertinent past medical history. Past Surgical History:  Past Surgical History:  Procedure Laterality Date  . CRANIOTOMY Right 11/19/2019   Procedure: CRANIOTOMY CLIPPING OF CAROTID ANEURYSM;  Surgeon: Consuella Lose, MD;  Location: [facility];  Service: Neurosurgery;  Laterality: Right;    Assessment & Plan Clinical Impression: Olivia Mann is a 64 year old right-handed female with unremarkable past medical history on no prescription medications.  History taken from chart review due to lethargy.  She presented on 11/18/2019 with seizures and headaches.  Cranial CT revealed subarachnoid hemorrhage.  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  No hydrocephalus.  Mass-effect with right to left shift of 3 mm.  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  The aneurysm was 2 to 3 mm proximal to the ICA bifurcation.  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  Patient underwent right frontotemporal craniotomy for clipping of internal carotid artery aneurysm as well as evacuation of right temporal hematoma on 12/16/2019 by Dr. Kathyrn Sheriff.  She was continued on Keppra for seizure prophylaxis.  Subcutaneous Lovenox added for DVT prophylaxis 11/22/2019.  Hospital course complicated by dysphagia, and she was started on Dyshagia #1 thin liquids as well as nasogastric tube feeds for nutritional support..  Therapy evaluations completed and patient was admitted for a comprehensive rehab program.  Please see preadmission assessment from earlier today as well.  Patient currently requires max with basic self-care skills secondary to muscle weakness, muscle joint tightness and muscle paralysis, decreased cardiorespiratoy endurance, abnormal tone, unbalanced muscle activation, decreased coordination and decreased motor planning, visual deficits, decreased attention to left, left side neglect and ideational apraxia, decreased initiation, decreased attention, decreased awareness, decreased problem solving, decreased safety awareness and decreased memory and decreased sitting balance, decreased standing balance, decreased postural control and hemiplegia.  Prior to hospitalization, patient could complete BADLs with independent .  Patient will benefit from skilled intervention to increase independence with basic self-care skills prior to discharge home with dtr in [place].  Anticipate patient will require 24 hour supervision and minimal physical assistance and follow up home health.  OT - End of Session Endurance Deficit: Yes(Increased truncal fatigue when sitting EOB for >20 minutes) OT Assessment Rehab Potential (  ACUTE ONLY): Good OT Barriers to  Discharge: Medical stability;Incontinence;Behavior;Home environment access/layout OT Barriers to Discharge Comments: 20 stairs to enter home OT Patient demonstrates impairments in the following area(s): Balance;Perception;Safety;Behavior;Cognition;Endurance;Vision;Motor;Nutrition;Pain OT Basic ADL's Functional Problem(s): Grooming;Bathing;Dressing;Toileting OT Advanced ADL's Functional Problem(s): Simple Meal Preparation OT Transfers Functional Problem(s): Toilet;Tub/Shower OT Additional Impairment(s): Fuctional Use of Upper Extremity OT Plan OT Intensity: Minimum of 1-2 x/day, 45 to 90 minutes OT Frequency: 5 out of 7 days OT Duration/Estimated Length of Stay: 21-23 days OT Treatment/Interventions: Balance/vestibular training;Discharge planning;Functional electrical stimulation;Pain management;UE/LE Coordination activities;Self Care/advanced ADL retraining;Therapeutic Activities;Visual/perceptual remediation/compensation;Therapeutic Exercise;Patient/family education;Functional mobility training;Disease mangement/prevention;Cognitive remediation/compensation;Community reintegration;DME/adaptive equipment instruction;Neuromuscular re-education;Psychosocial support;UE/LE Strength taining/ROM;Wheelchair propulsion/positioning;Splinting/orthotics OT Self Feeding Anticipated Outcome(s): No goal OT Basic Self-Care Anticipated Outcome(s): Min A OT Toileting Anticipated Outcome(s): Min A OT Bathroom Transfers Anticipated Outcome(s): CGA OT Recommendation Patient destination: Home Follow Up Recommendations: Home health OT Equipment Recommended: To be determined  Skilled Therapeutic Intervention Skilled OT session completed with focus on initial evaluation, education on OT role/POC, and establishment of patient-centered goals.   Pt greeted in bed, very lethargic with c/o back pain. Per RN, pt premedicated. Supine<sit completed with Min A. Once EOB, pts balance assist varied from CGA-Mod. Increased  assist when recovering LOB towards the Lt. Noted that pt often offloaded the Lt side in sitting. Strong Rt gaze preference with max vcs to turn head to scan towards Lt. Question visual impairments as when pt was cued to reach for certain items on her Rt side, she consistently selected incorrect ones. Max vcs and Max A to incorporate Lt hemibody during bathing/dressing tasks. Sit<stand with Mod A, Max A for dynamic balance while she assisted with perihygiene and LB dressing tasks. Total A for Teds and gripper socks. At end of session we practiced a stand pivot<3:1. Max A for going in both Rt>Lt directions due to decreased awareness of Lt hemibody placement. Note that pt is also impulsive with functional movement as well. At end of session pt remained in bed with 4 bedrails up, bed alarm set, and call bell on her Rt side.   OT Evaluation Precautions/Restrictions  Precautions Precautions: Fall Precaution Comments: L neglect, L hemiparesis Restrictions Weight Bearing Restrictions: No General Chart Reviewed: Yes Family/Caregiver Present: No Pain Pain Assessment Pain Scale: 0-10 Pain Score: 9  Pain Location: Head Pain Frequency: Constant Pain Onset: On-going Patients Stated Pain Goal: 0 Pain Intervention(s): Medication (See eMAR) Home Living/Prior [place] expects to be discharged to:: Private residence Available Help at Discharge: Family, Available 24 hours/day Type of Home: House Home Access: Stairs to enter [employer] of Steps: 20 STE per rehab admission documentation Home Layout: Two level, Able to live on main level with bedroom/bathroom Bathroom Shower/Tub: Multimedia programmer: Standard Bathroom Accessibility: Yes  Lives With: Family, Daughter(lives with dtr Warehouse manager in [place]) IADL History Occupation: Part time employment 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 Leisure and Hobbies: Arboriculturist music Prior Function Level of  Independence: Independent with basic ADLs Driving: Yes ADL ADL Eating: Not assessed Grooming: Maximal assistance Where Assessed-Grooming: Edge of bed Upper Body Bathing: Moderate assistance Where Assessed-Upper Body Bathing: Edge of bed Lower Body Bathing: Maximal assistance Where Assessed-Lower Body Bathing: Edge of bed Upper Body Dressing: Maximal assistance Where Assessed-Upper Body Dressing: Edge of bed Lower Body Dressing: Maximal assistance Where Assessed-Lower Body Dressing: Edge of bed Toileting: Not assessed Toilet Transfer: Maximal assistance Toilet Transfer Method: Arts development officer: Engineer, technical sales Transfer: Not assessed Vision Patient  Visual Report: No change from baseline Vision Assessment?: Vision impaired- to be further tested in functional context Alignment/Gaze Preference: Gaze right Perception  Perception: Impaired Inattention/Neglect: Does not attend to left visual field;Does not attend to left side of body Praxis Praxis: Impaired Praxis Impairment Details: Initiation;Motor planning;Perseveration Cognition Arousal/Alertness: Lethargic Orientation Level: Person;Place;Situation Person: Oriented Place: Oriented Situation: Oriented Year: 2021 Month: January Day of Week: Incorrect Memory: Impaired Immediate Memory Recall: Sock;Bed Memory Recall Sock: Not able to recall Memory Recall Blue: Not able to recall Memory Recall Bed: Not able to recall Sustained Attention: Impaired Behaviors: Impulsive;Restless Safety/Judgment: Impaired(Pt bending over to floor impulsively several times during session) Sensation Coordination Gross Motor Movements are Fluid and Coordinated: No Fine Motor Movements are Fluid and Coordinated: No Finger Nose Finger Test: Per pt: "Where is your finger?" When OT's finger shifted towards her Rt visual field, pt reaching out for knuckle or back of hand. Unable to complete on the Lt side due to  flaccidity Motor  Motor Motor: Hemiplegia;Motor apraxia;Abnormal postural alignment and control;Abnormal tone Motor - Skilled Clinical Observations: Lt hemiplegia with flaccid UE Mobility    Max A stand pivot 3:1 transfer  Trunk/Postural Assessment  Cervical Assessment Cervical Assessment: Exceptions to WFL(very forward flexed, Rt lateral rotation) Thoracic Assessment Thoracic Assessment: Exceptions to WFL(rounded shoulders) Postural Control Postural Control: (frequent LOBs during self care tasks EOB, offloads Lt side in sitting, Lt lean in standing)  Balance Balance Balance Assessed: Yes Dynamic Sitting Balance Sitting balance - Comments: Dynamic sitting: Mod A when threading R LE into pants. Dynamic standing: Max A when pulling pants over hips on the Rt side Extremity/Trunk Assessment RUE Assessment RUE Assessment: Within Functional Limits LUE Assessment LUE Assessment: Exceptions to [facility] Passive Range of Motion (PROM) Comments: Pain in underarm with shoulder flexion to 90 degrees, no pain in 90 degrees shoulder abduction General Strength Comments: Flaccid, flexor synergies in shoulder and hand     Refer to Care Plan for Long Term Goals  Recommendations for other services: None    Discharge Criteria: Patient will be discharged from OT if patient refuses treatment 3 consecutive times without medical reason, if treatment goals not met, if there is a change in medical status, if patient makes no progress towards goals or if patient is discharged from hospital.  The above assessment, treatment plan, treatment alternatives and goals were discussed and mutually agreed upon: No family available/patient unable  Skeet Simmer 12/02/2019, 12:35 PM

## 2019-12-03 DIAGNOSIS — I69354 Hemiplegia and hemiparesis following cerebral infarction affecting left non-dominant side: Secondary | ICD-10-CM

## 2019-12-03 DIAGNOSIS — I69391 Dysphagia following cerebral infarction: Secondary | ICD-10-CM

## 2019-12-03 DIAGNOSIS — I10 Essential (primary) hypertension: Secondary | ICD-10-CM

## 2019-12-03 DIAGNOSIS — E876 Hypokalemia: Secondary | ICD-10-CM

## 2019-12-03 LAB — GLUCOSE, CAPILLARY
Glucose-Capillary: 100 mg/dL — ABNORMAL HIGH (ref 70–99)
Glucose-Capillary: 103 mg/dL — ABNORMAL HIGH (ref 70–99)
Glucose-Capillary: 107 mg/dL — ABNORMAL HIGH (ref 70–99)
Glucose-Capillary: 109 mg/dL — ABNORMAL HIGH (ref 70–99)
Glucose-Capillary: 92 mg/dL (ref 70–99)

## 2019-12-03 NOTE — Progress Notes (Signed)
   12/03/19 1315  What Happened  Was fall witnessed? Yes  Who witnessed fall? Wandra Feinstein  Patients activity before fall to/from bed, chair, or stretcher  Point of contact buttocks  Was patient injured? No  Patient found on floor  Found by Staff-comment  Stated prior activity other (comment)  Follow Up  MD notified Dr. Allena Katz  Time MD notified [phone]  Family notified Yes - comment  Time family notified 1315  Additional tests No  Simple treatment Other (comment) (none needed)  Progress note created (see row info) Yes  Adult Fall Risk Assessment  Risk Factor Category (scoring not indicated) Fall has occurred during this admission (document High fall risk)  Age 64  Fall History: Fall within 6 months prior to admission 0  Elimination; Bowel and/or Urine Incontinence 2  Elimination; Bowel and/or Urine Urgency/Frequency 2  Medications: includes PCA/Opiates, Anti-convulsants, Anti-hypertensives, Diuretics, Hypnotics, Laxatives, Sedatives, and Psychotropics 5  Patient Care Equipment 2  Mobility-Assistance 2  Mobility-Gait 2  Mobility-Sensory Deficit 2  Altered awareness of immediate physical environment 1  Impulsiveness 2  Lack of understanding of one's physical/cognitive limitations 4  Total Score 25  Patient Fall Risk Level High fall risk  Adult Fall Risk Interventions  Required Bundle Interventions *See Row Information* High fall risk - low, moderate, and high requirements implemented  Additional Interventions Camera surveillance (with patient/family notification & education);Lap belt while in chair/wheelchair;Use of appropriate toileting equipment (bedpan, BSC, etc.)  Screening for Fall Injury Risk (To be completed on HIGH fall risk patients) - Assessing Need for Low Bed  Risk For Fall Injury- Low Bed Criteria TeleSitter Camera in use  Will Implement Low Bed and Floor Mats Yes  Screening for Fall Injury Risk (To be completed on HIGH fall risk patients who do not meet crieteria  for Low Bed) - Assessing Need for Floor Mats Only  Risk For Fall Injury- Criteria for Floor Mats None identified - No additional interventions needed  Will Implement Floor Mats Yes  Pain Assessment  Pain Scale 0-10  Pain Score 0  Neurological  Neuro (WDL) X  Level of Consciousness Alert  Orientation Level Oriented to person;Oriented to place  Cognition Impulsive;Poor attention/concentration;Poor safety awareness;Poor judgement  Speech Clear  Motor Function/Sensation Assessment Grip  R Hand Grip Moderate  L Hand Grip Absent   R Foot Dorsiflexion Moderate  L Foot Dorsiflexion Absent  R Foot Plantar Flexion Moderate  L Foot Plantar Flexion Absent  RUE Motor Response Purposeful movement  RUE Sensation Full sensation  RUE Motor Strength 5  LUE Motor Response Non-purposeful movement  LUE Sensation Decreased  LUE Motor Strength 1  RLE Motor Response Purposeful movement  RLE Sensation Full sensation  RLE Motor Strength 5  LLE Motor Response Non-purposeful movement  LLE Sensation Decreased  LLE Motor Strength 1  Neuro Symptoms Anxiety  Neuro symptoms relieved by Anti-anxiety medication  Musculoskeletal  Musculoskeletal (WDL) X  Assistive Device BSC;Wheelchair  Generalized Weakness Yes  Weight Bearing Restrictions No  Musculoskeletal Details  LUE Paralysis  LLE Paralysis  Integumentary  Integumentary (WDL) X  Skin Color Appropriate for ethnicity  Skin Condition Dry  Skin Integrity Surgical Incision (see LDA)  Skin Tear Location Arm  Skin Tear Location Orientation Left  Skin Tear Intervention Other (Comment) (Assessed)  Skin Turgor Non-tenting

## 2019-12-03 NOTE — Progress Notes (Signed)
[facility] PHYSICAL MEDICINE & REHABILITATION PROGRESS NOTE  Subjective/Complaints: Patient seen laying in bed this morning. She states she slept well overnight. She states she had good 1st day of therapies yesterday. She is looking for her daughter. Later called by nursing and informed of fall, sitting on the floor at the side of the bed.  ROS: Unreliable due to cognition/language  Objective: Vital Signs: Blood pressure 137/88, pulse (!) 107, temperature 99.1 F (37.3 C), resp. rate 18, height 5\' 1"  (1.549 m), weight 54.9 kg, SpO2 100 %. No results found. Recent Labs    12/02/19 0719  WBC 9.1  HGB 12.5  HCT 37.2  PLT 539*   Recent Labs    12/02/19 0719  CREATININE 0.54    Physical Exam: BP 137/88   Pulse (!) 107   Temp 99.1 F (37.3 C)   Resp 18   Ht 5\' 1"  (1.549 m)   Wt 54.9 kg   SpO2 100%   BMI 22.87 kg/m  Constitutional: No distress . Vital signs reviewed. HENT: Craniotomy site. + NG. Eyes: Right gaze preference. No discharge. Cardiovascular: No JVD. Respiratory: Normal effort.  No stridor. GI: Non-distended. Skin: Warm and dry.  Intact. Psych: Unable to assess due to mentation Musc: No edema in extremities.  No tenderness in extremities. Neurological: Alert and oriented to person and hospital only Right gaze preference Limited awareness of deficits Motor: Not following commands, moving right side freely, no movement noted on left side.  Assessment/Plan: 1. Functional deficits secondary to subarachnoid hemorrhage/hematoma status post right frontotemporal craniotomy which require 3+ hours per day of interdisciplinary therapy in a comprehensive inpatient rehab setting.  Physiatrist is providing close team supervision and 24 hour management of active medical problems listed below.  Physiatrist and rehab team continue to assess barriers to discharge/monitor patient progress toward functional and medical goals  Care Tool:  Bathing    Body parts bathed by  patient: Chest, Abdomen, Front perineal area, Right upper leg, Left upper leg, Face   Body parts bathed by helper: Left arm, Right arm, Buttocks, Right lower leg, Left lower leg     Bathing assist Assist Level: Maximal Assistance - Patient 24 - 49%     Upper Body Dressing/Undressing Upper body dressing   What is the patient wearing?: Bra    Upper body assist Assist Level: Maximal Assistance - Patient 25 - 49%    Lower Body Dressing/Undressing Lower body dressing      What is the patient wearing?: Pants, Incontinence brief     Lower body assist Assist for lower body dressing: Maximal Assistance - Patient 25 - 49%     Toileting Toileting    Toileting assist Assist for toileting: Maximal Assistance - Patient 25 - 49%     Transfers Chair/bed transfer  Transfers assist     Chair/bed transfer assist level: Maximal Assistance - Patient 25 - 49%     Locomotion Ambulation   Ambulation assist      Assist level: 2 helpers Assistive device: Other (comment)(hallway rail) Max distance: 22ft   Walk 10 feet activity   Assist     Assist level: 2 helpers Assistive device: Other (comment)(hallway rail)   Walk 50 feet activity   Assist Walk 50 feet with 2 turns activity did not occur: Safety/medical concerns         Walk 150 feet activity   Assist Walk 150 feet activity did not occur: Safety/medical concerns         Walk 10  feet on uneven surface  activity   Assist Walk 10 feet on uneven surfaces activity did not occur: Safety/medical concerns         Wheelchair     Assist Will patient use wheelchair at discharge?: (TBD)             Wheelchair 50 feet with 2 turns activity    Assist            Wheelchair 150 feet activity     Assist            1.  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  Status post right frontotemporal craniotomy for clipping of internal carotid artery aneurysm as  well as evacuation of right temporal hematoma 11/19/2019  Continue CIR  2.  Antithrombotics: -DVT/anticoagulation: Subcutaneous Lovenox initiated 11/22/2019.  Monitor for any bleeding episodes  CBC ordered for tomorrow             -antiplatelet therapy: N/A 3. Pain Management: Oxycodone as needed 4. Mood: Xanax 1 mg every 6 hours as needed             -antipsychotic agents: N/A 5. Neuropsych: This patient is not capable of making decisions on her own behalf. 6. Skin/Wound Care: Routine skin checks 7. Fluids/Electrolytes/Nutrition: Routine in and outs.   CMP ordered for tomorrow 8. Seizure prophylaxis.   Continue Keppra 1000 mg twice daily 9.  Post stroke dysphagia: Dysphagia #1 thins liquids.  Nasogastric tube feeds.  Follow-up speech therapy 10. Hypertension.  Completing course of [place].                Controlled on 1/17  Monitor with increased mobility 11. Hypokalemia  Potassium 2.8 on 1/7  Labs ordered for tomorrow  LOS: 1 days A FACE TO FACE EVALUATION WAS PERFORMED  Darleth Eustache Karis Juba 12/03/2019, 10:20 PM

## 2019-12-04 ENCOUNTER — Inpatient Hospital Stay (HOSPITAL_COMMUNITY): Payer: Self-pay

## 2019-12-04 ENCOUNTER — Inpatient Hospital Stay (HOSPITAL_COMMUNITY): Payer: Self-pay | Admitting: Speech Pathology

## 2019-12-04 ENCOUNTER — Inpatient Hospital Stay (HOSPITAL_COMMUNITY): Payer: Self-pay | Admitting: Physical Therapy

## 2019-12-04 DIAGNOSIS — R1312 Dysphagia, oropharyngeal phase: Secondary | ICD-10-CM

## 2019-12-04 DIAGNOSIS — I609 Nontraumatic subarachnoid hemorrhage, unspecified: Secondary | ICD-10-CM

## 2019-12-04 DIAGNOSIS — E876 Hypokalemia: Secondary | ICD-10-CM

## 2019-12-04 LAB — CBC WITH DIFFERENTIAL/PLATELET
Abs Immature Granulocytes: 0.02 10*3/uL (ref 0.00–0.07)
Basophils Absolute: 0 10*3/uL (ref 0.0–0.1)
Basophils Relative: 1 %
Eosinophils Absolute: 0.1 10*3/uL (ref 0.0–0.5)
Eosinophils Relative: 1 %
HCT: 35.3 % — ABNORMAL LOW (ref 36.0–46.0)
Hemoglobin: 11.9 g/dL — ABNORMAL LOW (ref 12.0–15.0)
Immature Granulocytes: 0 %
Lymphocytes Relative: 25 %
Lymphs Abs: 1.9 10*3/uL (ref 0.7–4.0)
MCH: 34.3 pg — ABNORMAL HIGH (ref 26.0–34.0)
MCHC: 33.7 g/dL (ref 30.0–36.0)
MCV: 101.7 fL — ABNORMAL HIGH (ref 80.0–100.0)
Monocytes Absolute: 1 10*3/uL (ref 0.1–1.0)
Monocytes Relative: 13 %
Neutro Abs: 4.7 10*3/uL (ref 1.7–7.7)
Neutrophils Relative %: 60 %
Platelets: 502 10*3/uL — ABNORMAL HIGH (ref 150–400)
RBC: 3.47 MIL/uL — ABNORMAL LOW (ref 3.87–5.11)
RDW: 13.6 % (ref 11.5–15.5)
WBC: 7.7 10*3/uL (ref 4.0–10.5)
nRBC: 0 % (ref 0.0–0.2)

## 2019-12-04 LAB — GLUCOSE, CAPILLARY
Glucose-Capillary: 113 mg/dL — ABNORMAL HIGH (ref 70–99)
Glucose-Capillary: 127 mg/dL — ABNORMAL HIGH (ref 70–99)
Glucose-Capillary: 128 mg/dL — ABNORMAL HIGH (ref 70–99)
Glucose-Capillary: 134 mg/dL — ABNORMAL HIGH (ref 70–99)
Glucose-Capillary: 73 mg/dL (ref 70–99)
Glucose-Capillary: 84 mg/dL (ref 70–99)
Glucose-Capillary: 84 mg/dL (ref 70–99)
Glucose-Capillary: 97 mg/dL (ref 70–99)

## 2019-12-04 LAB — COMPREHENSIVE METABOLIC PANEL
ALT: 99 U/L — ABNORMAL HIGH (ref 0–44)
AST: 46 U/L — ABNORMAL HIGH (ref 15–41)
Albumin: 2.9 g/dL — ABNORMAL LOW (ref 3.5–5.0)
Alkaline Phosphatase: 159 U/L — ABNORMAL HIGH (ref 38–126)
Anion gap: 11 (ref 5–15)
BUN: 7 mg/dL — ABNORMAL LOW (ref 8–23)
CO2: 24 mmol/L (ref 22–32)
Calcium: 9.4 mg/dL (ref 8.9–10.3)
Chloride: 99 mmol/L (ref 98–111)
Creatinine, Ser: 0.5 mg/dL (ref 0.44–1.00)
GFR calc Af Amer: >60 mL/min (ref >60)
GFR calc non Af Amer: >60 mL/min (ref >60)
Glucose, Bld: 102 mg/dL — ABNORMAL HIGH (ref 70–99)
Potassium: 4.4 mmol/L (ref 3.5–5.1)
Sodium: 134 mmol/L — ABNORMAL LOW (ref 135–145)
Total Bilirubin: 0.3 mg/dL (ref 0.3–1.2)
Total Protein: 6.9 g/dL (ref 6.5–8.1)

## 2019-12-04 MED ORDER — JEVITY 1.2 CAL PO LIQD
900.0000 mL | ORAL | Status: DC
  Administered 2019-12-04: 900 mL
  Filled 2019-12-04: qty 1000
  Filled 2019-12-04: qty 948

## 2019-12-04 MED ORDER — PRO-STAT SUGAR FREE PO LIQD
30.0000 mL | Freq: Two times a day (BID) | ORAL | Status: DC
  Administered 2019-12-04 – 2019-12-12 (×16): 30 mL
  Filled 2019-12-04 (×16): qty 30

## 2019-12-04 MED ORDER — ENSURE ENLIVE PO LIQD
237.0000 mL | Freq: Three times a day (TID) | ORAL | Status: DC
  Administered 2019-12-04 – 2019-12-21 (×41): 237 mL via ORAL

## 2019-12-04 NOTE — Plan of Care (Signed)
  Problem: Consults Goal: RH GENERAL PATIENT EDUCATION Description: See Patient Education module for education specifics. Outcome: Progressing Goal: Skin Care Protocol Initiated - if Braden Score 18 or less Description: If consults are not indicated, leave blank or document N/A Outcome: Progressing Goal: Nutrition Consult-if indicated Outcome: Progressing   Problem: RH BOWEL ELIMINATION Goal: RH STG MANAGE BOWEL WITH ASSISTANCE Description: STG Manage Bowel with Assistance. Outcome: Progressing   Problem: RH BLADDER ELIMINATION Goal: RH STG MANAGE BLADDER WITH ASSISTANCE Description: STG Manage Bladder With Assistance Outcome: Progressing   Problem: RH SKIN INTEGRITY Goal: RH STG SKIN FREE OF INFECTION/BREAKDOWN Outcome: Progressing   Problem: RH SAFETY Goal: RH STG ADHERE TO SAFETY PRECAUTIONS W/ASSISTANCE/DEVICE Description: STG Adhere to Safety Precautions With Assistance/Device. Outcome: Progressing Goal: RH STG DECREASED RISK OF FALL WITH ASSISTANCE Description: STG Decreased Risk of Fall With Assistance. Outcome: Progressing   Problem: RH PAIN MANAGEMENT Goal: RH STG PAIN MANAGED AT OR BELOW PT'S PAIN GOAL Outcome: Progressing   Problem: RH KNOWLEDGE DEFICIT GENERAL Goal: RH STG INCREASE KNOWLEDGE OF SELF CARE AFTER HOSPITALIZATION Outcome: Progressing   

## 2019-12-04 NOTE — Progress Notes (Signed)
Speech Language Pathology Daily Session Note  Patient Details  Name: Olivia Mann MRN: [id number] Date of Birth: 09/16/1956  Today's Date: 12/04/2019 SLP Individual Time: 0930-1010 SLP Individual Time Calculation (min): 40 min  Short Term Goals: Week 1: SLP Short Term Goal 1 (Week 1): Pt will consume therapeutic trials of dys 2 textures with min cues for use of swallowing precautions and minimal overt s/s of aspiration over 3 consecutive sessions prior to advancement. SLP Short Term Goal 2 (Week 1): Pt will locate items at midline during functional tasks with mod assist verbal cues. SLP Short Term Goal 3 (Week 1): Pt will sustain her attention to basic, familiar tasks for 3 minutes with mod verbal cues for redirection. SLP Short Term Goal 4 (Week 1): Pt will complete basic, familiar tasks wtih mod assist for functional problem solving  Skilled Therapeutic Interventions: Skilled treatment session focused on dysphagia and cognitive goals. SLP facilitated session by providing total A for intellectual awareness of deficits as patient preservative on the idea that she is ready to go home. Patient required Max A multimodal cues to maintain her head in an upright position which was further exacerbated by hair (braids falling in her face despite constant repositioning). Patient required Max A multimodal cues for sustained attention for ~30-60 second intervals and for scanning to midline. Total A was required to complete basic self care tasks like oral care via the suction toothbrush due to visual and problem solving deficits. Patient attempted to consume liquid pain medication via straw resulting in severe anterior spillage, however, anterior spillage was reduced with head in a more upright position.  Mild overt s/s of aspiration noted, suspect due to decreased oral control. Patient left upright at RN station with alarm on. Continue with current plan of care.      Pain Pain Assessment Pain Scale:  0-10 Pain Score: 10  Pain Type: Surgical pain Pain Location: Head Pain Orientation: Right;Anterior Pain Descriptors / Indicators: Headache Pain Frequency: Constant Pain Onset: On-going Patients Stated Pain Goal: 2 Pain Intervention(s): Repositioned  Therapy/Group: Individual Therapy  Dema Timmons 12/04/2019, 10:33 AM

## 2019-12-04 NOTE — IPOC Note (Signed)
Overall Plan of Care [facility]) Patient Details Name: Olivia Mann MRN: [id number] DOB: 1956/01/25  Admitting Diagnosis: <principal problem not specified>  Hospital Problems: Active Problems:   SAH (subarachnoid hemorrhage) ([place])   Hypokalemia     Functional Problem List: Nursing Behavior, Bladder, Bowel, Medication Management, Nutrition, Pain, Perception, Safety, Sensory  PT Balance, Perception, Behavior, Safety, Edema, Sensory, Endurance, Skin Integrity, Motor, Nutrition, Pain  OT Balance, Perception, Safety, Behavior, Cognition, Endurance, Vision, Motor, Nutrition, Pain  SLP Cognition, Nutrition  TR         Basic ADL's: OT Grooming, Bathing, Dressing, Toileting     Advanced  ADL's: OT Simple Meal Preparation     Transfers: PT Bed Mobility, Bed to Chair, Car, Manufacturing systems engineer, Metallurgist: PT Ambulation, Emergency planning/management officer, Stairs     Additional Impairments: OT Fuctional Use of Upper Extremity  SLP Swallowing, Social Cognition   Problem Solving, Memory, Attention, Awareness  TR      Anticipated Outcomes Item Anticipated Outcome  Self Feeding No goal  Swallowing  Supervision   Basic self-care  Min A  Toileting  Min A   Bathroom Transfers CGA  Bowel/Bladder  manage bowel and bladder with mod assist  Transfers  CGA  Locomotion  CGA  Communication     Cognition  Min assist  Pain  manage pain with mod assist  Safety/Judgment  have no falls during adm, adhere to safety plan   Therapy Plan: PT Intensity: Minimum of 1-2 x/day ,45 to 90 minutes PT Frequency: 5 out of 7 days PT Duration Estimated Length of Stay: ~3.5 weeks OT Intensity: Minimum of 1-2 x/day, 45 to 90 minutes OT Frequency: 5 out of 7 days OT Duration/Estimated Length of Stay: 21-23 days SLP Intensity: Minumum of 1-2 x/day, 30 to 90 minutes SLP Frequency: 3 to 5 out of 7 days SLP Duration/Estimated Length of Stay: 21-28 days   Due to the current state of emergency,  patients may not be receiving their 3-hours of Medicare-mandated therapy.   Team Interventions: Nursing Interventions Patient/Family Education, Bladder Management, Bowel Management, Pain Management, Medication Management, Skin Care/Wound Management, Cognitive Remediation/Compensation, Dysphagia/Aspiration Precaution Training  PT interventions Ambulation/gait training, Community reintegration, DME/adaptive equipment instruction, Neuromuscular re-education, Psychosocial support, Stair training, UE/LE Strength taining/ROM, Wheelchair propulsion/positioning, Training and development officer, Discharge planning, Functional electrical stimulation, Pain management, Skin care/wound management, Therapeutic Activities, UE/LE Coordination activities, Cognitive remediation/compensation, Functional mobility training, Disease management/prevention, Patient/family education, Splinting/orthotics, Therapeutic Exercise, Visual/perceptual remediation/compensation  OT Interventions Balance/vestibular training, Discharge planning, Functional electrical stimulation, Pain management, UE/LE Coordination activities, Self Care/advanced ADL retraining, Therapeutic Activities, Visual/perceptual remediation/compensation, Therapeutic Exercise, Patient/family education, Functional mobility training, Disease mangement/prevention, Cognitive remediation/compensation, Academic librarian, Engineer, drilling, Neuromuscular re-education, Psychosocial support, UE/LE Strength taining/ROM, Wheelchair propulsion/positioning, Splinting/orthotics  SLP Interventions Cognitive remediation/compensation, English as a second language teacher, Functional tasks, Patient/family education, Internal/external aids, Dysphagia/aspiration precaution training, Environmental controls  TR Interventions    SW/CM Interventions Discharge Planning, Psychosocial Support, Patient/Family Education   Barriers to Discharge MD  Medical stability  Nursing Home environment  access/layout, Incontinence, Wound Care, Medication compliance, Behavior, Nutrition means    PT Inaccessible home environment, Home environment access/layout, Nutrition means    OT Medical stability, Incontinence, Behavior, Home environment access/layout 20 stairs to enter home  SLP      SW       Team Discharge Planning: Destination: PT-Home ,OT- Home , SLP-Home Projected Follow-up: PT-Home health PT, 24 hour supervision/assistance, OT-  Home health OT, SLP-Home Health SLP, 24 hour supervision/assistance, Outpatient SLP Projected  Equipment Needs: PT-To be determined, OT- To be determined, SLP-None recommended by SLP Equipment Details: PT- , OT-  Patient/family involved in discharge planning: PT- Patient, Family member/caregiver,  OT-Patient unable/family or caregiver not available, SLP-Patient unable/family or caregive not available  MD ELOS: 24-28 days Medical Rehab Prognosis:  Excellent Assessment: The patient has been admitted for CIR therapies with the diagnosis of right SAH. The team will be addressing functional mobility, strength, stamina, balance, safety, adaptive techniques and equipment, self-care, bowel and bladder mgt, patient and caregiver education, NMR, communication, cognition, pain control, swallowing, community reentry. Goals have been set at min assist with self-care, contact guard assist to supervision with mobility and contact guard to supervision with cognition, communication.   Due to the current state of emergency, patients may not be receiving their 3 hours per day of Medicare-mandated therapy.    Ranelle Oyster, MD, FAAPMR      See Team Conference Notes for weekly updates to the plan of care

## 2019-12-04 NOTE — Progress Notes (Signed)
Occupational Therapy Session Note  Patient Details  Name: Olivia Mann MRN: [id number] Date of Birth: 20-Aug-1956  Today's Date: 12/04/2019 OT Individual Time: 9323-5573 OT Individual Time Calculation (min): 75 min    Short Term Goals: Week 1:  OT Short Term Goal 1 (Week 1): Pt will complete BSC transfer with Mod A OT Short Term Goal 2 (Week 1): Pt will complete LB self care with Mod A for dynamic standing balance OT Short Term Goal 3 (Week 1): Pt will utilize head turns towards the Lt to locate 2 items needed during ADL session with mod cuing  Skilled Therapeutic Interventions/Progress Updates:    Pt received supine in bed, very lethargic but conversational with OT when engaged, reporting no pain. Pt oriented to place and time this session. Increased time throughout for initiation and mod-max cueing overall. Pt came to EOB with min A after several minutes of initiation cues. Pt with increased difficulty keeping head up and with lean R and L while sitting EOB. Pt completed stand pivot transfer to the TIS w/c with mod A toward the R side. Pt required 5x attempts to scoot back in the chair, sitting dangerously close to the edge each stand > sit. No active movement observed or palpated in pt's LUE this session. Pt required min A to complete oral hygiene, taking over once OT initiated. Pt stood at the sink with mod A for assistance with changing brief and peri hygiene. Pt required +2 max A for brief change in standing. Pt requested to eat breakfast. Pt completed self feeding with min A overall, poor adherence to single sip without max cueing. SLP informed of pt coughing throughout. Ended PO intake 2/2 coughing, pt consuming 15% of meal. Pt was left sitting up in the TIS w/c at the nurses desk for increased (S) to ensure safety. Cortak and IV fluids intact.   Therapy Documentation Precautions:  Precautions Precautions: Fall Precaution Comments: L neglect, L hemiparesis Restrictions Weight Bearing  Restrictions: No   Therapy/Group: Individual Therapy  [name] 12/04/2019, 7:01 AM

## 2019-12-04 NOTE — Progress Notes (Signed)
Patient information reviewed and entered into [facility] System by Becky Ireland Virrueta, PPS coordinator. Information including medical coding, function ability, and quality indicators will be reviewed and updated through discharge.   

## 2019-12-04 NOTE — Plan of Care (Signed)
  Problem: Consults Goal: RH GENERAL PATIENT EDUCATION Description: See Patient Education module for education specifics. Outcome: Progressing   Problem: RH BOWEL ELIMINATION Goal: RH STG MANAGE BOWEL WITH ASSISTANCE Description: STG Manage Bowel with Assistance. Outcome: Progressing   Problem: RH BLADDER ELIMINATION Goal: RH STG MANAGE BLADDER WITH ASSISTANCE Description: STG Manage Bladder With Assistance Outcome: Progressing   Problem: RH SKIN INTEGRITY Goal: RH STG SKIN FREE OF INFECTION/BREAKDOWN Outcome: Progressing   Problem: RH PAIN MANAGEMENT Goal: RH STG PAIN MANAGED AT OR BELOW PT'S PAIN GOAL Outcome: Progressing   

## 2019-12-04 NOTE — Progress Notes (Addendum)
[facility] PHYSICAL MEDICINE & REHABILITATION PROGRESS NOTE  Subjective/Complaints: Pt up at nurse station. Has mild headache. Wants her hot tea!   ROS: Limited due to cognitive/behavioral     Objective: Vital Signs: Blood pressure 120/79, pulse 92, temperature 98.6 F (37 C), temperature source Oral, resp. rate 16, height 5\' 1"  (1.549 m), weight 57.2 kg, SpO2 99 %. No results found. Recent Labs    12/02/19 0719 12/04/19 0535  WBC 9.1 7.7  HGB 12.5 11.9*  HCT 37.2 35.3*  PLT 539* 502*   Recent Labs    12/02/19 0719 12/04/19 0535  NA  --  134*  K  --  4.4  CL  --  99  CO2  --  24  GLUCOSE  --  102*  BUN  --  7*  CREATININE 0.54 0.50  CALCIUM  --  9.4    Physical Exam: BP 120/79 (BP Location: Left Arm)   Pulse 92   Temp 98.6 F (37 C) (Oral)   Resp 16   Ht 5\' 1"  (1.549 m)   Wt 57.2 kg   SpO2 99%   BMI 23.83 kg/m  Constitutional: No distress . Vital signs reviewed. HEENT: EOMI, oral membranes moist, NGT in place Neck: supple Cardiovascular: RRR without murmur. No JVD    Respiratory: CTA Bilaterally without wheezes or rales. Normal effort    GI: BS +, non-tender, non-distended  Skin: staples along right scalp/forehead Psych: distracted, restless.  Musc: No edema in extremities.  No tenderness in extremities. Neurological: Alert and oriented to person and hospital, follows simple commands. No spontaneous movement of left except with tactile cueing Right gaze preference, makes little eye contact Limited awareness of deficits Decreased pain sense left arm and leg  Assessment/Plan: 1. Functional deficits secondary to subarachnoid hemorrhage/hematoma status post right frontotemporal craniotomy which require 3+ hours per day of interdisciplinary therapy in a comprehensive inpatient rehab setting.  Physiatrist is providing close team supervision and 24 hour management of active medical problems listed below.  Physiatrist and rehab team continue to assess  barriers to discharge/monitor patient progress toward functional and medical goals  Care Tool:  Bathing    Body parts bathed by patient: Chest, Abdomen, Front perineal area, Right upper leg, Left upper leg, Face   Body parts bathed by helper: Left arm, Right arm, Buttocks, Right lower leg, Left lower leg     Bathing assist Assist Level: Maximal Assistance - Patient 24 - 49%     Upper Body Dressing/Undressing Upper body dressing   What is the patient wearing?: Bra    Upper body assist Assist Level: Maximal Assistance - Patient 25 - 49%    Lower Body Dressing/Undressing Lower body dressing      What is the patient wearing?: Incontinence brief, Pants     Lower body assist Assist for lower body dressing: Maximal Assistance - Patient 25 - 49%     Toileting Toileting    Toileting assist Assist for toileting: Maximal Assistance - Patient 25 - 49%     Transfers Chair/bed transfer  Transfers assist     Chair/bed transfer assist level: Maximal Assistance - Patient 25 - 49%     Locomotion Ambulation   Ambulation assist      Assist level: 2 helpers Assistive device: Other (comment)(hallway rail) Max distance: 65ft   Walk 10 feet activity   Assist     Assist level: 2 helpers Assistive device: Other (comment)(hallway rail)   Walk 50 feet activity   Assist Walk  50 feet with 2 turns activity did not occur: Safety/medical concerns         Walk 150 feet activity   Assist Walk 150 feet activity did not occur: Safety/medical concerns         Walk 10 feet on uneven surface  activity   Assist Walk 10 feet on uneven surfaces activity did not occur: Safety/medical concerns         Wheelchair     Assist Will patient use wheelchair at discharge?: (TBD)             Wheelchair 50 feet with 2 turns activity    Assist            Wheelchair 150 feet activity     Assist            1.  Left-hemiparesis/dysphagia  secondary to SAH/intraparenchymal hematoma.  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  Antithrombotics: -DVT/anticoagulation: Subcutaneous Lovenox initiated 11/22/2019.  Monitor for any bleeding episodes  CBC reviewed today             -antiplatelet therapy: N/A 3. Pain Management: Oxycodone as needed 4. Mood: Xanax 1 mg every 6 hours as needed---limit as possible             -antipsychotic agents: N/A 5. Neuropsych: This patient is not capable of making decisions on her own behalf.  -consider ritalin trial to improve attention and initiation 6. Skin/Wound Care: Routine skin checks 7. Fluids/Electrolytes/Nutrition: Routine in and outs.    -NGT changed to night to help with appetite during the day.   -consider decreasing feeds to further help app  -protein for low albumin  -reduce IVF to 50cc/hr 8. Seizure prophylaxis.   Continue Keppra 1000 mg twice daily 9.  Post stroke dysphagia:   -continue Dysphagia #1 thins liquids.   -advance per speech therapy 10. Hypertension.  Completing course of Nimotop.                Controlled on 1/18  Monitor with increased mobility 11. Hypokalemia  Potassium up to 4.4 1/18 12. Increased LFT's. Likely reactive  -recheck over the next week or so    LOS: 2 days A FACE TO FACE EVALUATION WAS PERFORMED  Ranelle Oyster 12/04/2019, 12:26 PM

## 2019-12-04 NOTE — Progress Notes (Signed)
Physical Therapy Session Note  Patient Details  Name: Olivia Mann MRN: [id number] Date of Birth: 10-20-1956  Today's Date: 12/04/2019 PT Individual Time: 4259-5638 PT Individual Time Calculation (min): 69 min   Short Term Goals: Week 1:  PT Short Term Goal 1 (Week 1): Pt will perform supine<>sit with min assist PT Short Term Goal 2 (Week 1): Pt will perform sit<>stand with mod assist PT Short Term Goal 3 (Week 1): Pt will perform bed<>chair transfers with mod assist  Skilled Therapeutic Interventions/Progress Updates:  Pt received in bed, lethargic but reporting her lunch sounds good to eat. Pt requires encouragement & mod assist to transfer supine>sitting EOB and min/mod assist for static sitting balance at EOB. Pt's pants soiled & pt transferred sit<>stand at EOB with +2 assist to allow therapist and NT to change pants dependent assist. Pt transfers bed>w/c with +2 assist for safety but pt participating in movement. Pt requires assistance with scooting back in w/c seat & will benefit from alternate TIS w/c as this one is significantly elevated off ground. Pt requires cuing to sit back in seat as pt leans forward with head flexed forward. Pt participated in wiping face with washcloth with assistance. Transported pt to gym via w/c dependent assist. Pt set up & utilized dynavision from w/c level with activity focusing on scanning to L of midline, sustained attention to task, and sitting balance with pt requiring MAX multimodal cuing & hand over hand assistance to locate lights and pt with very poor sustained attention to task (a couple seconds at most). Provided pt with lap tray to prevent pt from leaning too far forward in w/c.  Returned to room & played reggaeton music (pt reports she enjoys this genre) in attempts to have pt scan to midline or L to locate location of music origin but no attention to music despite cuing and therapist attempting to turn head to look towards speakers and pt even closing  eyes & drifting off to sleep. At end of session pt left sitting in w/c with lap tray donned & at nurses station.  Pt did not become alert enough during session to where this therapist felt comfortable assisting her with consuming meal.  Therapy Documentation Precautions:  Precautions Precautions: Fall Precaution Comments: L neglect, L hemiparesis Restrictions Weight Bearing Restrictions: No   Pain: Pt appears restless throughout session but denies c/o pain.    Therapy/Group: Individual Therapy  Sandi Mariscal 12/04/2019, 3:36 PM

## 2019-12-04 NOTE — Progress Notes (Signed)
Initial Nutrition Assessment  RD working remotely.  DOCUMENTATION CODES:   Not applicable  INTERVENTION:   Transition to nocturnal tube feeds via Cortrak: - Jevity 1.2 @ 90 ml/hr to run over 10 hours from 2000 to 0600 - Pro-stat 30 ml BID - Free water per MD/PA  Nocturnal tube feeding regimen provides 1280 kcal, 80 grams of protein, and 726 ml of H2O, (75% kcal needs, 94% protein needs).  - Ensure Enlive po TID, each supplement provides 350 kcal and 20 grams of protein  NUTRITION DIAGNOSIS:   Increased nutrient needs related to post-op healing, other (therapies) as evidenced by estimated needs.  GOAL:   Patient will meet greater than or equal to 90% of their needs  MONITOR:   PO intake, Supplement acceptance, Diet advancement, Labs, Weight trends, TF tolerance  REASON FOR ASSESSMENT:   Consult Enteral/tube feeding initiation and management  ASSESSMENT:   64 year old female with unremarkable PMH. Pt presented on 11/18/19 with seizures and headaches. Cranial CT revealed SAH. 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 Pt underwent right frontotemporal craniotomy for clipping of internal carotid artery aneurysm as well as evacuation of right temporal hematoma on 12/16/19. Hospital course complicated by dysphagia, and pt was started on Dyshagia 1 diet with thin liquids as well as nasogastric tube feeds for nutritional support. Pt admitted to CIR on 1/16.   Cortrak remains in place with TF currently infusing.  Weight trending up since admit to CIR.  RD will switch to nocturnal tube feeds to promote PO intake during the day. Discussed with RN.  Current TF: Osmolite 1.2 @ 50 ml/hr, Pro-stat 30 ml daily  Meal Completion: 0-50% x last 5 recorded meals  Medications reviewed and include: colace, SSI q 4 hours, protonix IVF: NS @ 75 ml/hr  Labs reviewed: sodium 134, elevated LFTs CBG's: 100-134 x 24  hours  NUTRITION - FOCUSED PHYSICAL EXAM:  Unable to complete at this time. RD working remotely.  Diet Order:   Diet Order            DIET - DYS 1 Room service appropriate? Yes; Fluid consistency: Thin  Diet effective now              EDUCATION NEEDS:   No education needs have been identified at this time  Skin:  Skin Assessment: Skin Integrity Issues: Incisions: head  Last BM:  12/03/19 type 7  Height:   Ht Readings from Last 1 Encounters:  12/02/19 5\' 1"  (1.549 m)    Weight:   Wt Readings from Last 1 Encounters:  12/04/19 57.2 kg    Ideal Body Weight:  47.7 kg  BMI:  Body mass index is 23.83 kg/m.  Estimated Nutritional Needs:   Kcal:  1700-1900  Protein:  85-100 grams  Fluid:  >/= 1.7 L    12/06/19, MS, RD, LDN Inpatient Clinical Dietitian Pager: [phone] Weekend/After Hours: [phone]

## 2019-12-05 ENCOUNTER — Inpatient Hospital Stay (HOSPITAL_COMMUNITY): Payer: Medicaid - Out of State | Admitting: Physical Therapy

## 2019-12-05 ENCOUNTER — Inpatient Hospital Stay (HOSPITAL_COMMUNITY): Payer: Medicaid - Out of State | Admitting: Speech Pathology

## 2019-12-05 ENCOUNTER — Inpatient Hospital Stay (HOSPITAL_COMMUNITY): Payer: Self-pay

## 2019-12-05 ENCOUNTER — Inpatient Hospital Stay (HOSPITAL_COMMUNITY): Payer: Medicaid - Out of State

## 2019-12-05 LAB — CBC
HCT: 35.7 % — ABNORMAL LOW (ref 36.0–46.0)
Hemoglobin: 11.8 g/dL — ABNORMAL LOW (ref 12.0–15.0)
MCH: 33.6 pg (ref 26.0–34.0)
MCHC: 33.1 g/dL (ref 30.0–36.0)
MCV: 101.7 fL — ABNORMAL HIGH (ref 80.0–100.0)
Platelets: 584 10*3/uL — ABNORMAL HIGH (ref 150–400)
RBC: 3.51 MIL/uL — ABNORMAL LOW (ref 3.87–5.11)
RDW: 13.3 % (ref 11.5–15.5)
WBC: 7.1 10*3/uL (ref 4.0–10.5)
nRBC: 0 % (ref 0.0–0.2)

## 2019-12-05 LAB — URINALYSIS, ROUTINE W REFLEX MICROSCOPIC
Bilirubin Urine: NEGATIVE
Glucose, UA: NEGATIVE mg/dL
Hgb urine dipstick: NEGATIVE
Ketones, ur: NEGATIVE mg/dL
Leukocytes,Ua: NEGATIVE
Nitrite: NEGATIVE
Protein, ur: NEGATIVE mg/dL
Specific Gravity, Urine: 1.01 (ref 1.005–1.030)
pH: 6 (ref 5.0–8.0)

## 2019-12-05 LAB — GLUCOSE, CAPILLARY
Glucose-Capillary: 102 mg/dL — ABNORMAL HIGH (ref 70–99)
Glucose-Capillary: 111 mg/dL — ABNORMAL HIGH (ref 70–99)
Glucose-Capillary: 102 mg/dL — ABNORMAL HIGH (ref 70–99)
Glucose-Capillary: 102 mg/dL — ABNORMAL HIGH (ref 70–99)
Glucose-Capillary: 82 mg/dL (ref 70–99)
Glucose-Capillary: 98 mg/dL (ref 70–99)

## 2019-12-05 MED ORDER — SODIUM CHLORIDE 0.9 % IV SOLN: INTRAVENOUS | Status: DC

## 2019-12-05 MED ORDER — JEVITY 1.2 CAL PO LIQD
900.0000 mL | ORAL | Status: DC
  Administered 2019-12-05: 900 mL
  Filled 2019-12-05 (×2): qty 948

## 2019-12-05 NOTE — Plan of Care (Signed)
  Problem: Consults Goal: RH GENERAL PATIENT EDUCATION Description: See Patient Education module for education specifics. Outcome: Progressing Goal: Skin Care Protocol Initiated - if Braden Score 18 or less Description: If consults are not indicated, leave blank or document N/A Outcome: Progressing Goal: Nutrition Consult-if indicated Outcome: Progressing   Problem: RH BOWEL ELIMINATION Goal: RH STG MANAGE BOWEL WITH ASSISTANCE Description: STG Manage Bowel with Assistance. Outcome: Progressing   Problem: RH BLADDER ELIMINATION Goal: RH STG MANAGE BLADDER WITH ASSISTANCE Description: STG Manage Bladder With Assistance Outcome: Progressing   Problem: RH SKIN INTEGRITY Goal: RH STG SKIN FREE OF INFECTION/BREAKDOWN Outcome: Progressing   Problem: RH SAFETY Goal: RH STG ADHERE TO SAFETY PRECAUTIONS W/ASSISTANCE/DEVICE Description: STG Adhere to Safety Precautions With Assistance/Device. Outcome: Progressing Goal: RH STG DECREASED RISK OF FALL WITH ASSISTANCE Description: STG Decreased Risk of Fall With Assistance. Outcome: Progressing   Problem: RH PAIN MANAGEMENT Goal: RH STG PAIN MANAGED AT OR BELOW PT'S PAIN GOAL Outcome: Progressing   Problem: RH KNOWLEDGE DEFICIT GENERAL Goal: RH STG INCREASE KNOWLEDGE OF SELF CARE AFTER HOSPITALIZATION Outcome: Progressing

## 2019-12-05 NOTE — Progress Notes (Signed)
Oral temp of 100.2 this afternoon. Jesusita Oka, PA made aware. New orders obtained. Other vital signs WNL. C/o headache. Tylenol given. Will continue to monitor.   Marylu Lund, RN

## 2019-12-05 NOTE — Progress Notes (Signed)
Speech Language Pathology Daily Session Note  Patient Details  Name: Olivia Mann MRN: [id number] Date of Birth: [birth year]  Today's Date: 12/05/2019 SLP Individual Time: 7078-6754 SLP Individual Time Calculation (min): 55 min  Short Term Goals: Week 1: SLP Short Term Goal 1 (Week 1): Pt will consume therapeutic trials of dys 2 textures with min cues for use of swallowing precautions and minimal overt s/s of aspiration over 3 consecutive sessions prior to advancement. SLP Short Term Goal 2 (Week 1): Pt will locate items at midline during functional tasks with mod assist verbal cues. SLP Short Term Goal 3 (Week 1): Pt will sustain her attention to basic, familiar tasks for 3 minutes with mod verbal cues for redirection. SLP Short Term Goal 4 (Week 1): Pt will complete basic, familiar tasks wtih mod assist for functional problem solving  Skilled Therapeutic Interventions: Skilled treatment session focused on dysphagia and cognitive goals. SLP facilitated session by providing Mod A verbal cues for use of swallowing compensatory strategies with breakfast meal of Dys. 1 textures with thin liquids. Patient with overt s/s of aspiration with large sequential sips which was reduced with single sips via straw.  Patient independently requested to use the bathroom but required Max A multimodal cues for problem solving due to impulsivity. Patient was continent of urine and transferred to the wheelchair. Max A verbal cues were needed for initiation and sustained attention to a basic card task with Max-Total A to scan to midline throughout session. Patient with intermittent confabulation throughout session and required constant cues for redirection. Patient left upright in wheelchair with alarm on at RN station. Continue with current plan of care.      Pain Pain Assessment Pain Scale: Faces Faces Pain Scale: No hurt  Therapy/Group: Individual Therapy  Paizley Ramella 12/05/2019, 10:11 AM

## 2019-12-05 NOTE — Progress Notes (Signed)
[place] PHYSICAL MEDICINE & REHABILITATION PROGRESS NOTE  Subjective/Complaints: Pt getting up to [facility] with staff. C/o headache. Able to sleep. Thought she was in [place] ROS: Limited due to cognitive/behavioral     Objective: Vital Signs: Blood pressure 125/71, pulse 94, temperature 97.6 F (36.4 C), resp. rate 18, height 5\' 1"  (1.549 m), weight 57.2 kg, SpO2 98 %. No results found. Recent Labs    12/04/19 0535  WBC 7.7  HGB 11.9*  HCT 35.3*  PLT 502*   Recent Labs    12/04/19 0535  NA 134*  K 4.4  CL 99  CO2 24  GLUCOSE 102*  BUN 7*  CREATININE 0.50  CALCIUM 9.4    Physical Exam: BP 125/71   Pulse 94   Temp 97.6 F (36.4 C)   Resp 18   Ht 5\' 1"  (1.549 m)   Wt 57.2 kg   SpO2 98%   BMI 23.83 kg/m  Constitutional: No distress . Vital signs reviewed. HEENT: EOMI, oral membranes moist, crani incision right scalp Neck: supple Cardiovascular: RRR without murmur. No JVD    Respiratory: CTA Bilaterally without wheezes or rales. Normal effort    GI: BS +, non-tender, non-distended  Skin: staples along right scalp/forehead Psych: still distracted. Left inattention Musc: No edema in extremities.  No tenderness in extremities. Neurological: Alert and oriented to person and hospital, follows simple commands.  Right gaze preference, looks downward.  Limited awareness of deficits Decreased pain sense left arm and leg. LUE and LLE 0/5  Assessment/Plan: 1. Functional deficits secondary to subarachnoid hemorrhage/hematoma status post right frontotemporal craniotomy which require 3+ hours per day of interdisciplinary therapy in a comprehensive inpatient rehab setting.  Physiatrist is providing close team supervision and 24 hour management of active medical problems listed below.  Physiatrist and rehab team continue to assess barriers to discharge/monitor patient progress toward functional and medical goals  Care Tool:  Bathing    Body parts bathed by patient:  Chest, Abdomen, Front perineal area, Right upper leg, Left upper leg, Face   Body parts bathed by helper: Left arm, Right arm, Buttocks, Right lower leg, Left lower leg     Bathing assist Assist Level: Maximal Assistance - Patient 24 - 49%     Upper Body Dressing/Undressing Upper body dressing   What is the patient wearing?: Bra    Upper body assist Assist Level: Maximal Assistance - Patient 25 - 49%    Lower Body Dressing/Undressing Lower body dressing      What is the patient wearing?: Incontinence brief, Pants     Lower body assist Assist for lower body dressing: Maximal Assistance - Patient 25 - 49%     Toileting Toileting    Toileting assist Assist for toileting: Maximal Assistance - Patient 25 - 49%     Transfers Chair/bed transfer  Transfers assist     Chair/bed transfer assist level: 2 Helpers     Locomotion Ambulation   Ambulation assist      Assist level: 2 helpers Assistive device: Other (comment)(hallway rail) Max distance: 24ft   Walk 10 feet activity   Assist     Assist level: 2 helpers Assistive device: Other (comment)(hallway rail)   Walk 50 feet activity   Assist Walk 50 feet with 2 turns activity did not occur: Safety/medical concerns         Walk 150 feet activity   Assist Walk 150 feet activity did not occur: Safety/medical concerns  Walk 10 feet on uneven surface  activity   Assist Walk 10 feet on uneven surfaces activity did not occur: Safety/medical concerns         Wheelchair     Assist Will patient use wheelchair at discharge?: (TBD)             Wheelchair 50 feet with 2 turns activity    Assist            Wheelchair 150 feet activity     Assist            1.  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  Antithrombotics: -DVT/anticoagulation: Subcutaneous Lovenox initiated 11/22/2019.  Monitor for any bleeding episodes               -antiplatelet therapy: N/A 3. Pain Management: Oxycodone as needed 4. Mood: Xanax 1 mg every 6 hours as needed---limit as possible             -antipsychotic agents: N/A 5. Neuropsych: This patient is not capable of making decisions on her own behalf.  -consider ritalin trial to improve attention and initiation 6. Skin/Wound Care: Routine skin checks 7. Fluids/Electrolytes/Nutrition: Routine in and outs.    -NGT changed to night to help with appetite during the day.   -consider decreasing feeds to further help app  -protein for low albumin  -change IVF to HS only to help with therapies 8. Seizure prophylaxis.   Continue Keppra 1000 mg twice daily 9.  Post stroke dysphagia:    -continue Dysphagia #1 thins liquids.   -advance per speech therapy  1/19: reduce TF by half to help promote appetite 10. Hypertension.  Completing course of Nimotop.                Controlled on 1/18  Monitor with increased mobility 11. Hypokalemia  Potassium up to 4.4 1/18 12. Increased LFT's. Likely reactive  -recheck next week    LOS: 3 days A FACE TO FACE EVALUATION WAS PERFORMED  Ranelle Oyster 12/05/2019, 11:28 AM

## 2019-12-05 NOTE — Progress Notes (Signed)
Occupational Therapy Session Note  Patient Details  Name: Olivia Mann MRN: [id number] Date of Birth: Aug 05, 1956  Today's Date: 12/05/2019 OT Individual Time: 1300-1420 OT Individual Time Calculation (min): 80 min    Short Term Goals: Week 1:  OT Short Term Goal 1 (Week 1): Pt will complete BSC transfer with Mod A OT Short Term Goal 2 (Week 1): Pt will complete LB self care with Mod A for dynamic standing balance OT Short Term Goal 3 (Week 1): Pt will utilize head turns towards the Lt to locate 2 items needed during ADL session with mod cuing  Skilled Therapeutic Interventions/Progress Updates:    Pt received supine with NT attending to toileting needs, placing pt on bed pan. Pt had no void. Pt impulsively attempting to come EOB several times. Pt transferred to EOB with min A, total A for safe placement of LUE in neutral position. Pt completed 3x sit <> stand to don new brief and pull up pants, max A overall of 1 person. Pt only requiring mod A to stand. Pt completed stand pivot transfer to the TIS w/c with mod A. She demonstrated improved scooting this session, requiring only mod A to scoot back efficiently in chair. Pt required max A don and doff shirt 2/2 very tight tank top. Pt able to wash UB with min A overall. Pt completed oral care with set up assist and mod cueing for initiation. Pt ate lunch up in the TIS w/c with mod cueing required for attention to task and for locating food on the plate. In order to scoop food pt required that plate be placed almost entirely in R visual field. Once done, pt was able to feed herself 25% of the meal. 1 coughing episode occurred during thin liquids but no further s/s of aspiration. Pt was then taken down to the therapy gym where she completed stand pivot transfer to the mat with mod A. Pt sat EOM with no LOB however R lean with increased fatigue. Activity EOM focused on visual scanning to midline and to L, requiring max cueing for physical head turn and  locating any stimuli. Pt was cued to retrieve high contrast items from the mirror and then place them on her L hand to increase L body awareness. Max cueing overall required. Pt was transferred back to her w/c and returned to her room. Her daughter arrived and was given brief update re d/c planning and CLOF. Pt left up in TIS with daughter present. Safety belt fastened.   Therapy Documentation Precautions:  Precautions Precautions: Fall Precaution Comments: L neglect, L hemiparesis Restrictions Weight Bearing Restrictions: No   Therapy/Group: Individual Therapy  [name] 12/05/2019, 7:30 AM

## 2019-12-05 NOTE — Progress Notes (Signed)
Physical Therapy Session Note  Patient Details  Name: Olivia Mann MRN: [id number] Date of Birth: July 29, 1956  Today's Date: 12/05/2019 PT Individual Time: 1035-1100 PT Individual Time Calculation (min): 25 min   Short Term Goals: Week 1:  PT Short Term Goal 1 (Week 1): Pt will perform supine<>sit with min assist PT Short Term Goal 2 (Week 1): Pt will perform sit<>stand with mod assist PT Short Term Goal 3 (Week 1): Pt will perform bed<>chair transfers with mod assist  Skilled Therapeutic Interventions/Progress Updates:   Pt received in w/c, reluctant to participate in therapy as she was requesting to return to bed. Agreeable to attempt therapy if therapist assisted her to bed at end of session. Total assist w/c transport to/from therapy gym. Worked on sit<>stands to R rail, performed w/ max assist w/ verbal and tactile cues for LLE extension once up in stance. Performed multiple reps of R forward and backward stepping for forced WB through LLE w/ max assist for LLE management. Pt would impulsively sit, needed max encouragement to continue as she was very perseverative on environmental distractions. Transported to day room and set-up on kinetron. Attempted to work on BLE reciprocal movement pattern and LLE activation, however pt very minimally participating. Pt covering her head with a blanket and keeping eyes closed. Total assist from therapist to perform kinetron. Deferred further activity 2/2 fatigue/refusal and decreased ability to meaningfully participate in therapy. Returned to room and performed stand pivot to EOB w/ max assist. Ended session in supine, all needs in reach. Missed 35 min of skilled PT.  Therapy Documentation Precautions:  Precautions Precautions: Fall Precaution Comments: L neglect, L hemiparesis Restrictions Weight Bearing Restrictions: No General: PT Amount of Missed Time (min): 35 Minutes PT Missed Treatment Reason: Patient fatigue;Patient unwilling to  participate Vital Signs: Therapy Vitals Pulse Rate: 94 BP: 125/71 Oxygen Therapy SpO2: 98 %  Therapy/Group: Individual Therapy  Jewelia Bocchino Melton Krebs 12/05/2019, 11:56 AM

## 2019-12-05 NOTE — Patient Care Conference (Signed)
Inpatient RehabilitationTeam Conference and Plan of Care Update Date: 12/05/2019   Time: 10:15 AM    Patient Name: Olivia Mann      Medical Record Number: [id number]  Date of Birth: [birth year] Sex: Female         Room/Bed: 4W23C/4W23C-01 Payor Info: Payor: MEDICAID POTENTIAL / Plan: MEDICAID POTENTIAL / Product Type: *No Product type* /    Admit Date/Time:  12/02/2019 12:11 AM  Primary Diagnosis:  <principal problem not specified>  Patient Active Problem List   Diagnosis Date Noted  . Hypokalemia   . Dysphagia, post-stroke   . Seizure prophylaxis   . Hemiparesis affecting left side as late effect of stroke ([place])   . SAH (subarachnoid hemorrhage) ([place])   . Tachypnea   . Tachycardia   . Hypertension   . Leukocytosis   . Cytotoxic brain edema ([place]) 11/20/2019  . Seizures ([place]) 11/20/2019  . Subarachnoid hemorrhage ([place])   . Endotracheally intubated   . Macrocytosis without anemia   . Encephalopathy acute   . Subarachnoid hematoma ([place]) 11/18/2019    Expected Discharge Date: Expected Discharge Date: 12/27/19  Team Members Present: Physician leading conference: Dr. Alger Simons Social Worker Present: Lennart Pall, LCSW Nurse Present: Dorien Chihuahua, RN;Mekides Nida, RN Case Manager: Karene Fry, RN PT Present: Burnard Bunting, PT OT Present: Laverle Hobby, OT SLP Present: Weston Anna, SLP PPS Coordinator present : Ileana Ladd, Burna Mortimer, SLP     Current Status/Progress Goal Weekly Team Focus  Bowel/Bladder   pt is continent /incontinent of B&B LMB- 1/18  continue  to help pt with tolietng needs  assess tolieting needs qshift and prn   Swallow/Nutrition/ Hydration   Dys. 1 textures with thin liquids, Mod A  Supervision  tolerance of diet and use of strategies   ADL's   Poor arousal and initiation requiring mod-max cueing. Max A UB dressing, max A LB dressing, mod-max A transfers  min A- CGA overall  initiation, arousal, ADL retraining, ADL transfers, cognitive  retraining   Mobility   mod-max assist transfers, max-total +2 gait at rail x30'  CGA overall  participation and arousal, transfers, LLE NMR   Communication             Safety/Cognition/ Behavioral Observations  Max-Total A  Min A  attention, scanning to midline, awareness   Pain   pt c/o of pain to right anterior surgical area of head, prn tyneol and oxy controlled  keep pain level down to 3  assess pain qshift and prn   Skin   pt has surgical scar to the right anterior side of head see LDA  remain free of any other new skin breakdown  assess skin qshift and prn    Rehab Goals Patient on target to meet rehab goals: Yes *See Care Plan and progress notes for long and short-term goals.     Barriers to Discharge  Current Status/Progress Possible Resolutions Date Resolved   Nursing  Home environment access/layout;Incontinence;Wound Care;Medication compliance;Behavior;Nutrition means               PT  Inaccessible home environment;Home environment access/layout;Nutrition means                 OT Medical stability;Incontinence;Behavior;Home environment access/layout  20 stairs to enter home             SLP                SW  Discharge Planning/Teaching Needs:  Plan to return home with daughter an dson in [place] who can provide 24/7 assistance  Teaching needs TBD   Team Discussion: Crani, dysphagia, L hemi.  RN loose BMs, fidgeting, headache.  OT mod/max transfers.  PT max A 30', min A goals.  SLP D1thins, max/total for cognition.   Revisions to Treatment Plan: N/A     Medical Summary Current Status: right temporal SAH d/t ruptured aneurysm s/p clipping, crani. ongoing headaches, left HP, inattention Weekly Focus/Goal: dysphagia, nutriiton, sleep, pain, cognition  Barriers to Discharge: Behavior;Medical stability       Continued Need for Acute Rehabilitation Level of Care: The patient requires daily medical management by a physician with specialized training in  physical medicine and rehabilitation for the following reasons: Direction of a multidisciplinary physical rehabilitation program to maximize functional independence : Yes Medical management of patient stability for increased activity during participation in an intensive rehabilitation regime.: Yes Analysis of laboratory values and/or radiology reports with any subsequent need for medication adjustment and/or medical intervention. : Yes   I attest that I was present, lead the team conference, and concur with the assessment and plan of the team.   Trish Mage 12/05/2019, 6:42 PM   Team conference was held via web/ teleconference due to COVID - 19

## 2019-12-06 ENCOUNTER — Inpatient Hospital Stay (HOSPITAL_COMMUNITY): Payer: Self-pay | Admitting: Physical Therapy

## 2019-12-06 ENCOUNTER — Inpatient Hospital Stay (HOSPITAL_COMMUNITY): Payer: Self-pay

## 2019-12-06 ENCOUNTER — Inpatient Hospital Stay (HOSPITAL_COMMUNITY): Payer: Self-pay | Admitting: Speech Pathology

## 2019-12-06 LAB — GLUCOSE, CAPILLARY
Glucose-Capillary: 103 mg/dL — ABNORMAL HIGH (ref 70–99)
Glucose-Capillary: 107 mg/dL — ABNORMAL HIGH (ref 70–99)
Glucose-Capillary: 108 mg/dL — ABNORMAL HIGH (ref 70–99)
Glucose-Capillary: 111 mg/dL — ABNORMAL HIGH (ref 70–99)
Glucose-Capillary: 77 mg/dL (ref 70–99)
Glucose-Capillary: 99 mg/dL (ref 70–99)

## 2019-12-06 LAB — URINE CULTURE

## 2019-12-06 MED ORDER — JEVITY 1.2 CAL PO LIQD
450.0000 mL | ORAL | Status: DC
  Administered 2019-12-06 – 2019-12-10 (×5): 450 mL
  Filled 2019-12-06 (×6): qty 474

## 2019-12-06 NOTE — Progress Notes (Signed)
Physical Therapy Session Note  Patient Details  Name: Olivia Mann MRN: [id number] Date of Birth: 15-Jan-1956  Today's Date: 12/06/2019 PT Individual Time: 0910-0954 PT Individual Time Calculation (min): 44 min   Short Term Goals: Week 1:  PT Short Term Goal 1 (Week 1): Pt will perform supine<>sit with min assist PT Short Term Goal 2 (Week 1): Pt will perform sit<>stand with mod assist PT Short Term Goal 3 (Week 1): Pt will perform bed<>chair transfers with mod assist  Skilled Therapeutic Interventions/Progress Updates:   Pt missed 16 min of skilled PT at beginning of session 2/2 RN care. Supine>sit w/ mod assist and donned shirt and pants at seated level w/ max assist. Mod assist sit>stand transfer to pull pants over hips. Donned gym shoes w/ total assist as well. Pt's TIS w/ broken levers and is unable to return to full upright. Switched to new manual w/c w/ hard back to assist w/ postural control. Mod assist stand pivot to w/c. Worked on manual w/c mobility on way to therapy gym. Max verbal/tactile cues for obstacle avoidance on L side, R hemi technique, and attention to task. Pt very distracted by busier environments, but able to self-propel w/o much manual assist x25' w/ extra time. Worked on gait training at rail, ambulated 15' at rail w/ mod-max assist for upright balance, total assist for LLE management and 2nd helper w/ w/c follow for safety. Ended session w/ pt in w/c, under direct supervision at nurses station, lap tray and chair alarm belt engaged for safety. All needs met.   Therapy Documentation Precautions:  Precautions Precautions: Fall Precaution Comments: L neglect, L hemiparesis Restrictions Weight Bearing Restrictions: No General: PT Amount of Missed Time (min): 16 Minutes PT Missed Treatment Reason: Nursing care Vital Signs: Therapy Vitals Pulse Rate: 82 BP: 121/72 Patient Position (if appropriate): Lying  Therapy/Group: Individual Therapy  Geo Slone Clent Demark 12/06/2019, 9:56 AM

## 2019-12-06 NOTE — Progress Notes (Signed)
[place] PHYSICAL MEDICINE & REHABILITATION PROGRESS NOTE  Subjective/Complaints: Up in bed. Thought I was her friend from last night. "I want to get up and moving". Denies pain this morning. Did remember my name when I mentioned who I am  ROS: Limited due to cognitive/behavioral    Objective: Vital Signs: Blood pressure 121/72, pulse 82, temperature 99 F (37.2 C), temperature source Oral, resp. rate 17, height 5\' 1"  (1.549 m), weight 55.5 kg, SpO2 100 %. DG Chest 2 View  Result Date: 12/05/2019 CLINICAL DATA:  Fever x1 day. EXAM: CHEST - 2 VIEW COMPARISON:  November 21, 2019 FINDINGS: A nasogastric tube is seen with its distal end extending below the level of the diaphragm. This represents a new finding when compared to the prior study. Very mild, hazy atelectasis and/or early infiltrate is seen within the right lung base. There is no evidence of a pleural effusion or pneumothorax. The heart size and mediastinal contours are within normal limits. The visualized skeletal structures are unremarkable. IMPRESSION: 1. Interval nasogastric tube placement and positioning, as described above, when compared to the prior study dated November 21, 2019. 2. Very mild, hazy right basilar atelectasis and/or infiltrate. This also represents a new finding when compared to the prior exam. Electronically Signed   By: Virgina Norfolk M.D.   On: 12/05/2019 18:34   Recent Labs    12/04/19 0535 12/05/19 1641  WBC 7.7 7.1  HGB 11.9* 11.8*  HCT 35.3* 35.7*  PLT 502* 584*   Recent Labs    12/04/19 0535  NA 134*  K 4.4  CL 99  CO2 24  GLUCOSE 102*  BUN 7*  CREATININE 0.50  CALCIUM 9.4    Physical Exam: BP 121/72 (BP Location: Left Arm)   Pulse 82   Temp 99 F (37.2 C) (Oral)   Resp 17   Ht 5\' 1"  (1.549 m)   Wt 55.5 kg   SpO2 100%   BMI 23.12 kg/m  Constitutional: No distress . Vital signs reviewed. HEENT: EOMI, oral membranes moist Neck: supple Cardiovascular: RRR without murmur. No JVD     Respiratory: CTA Bilaterally without wheezes or rales. Normal effort    GI: BS +, non-tender, non-distended  Skin: staples along right scalp/forehead Psych: still distracted. restless Musc: No edema in extremities.  No tenderness in extremities. Neurological: Alert and oriented to person and hospital, follows commands. distracte  Right gaze preference, looks downward--no change.  Limited awareness of deficits Decreased pain sense left arm and leg. LUE and LLE 0/5  Assessment/Plan: 1. Functional deficits secondary to subarachnoid hemorrhage/hematoma status post right frontotemporal craniotomy which require 3+ hours per day of interdisciplinary therapy in a comprehensive inpatient rehab setting.  Physiatrist is providing close team supervision and 24 hour management of active medical problems listed below.  Physiatrist and rehab team continue to assess barriers to discharge/monitor patient progress toward functional and medical goals  Care Tool:  Bathing    Body parts bathed by patient: Chest, Abdomen, Front perineal area, Right upper leg, Left upper leg, Face   Body parts bathed by helper: Left arm, Right arm, Buttocks, Right lower leg, Left lower leg     Bathing assist Assist Level: Maximal Assistance - Patient 24 - 49%     Upper Body Dressing/Undressing Upper body dressing   What is the patient wearing?: Bra    Upper body assist Assist Level: Maximal Assistance - Patient 25 - 49%    Lower Body Dressing/Undressing Lower body dressing  What is the patient wearing?: Incontinence brief, Pants     Lower body assist Assist for lower body dressing: Maximal Assistance - Patient 25 - 49%     Toileting Toileting    Toileting assist Assist for toileting: Maximal Assistance - Patient 25 - 49%     Transfers Chair/bed transfer  Transfers assist     Chair/bed transfer assist level: Moderate Assistance - Patient 50 - 74%     Locomotion Ambulation   Ambulation  assist      Assist level: 2 helpers Assistive device: Other (comment)(rail in hallway) Max distance: 15'   Walk 10 feet activity   Assist     Assist level: 2 helpers Assistive device: Other (comment)(rail in hallway)   Walk 50 feet activity   Assist Walk 50 feet with 2 turns activity did not occur: Safety/medical concerns         Walk 150 feet activity   Assist Walk 150 feet activity did not occur: Safety/medical concerns         Walk 10 feet on uneven surface  activity   Assist Walk 10 feet on uneven surfaces activity did not occur: Safety/medical concerns         Wheelchair     Assist Will patient use wheelchair at discharge?: (TBD)             Wheelchair 50 feet with 2 turns activity    Assist            Wheelchair 150 feet activity     Assist            1.  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  Status post right frontotemporal craniotomy for clipping of internal carotid artery aneurysm as well as evacuation of right temporal hematoma 11/19/2019  Continue CIR PT, OT, SLP   2.  Antithrombotics: -DVT/anticoagulation: Subcutaneous Lovenox initiated 11/22/2019.  Monitor for any bleeding episodes               -antiplatelet therapy: N/A 3. Pain Management: Oxycodone as needed 4. Mood: Xanax 1 mg every 6 hours as needed---limit as possible             -antipsychotic agents: N/A 5. Neuropsych: This patient is not capable of making decisions on her own behalf.  -consider ritalin trial to improve attention and initiation 6. Skin/Wound Care: Routine skin checks 7. Fluids/Electrolytes/Nutrition: Routine in and outs.    -NG feeds decreased at night to help with appetite  -ate more yesterday  -protein for low albumin  -changed IVF to HS only to help with therapies  -encouraged pt to eat more so that we could remove ngt 8. Seizure prophylaxis.   Continue Keppra 1000 mg twice daily 9.  Post stroke  dysphagia:    -continue Dysphagia #1 thins liquids.   -advance per speech therapy  1/19: reduced TF by half to help promote appetite 10. Hypertension.  Completing course of Nimotop.                Controlled on 1/18  Monitor with increased mobility 11. Hypokalemia  Potassium up to 4.4 1/18 12. Increased LFT's. Likely reactive  -recheck next week    LOS: 4 days A FACE TO FACE EVALUATION WAS PERFORMED  Ranelle Oyster 12/06/2019, 10:51 AM

## 2019-12-06 NOTE — Progress Notes (Signed)
Speech Language Pathology Daily Session Note  Patient Details  Name: Olivia Mann MRN: [id number] Date of Birth: 03/28/1956  Today's Date: 12/06/2019 SLP Individual Time: 1300-1345 SLP Individual Time Calculation (min): 45 min  Short Term Goals: Week 1: SLP Short Term Goal 1 (Week 1): Pt will consume therapeutic trials of dys 2 textures with min cues for use of swallowing precautions and minimal overt s/s of aspiration over 3 consecutive sessions prior to advancement. SLP Short Term Goal 2 (Week 1): Pt will locate items at midline during functional tasks with mod assist verbal cues. SLP Short Term Goal 3 (Week 1): Pt will sustain her attention to basic, familiar tasks for 3 minutes with mod verbal cues for redirection. SLP Short Term Goal 4 (Week 1): Pt will complete basic, familiar tasks wtih mod assist for functional problem solving  Skilled Therapeutic Interventions: Skilled treatment session focused on dysphagia and cognitive goals. SLP facilitated session by providing Max-Total A to scan towards her left field of environment to locate items on her tray. Patient consumed Dys. 1 textures with thin liquids without overt s/s of aspiration and Min A verbal cues to self-monitor and correct left anterior spillage. Recommend patient continue current diet. Patient required Max A multimodal cues for functional problem solving during basic self-care tasks due to impulsivity and decreased attention. Patient's daughter present and educated on goals of skilled SLP intervention. She verbalized understanding. Patient left upright in wheelchair with alarm on at RN station. Continue with current plan of care.      Pain Pain Assessment Faces Pain Scale: Hurts a little bit  Therapy/Group: Individual Therapy  Eddith Mentor 12/06/2019, 2:46 PM

## 2019-12-06 NOTE — Progress Notes (Signed)
Occupational Therapy Session Note  Patient Details  Name: Olivia Mann MRN: [id number] Date of Birth: 1956-02-28  Today's Date: 12/06/2019 OT Individual Time: 1035-1130 OT Individual Time Calculation (min): 55 min    Short Term Goals: Week 1:  OT Short Term Goal 1 (Week 1): Pt will complete BSC transfer with Mod A OT Short Term Goal 2 (Week 1): Pt will complete LB self care with Mod A for dynamic standing balance OT Short Term Goal 3 (Week 1): Pt will utilize head turns towards the Lt to locate 2 items needed during ADL session with mod cuing  Skilled Therapeutic Interventions/Progress Updates:    Pt received sitting up in w/c at the nurses desk. C/o headache described as mild throughout session. RN alerted. ADLs completed at the sink with a focus on midline and L gaze. Pt requiring mod-max tactile and verbal cueing for L attention. Pt demonstrated improvement in L body awareness during UB bathing, requiring no cueing to wash under L UE, apply lotion to hand, and to apply deodorant. Mod A to don shirt with facilitation to begin hemi technique. Pt able to complete sit > stand with min A and then required mod A to remain standing for peri hygiene. Pt completed peri cleansing in standing with assistance only for balance. Pt completed oral care with set up assist.  In the dayroom pt complete standing level activity with BUE weightbearing through low table. Pt's LUE required total A for maintaining elbow extension in standing, as well as L knee blocking. Min-mod fluctuating assist in standing. Pt required max verbal and auditory cueing for L attention and scanning to midline. Pt was returned to her room with her daughter present, in wc with chair alarm and lap tray. Daughter told to let nursing staff know if she leaves the room.   Therapy Documentation Precautions:  Precautions Precautions: Fall Precaution Comments: L neglect, L hemiparesis Restrictions Weight Bearing Restrictions:  No  Therapy/Group: Individual Therapy  [name] 12/06/2019, 6:52 AM

## 2019-12-06 NOTE — Progress Notes (Signed)
Physical Therapy Session Note  Patient Details  Name: Olivia Mann MRN: [id number] Date of Birth: [birth year]  Today's Date: 12/06/2019 PT Individual Time: 1455-1535 PT Individual Time Calculation (min): 40 min   Short Term Goals: Week 1:  PT Short Term Goal 1 (Week 1): Pt will perform supine<>sit with min assist PT Short Term Goal 2 (Week 1): Pt will perform sit<>stand with mod assist PT Short Term Goal 3 (Week 1): Pt will perform bed<>chair transfers with mod assist  Skilled Therapeutic Interventions/Progress Updates: Pt presented in w/c at nsg station. Pt c/o pain 8/10 in head and back, nsg notified but not able to receive meds until after therapy. Pt then indicated need for bathroom. Pt transported to room with pt reaching for most objects on R with PTA requiring max redirection to allow PTA to remove lap tray and belt alarm. Once set up complete pt was able to perform stand pivot transfer with maxA and MAX multimodal cues. Pt attempted to pull down pants however due to absent safety awareness PTA blocked pt and performed clothing management maxA (+void). Pt the performed STS from toilet with modA and pt again attempted to perform clothing management but with heavy lean to L with PTA providing full support due to poor awareness. Once completed PTA placed pt at sink and pt washed R hand only. Pt then encouraged to scan to L to place paper towel in sink, pt ultimately turned head to midline then compensated by turning trunk to L however unable to locate trash can. Lap tray applied and pt transferred to hallway. PTA attempted to engage pt in w/c mobility and pt was able to propel using R hand only but was unable to reach R foot to floor to appropriately manage w/c. Pt also unable to sustain attention for more than a few feet. Ultimately PTA assisting in guiding w/c as pt used RUE for sustained task. Pt transported to rehab gym and performed stand pivot transfer with max A. Pt then participated in STS x 5  with modA and PTA providing manual facilitation to wt shift to L and blocking L knee. Pt then transferred to w/c squat pivot to R with PTA providing HOH assist for R hand placement on correct arm rest, but pt actually performing squat pivot with modA. Pt then transported back to room and performed stand pivot to R to bed maxA with PTA then modA sit to supine with assist for LLE management. With bed flat pt was able to scoot to [facility] then turning to R sidelying for comfort. Pt left with bed alarm on, call bell within reach and needs.      Therapy Documentation Precautions:  Precautions Precautions: Fall Precaution Comments: L neglect, L hemiparesis Restrictions Weight Bearing Restrictions: No General:   Vital Signs: Therapy Vitals Temp: 98.3 F (36.8 C) Temp Source: Oral Pulse Rate: 77 Resp: 18 BP: 133/79 Patient Position (if appropriate): Sitting Oxygen Therapy SpO2: 100 % O2 Device: Room Air    Therapy/Group: Individual Therapy  Lamekia Nolden  Gladys Gutman, PTA  12/06/2019, 3:48 PM

## 2019-12-06 NOTE — Progress Notes (Signed)
Orthopedic Tech Progress Note Patient Details:  Olivia Mann [birth year] [id number]  Patient ID: Olivia Mann, female   DOB: 05/12/1956, 64 y.o.   MRN: [id number] Called in order to [facility].  Trinna Post 12/06/2019, 9:32 AM

## 2019-12-07 ENCOUNTER — Inpatient Hospital Stay (HOSPITAL_COMMUNITY): Payer: Self-pay | Admitting: Physical Therapy

## 2019-12-07 ENCOUNTER — Inpatient Hospital Stay (HOSPITAL_COMMUNITY): Payer: Self-pay | Admitting: Speech Pathology

## 2019-12-07 ENCOUNTER — Encounter (HOSPITAL_COMMUNITY): Payer: Self-pay | Admitting: Physical Medicine & Rehabilitation

## 2019-12-07 ENCOUNTER — Inpatient Hospital Stay (HOSPITAL_COMMUNITY): Payer: Self-pay

## 2019-12-07 LAB — GLUCOSE, CAPILLARY
Glucose-Capillary: 103 mg/dL — ABNORMAL HIGH (ref 70–99)
Glucose-Capillary: 111 mg/dL — ABNORMAL HIGH (ref 70–99)
Glucose-Capillary: 115 mg/dL — ABNORMAL HIGH (ref 70–99)
Glucose-Capillary: 84 mg/dL (ref 70–99)
Glucose-Capillary: 97 mg/dL (ref 70–99)
Glucose-Capillary: 98 mg/dL (ref 70–99)

## 2019-12-07 MED ORDER — NICOTINE 14 MG/24HR TD PT24
14.0000 mg | MEDICATED_PATCH | Freq: Every day | TRANSDERMAL | Status: DC
  Administered 2019-12-07 – 2019-12-22 (×16): 14 mg via TRANSDERMAL
  Filled 2019-12-07 (×16): qty 1

## 2019-12-07 NOTE — Progress Notes (Signed)
Physical Therapy Session Note  Patient Details  Name: Olivia Mann MRN: [id number] Date of Birth: 21-Sep-1956  Today's Date: 12/07/2019 PT Individual Time: 1115-1200 PT Individual Time Calculation (min): 45 min   Short Term Goals: Week 1:  PT Short Term Goal 1 (Week 1): Pt will perform supine<>sit with min assist PT Short Term Goal 2 (Week 1): Pt will perform sit<>stand with mod assist PT Short Term Goal 3 (Week 1): Pt will perform bed<>chair transfers with mod assist Week 2:    Week 3:     Skilled Therapeutic Interventions/Progress Updates:    PAIN  Denies pain  Pt initially oob in wc and agreeable to treatment.  Transported to gym for time efficiency.  Sit to stand in parallel bars w/set up assist, cues to attend to and assist for achieving L foot positioning.  Gait in parallel bars length of bars x 4 including 4 turns w/mod assist due to LLE weakness and inattention.  Pt initially total assist for advancement of LLE and for stabilizing L knee w/loading/midstance to prevent both buckling and strong extension thrust into hyperextension.  Significant L hip weakness throuighout gait cycle.   Pt fitted w/prefab/trial AFO to L and repeated gait as above w/improved ability to initiate and occasionally achieve swing, requires assist for positioning due to scissoring/adduction, stabilization of knee as above, but improved overall stability.   Pt transported to hallway and repeated gait using railing 36ft x 3 w/mod assist, manual stabilization of L knee/LLE as above.  Transported to room at end of session and left OOB in wc w/seatbelt alarm set and daughter present, needs in reach. Daughter asking if we should wait to order wc/if pt will be ambulating at home.  Educated daughter that she is at very high fall risk, lacks the strength/control/perception/balance to attempt gait outside of skilled setting but that continued PT following dc would be recommended for futher training.  Daughter voiced  understanding.    Therapy Documentation Precautions:  Precautions Precautions: Fall Precaution Comments: L neglect, L hemiparesis Restrictions Weight Bearing Restrictions: No    Therapy/Group: Individual Therapy  Rada Hay, PT   Shearon Balo 12/07/2019, 12:32 PM

## 2019-12-07 NOTE — Progress Notes (Signed)
Nutrition Follow-up  RD working remotely.  DOCUMENTATION CODES:   Not applicable  INTERVENTION:   Continue nocturnal tube feeds via Cortrak: - Jevity 1.2 @ 45 ml/hr to run over 10 hours from 2000 to 0600 - Pro-stat 30 ml BID - Free water per MD/PA  Nocturnal tube feeding regimen provides 740 kcal, 55 grams of protein, and 363 ml of H2O, (44% kcal needs, 65% protein needs).  - Ensure Enlive po TID, each supplement provides 350 kcal and 20 grams of protein  NUTRITION DIAGNOSIS:   Increased nutrient needs related to post-op healing, other (therapies) as evidenced by estimated needs.  Ongoing, being addressed via supplements and TF  GOAL:   Patient will meet greater than or equal to 90% of their needs  Progressing  MONITOR:   PO intake, Supplement acceptance, Diet advancement, Labs, Weight trends, TF tolerance  REASON FOR ASSESSMENT:   Consult Enteral/tube feeding initiation and management  ASSESSMENT:   64 year old female with unremarkable PMH. Pt presented on 11/18/19 with seizures and headaches. Cranial CT revealed SAH. 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 Pt underwent right frontotemporal craniotomy for clipping of internal carotid artery aneurysm as well as evacuation of right temporal hematoma on 12/16/19. Hospital course complicated by dysphagia, and pt was started on Dyshagia 1 diet with thin liquids as well as nasogastric tube feeds for nutritional support. Pt admitted to CIR on 1/16.  01/18 - transition to nocturnal TF 01/19 - rate of nocturnal TF decreased   Per MD note, can consider removing NG tube tomorrow if PO intake is good today. However, meal completion 0% for breakfast this AM. Pt also refused Ensure Enlive this AM but had been accepting them ~50% of the time per [facility].  Pt currently receiving nocturnal tube feeds with Pro-stat BID per tube.  Weight down 3 lbs compared to  admit weight. Will continue to monitor trends.  Meal Completion: 0-75% x last 8 recorded meals  Medications reviewed and include: colace, Ensure Enlive TID, SSI q 4 hours, protonix IVF: NS @ 50 ml/hr q HS  Labs reviewed. CBG's: 98-115 x 24 hours  Diet Order:   Diet Order            DIET - DYS 1 Room service appropriate? Yes; Fluid consistency: Thin  Diet effective now              EDUCATION NEEDS:   No education needs have been identified at this time  Skin:  Skin Assessment: Skin Integrity Issues: Incisions: head  Last BM:  12/06/19 large type 7  Height:   Ht Readings from Last 1 Encounters:  12/02/19 5\' 1"  (1.549 m)    Weight:   Wt Readings from Last 1 Encounters:  12/07/19 52.6 kg    Ideal Body Weight:  47.7 kg  BMI:  Body mass index is 21.92 kg/m.  Estimated Nutritional Needs:   Kcal:  1700-1900  Protein:  85-100 grams  Fluid:  >/= 1.7 L    12/09/19, MS, RD, LDN Inpatient Clinical Dietitian Pager: [phone] Weekend/After Hours: [phone]

## 2019-12-07 NOTE — Progress Notes (Signed)
[place] PHYSICAL MEDICINE & REHABILITATION PROGRESS NOTE  Subjective/Complaints: Lying in bed. Asleep when I arrived. Aroused easily  ROS: limited due to language/communication   Objective: Vital Signs: Blood pressure 123/79, pulse 71, temperature 98.8 F (37.1 C), resp. rate 17, height 5\' 1"  (1.549 m), weight 52.6 kg, SpO2 100 %. DG Chest 2 View  Result Date: 12/05/2019 CLINICAL DATA:  Fever x1 day. EXAM: CHEST - 2 VIEW COMPARISON:  November 21, 2019 FINDINGS: A nasogastric tube is seen with its distal end extending below the level of the diaphragm. This represents a new finding when compared to the prior study. Very mild, hazy atelectasis and/or early infiltrate is seen within the right lung base. There is no evidence of a pleural effusion or pneumothorax. The heart size and mediastinal contours are within normal limits. The visualized skeletal structures are unremarkable. IMPRESSION: 1. Interval nasogastric tube placement and positioning, as described above, when compared to the prior study dated November 21, 2019. 2. Very mild, hazy right basilar atelectasis and/or infiltrate. This also represents a new finding when compared to the prior exam. Electronically Signed   By: Virgina Norfolk M.D.   On: 12/05/2019 18:34   Recent Labs    12/05/19 1641  WBC 7.1  HGB 11.8*  HCT 35.7*  PLT 584*   No results for input(s): NA, K, CL, CO2, GLUCOSE, BUN, CREATININE, CALCIUM in the last 72 hours.  Physical Exam: BP 123/79   Pulse 71   Temp 98.8 F (37.1 C)   Resp 17   Ht 5\' 1"  (1.549 m)   Wt 52.6 kg   SpO2 100%   BMI 21.92 kg/m  Constitutional: No distress . Vital signs reviewed. HEENT: EOMI, oral membranes moist Neck: supple Cardiovascular: RRR without murmur. No JVD    Respiratory: CTA Bilaterally without wheezes or rales. Normal effort    GI: BS +, non-tender, non-distended  Skin: staples along right scalp/forehead Psych: still distracted. restless Musc: No edema in extremities.   No tenderness in extremities. Neurological: Alert and oriented to person and hospital, follows commands. distracte  Right gaze preference, slow to make eye contact  Limited awareness of deficits Decreased pain sense left arm and leg. LUE and LLE 0/5  Assessment/Plan: 1. Functional deficits secondary to subarachnoid hemorrhage/hematoma status post right frontotemporal craniotomy which require 3+ hours per day of interdisciplinary therapy in a comprehensive inpatient rehab setting.  Physiatrist is providing close team supervision and 24 hour management of active medical problems listed below.  Physiatrist and rehab team continue to assess barriers to discharge/monitor patient progress toward functional and medical goals  Care Tool:  Bathing    Body parts bathed by patient: Chest, Abdomen, Front perineal area, Right upper leg, Left upper leg, Face, Left arm, Buttocks   Body parts bathed by helper: Right arm, Right lower leg, Left lower leg     Bathing assist Assist Level: Moderate Assistance - Patient 50 - 74%     Upper Body Dressing/Undressing Upper body dressing   What is the patient wearing?: Pull over shirt    Upper body assist Assist Level: Moderate Assistance - Patient 50 - 74%    Lower Body Dressing/Undressing Lower body dressing      What is the patient wearing?: Incontinence brief, Pants     Lower body assist Assist for lower body dressing: Moderate Assistance - Patient 50 - 74%     Toileting Toileting    Toileting assist Assist for toileting: Maximal Assistance - Patient 25 - 49%  Transfers Chair/bed transfer  Transfers assist     Chair/bed transfer assist level: Moderate Assistance - Patient 50 - 74%     Locomotion Ambulation   Ambulation assist      Assist level: 2 helpers Assistive device: Other (comment)(rail in hallway) Max distance: 15'   Walk 10 feet activity   Assist     Assist level: 2 helpers Assistive device: Other  (comment)(rail in hallway)   Walk 50 feet activity   Assist Walk 50 feet with 2 turns activity did not occur: Safety/medical concerns         Walk 150 feet activity   Assist Walk 150 feet activity did not occur: Safety/medical concerns         Walk 10 feet on uneven surface  activity   Assist Walk 10 feet on uneven surfaces activity did not occur: Safety/medical concerns         Wheelchair     Assist Will patient use wheelchair at discharge?: (TBD)             Wheelchair 50 feet with 2 turns activity    Assist            Wheelchair 150 feet activity     Assist            1.  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  Status post right frontotemporal craniotomy for clipping of internal carotid artery aneurysm as well as evacuation of right temporal hematoma 11/19/2019  Continue CIR PT, OT, SLP   2.  Antithrombotics: -DVT/anticoagulation: Subcutaneous Lovenox initiated 11/22/2019.  Monitor for any bleeding episodes               -antiplatelet therapy: N/A 3. Pain Management: Oxycodone as needed 4. Mood: Xanax 1 mg every 6 hours as needed---limit as possible             -antipsychotic agents: N/A 5. Neuropsych: This patient is not capable of making decisions on her own behalf.  -consider ritalin trial to improve attention and initiation 6. Skin/Wound Care: Routine skin checks 7. Fluids/Electrolytes/Nutrition: Routine in and outs.    -NG feeds decreased at night to help with appetite  -PO intake better yesterday. No breakfast intake recorded tody  -protein for low albumin  -changed IVF to HS only to help with therapies  -consider removing NG tomorrow if po reasonable today 8. Seizure prophylaxis.   Continue Keppra 1000 mg twice daily 9.  Post stroke dysphagia:    -continue Dysphagia #1 thins liquids.   -advance per speech therapy  1/19: reduced TF by half to help promote appetite 10. Hypertension.   Completing course of Nimotop.                Controlled on 1/18  Monitor with increased mobility 11. Hypokalemia  Potassium up to 4.4 1/18 12. Increased LFT's. Likely reactive  -recheck Monday     LOS: 5 days A FACE TO FACE EVALUATION WAS PERFORMED  Ranelle Oyster 12/07/2019, 10:48 AM

## 2019-12-07 NOTE — Progress Notes (Signed)
Speech Language Pathology Daily Session Note  Patient Details  Name: Olivia Mann MRN: [id number] Date of Birth: [birth year]  Today's Date: 12/07/2019 SLP Individual Time: 1300-1345 SLP Individual Time Calculation (min): 45 min  Short Term Goals: Week 1: SLP Short Term Goal 1 (Week 1): Pt will consume therapeutic trials of dys 2 textures with min cues for use of swallowing precautions and minimal overt s/s of aspiration over 3 consecutive sessions prior to advancement. SLP Short Term Goal 2 (Week 1): Pt will locate items at midline during functional tasks with mod assist verbal cues. SLP Short Term Goal 3 (Week 1): Pt will sustain her attention to basic, familiar tasks for 3 minutes with mod verbal cues for redirection. SLP Short Term Goal 4 (Week 1): Pt will complete basic, familiar tasks wtih mod assist for functional problem solving  Skilled Therapeutic Interventions: Skilled treatment session focused on dysphagia and cognitive goals. SLP facilitated session by providing skilled observation with lunch meal Dys. 1 textures with thin liquids. Patient required Max A multimodal cues for problem solving and attention with self-feeding with consistent overt s/s of aspiration due to impulsivity and decreased attention to bolus.  Patient constantly moving about the room in her wheelchair despite Max A multimodal cues for redirection and received multiple telephone calls that required more than a reasonable amount of time to hang up. Overall, patient with increased impulsivity and decreased attention this session. Patient left at RN station with alarm on. Continue with current plan of care.      Pain No/Denies Pain   Therapy/Group: Individual Therapy  Grayce Budden 12/07/2019, 2:54 PM

## 2019-12-07 NOTE — Progress Notes (Signed)
Occupational Therapy Session Note  Patient Details  Name: Ladonne Sharples MRN: [id number] Date of Birth: 09-22-1956  Today's Date: 12/07/2019 OT Individual Time: 1500-1555 OT Individual Time Calculation (min): 55 min    Short Term Goals: Week 1:  OT Short Term Goal 1 (Week 1): Pt will complete BSC transfer with Mod A OT Short Term Goal 2 (Week 1): Pt will complete LB self care with Mod A for dynamic standing balance OT Short Term Goal 3 (Week 1): Pt will utilize head turns towards the Lt to locate 2 items needed during ADL session with mod cuing  Skilled Therapeutic Interventions/Progress Updates:    1:1. Pt declined pain. ABS of 27 this session with pt extremely impulsive, restless and becoming agitated by end of session. Pt received in bed agreeable to tx and Ot assists pt to bathroom. Pt requires MAX A throughout session with OT interrupting pts impulsive movements to initate transfers by herself 3x throughout session. Pt requries MAX A for transfer and standing balance during clothing management with L knee block. Pt requires max VC for scanning to midline to locate hand soap/paper towels for hand hygiene. Pt completes seated dynavision with stimuli limited to R lower quadrant requiring max VC for attention to activity and multimodal cues to scan to midline requiring average of 9 seconds to locate stimuli. Attempted to have pt sort silverware into organizer seated at [facility], however pt unable to attend to sorting task. Utilized yellow anchor strip at midline to have pt visually scan to silverware on table and place into any organizer pocket on R. After all silverware in container, then pt spontaneously sorts with 1 error. Exited session with pt seated in bed, exit alarm on, 4 bed rails up and floor pads in place.   Therapy Documentation Precautions:  Precautions Precautions: Fall Precaution Comments: L neglect, L hemiparesis Restrictions Weight Bearing Restrictions: No General:   Vital  Signs: Therapy Vitals Temp: 98.7 F (37.1 C) Pulse Rate: 81 Resp: 17 BP: 105/65 Patient Position (if appropriate): Lying Oxygen Therapy SpO2: 100 % O2 Device: Room Air Pain:   ADL: ADL Eating: Not assessed Grooming: Maximal assistance Where Assessed-Grooming: Edge of bed Upper Body Bathing: Moderate assistance Where Assessed-Upper Body Bathing: Edge of bed Lower Body Bathing: Maximal assistance Where Assessed-Lower Body Bathing: Edge of bed Upper Body Dressing: Maximal assistance Where Assessed-Upper Body Dressing: Edge of bed Lower Body Dressing: Maximal assistance Where Assessed-Lower Body Dressing: Edge of bed Toileting: Not assessed Toilet Transfer: Maximal assistance Toilet Transfer Method: Surveyor, minerals: Gaffer: Not assessed Vision   Perception    Praxis   Exercises:   Other Treatments:     Therapy/Group: Individual Therapy  [name] 12/07/2019, 4:02 PM

## 2019-12-07 NOTE — Progress Notes (Signed)
Physical Therapy Session Note  Patient Details  Name: Nakyla Bracco MRN: [id number] Date of Birth: 12/29/1955  Today's Date: 12/07/2019 PT Individual Time: 1350-1425 PT Individual Time Calculation (min): 35 min   Short Term Goals: Week 1:  PT Short Term Goal 1 (Week 1): Pt will perform supine<>sit with min assist PT Short Term Goal 2 (Week 1): Pt will perform sit<>stand with mod assist PT Short Term Goal 3 (Week 1): Pt will perform bed<>chair transfers with mod assist  Skilled Therapeutic Interventions/Progress Updates:   Attempted to see pt at scheduled therapy time in AM. Pt sleeping heavily and only minimally opened eyes in response to therapist's tactile and verbal cues to wake up. Returned in PM to make-up time w/ pt. Pt received in w/c at nurses station and agreeable to therapy, denies pain. Taken to day room and provided w/ remainder of her lunch. Pt took 4-5 bites of magic cup, w/ frequent coughing and verbal cues to fully clear throat before attempting another bite. Deferred further attempts 2/2 coughing and decreased attention to task. Pt very minimally engaging w/ therapist this session. Attempted multiple tasks including kinetron, pt did not meaningfully engage, and then peg board and card matching tasks. Peg board and card matching emphasized visual scanning, reaching to midline, and attention to task. Pt in minimally distracting environment and unable to performed either task successfully despite max verbal, tactile, and hand-over-hand cues. Pt requesting to return to bed. Total assist w/c transport back to room and pt impulsively trying to get up from chair before w/c had reached bed, foot rests were off, and seat belt was remove. Max cues to slow down and wait for safety. Mod assist stand pivot to EOB and sit>supine. Ended session in supine, all needs in reach. Missed 25 min of skilled PT in total today 2/2 decreased participation and fatigue.   Therapy Documentation Precautions:   Precautions Precautions: Fall Precaution Comments: L neglect, L hemiparesis Restrictions Weight Bearing Restrictions: No Vital Signs: Therapy Vitals Temp: 98.7 F (37.1 C) Pulse Rate: 81 Resp: 17 BP: 105/65 Patient Position (if appropriate): Lying Oxygen Therapy SpO2: 100 % O2 Device: Room Air  Therapy/Group: Individual Therapy  Gurfateh Mcclain Melton Krebs 12/07/2019, 5:29 PM

## 2019-12-08 ENCOUNTER — Inpatient Hospital Stay (HOSPITAL_COMMUNITY): Payer: Self-pay | Admitting: Speech Pathology

## 2019-12-08 ENCOUNTER — Inpatient Hospital Stay (HOSPITAL_COMMUNITY): Payer: Self-pay | Admitting: Physical Therapy

## 2019-12-08 ENCOUNTER — Inpatient Hospital Stay (HOSPITAL_COMMUNITY): Payer: Self-pay

## 2019-12-08 LAB — GLUCOSE, CAPILLARY
Glucose-Capillary: 103 mg/dL — ABNORMAL HIGH (ref 70–99)
Glucose-Capillary: 113 mg/dL — ABNORMAL HIGH (ref 70–99)
Glucose-Capillary: 129 mg/dL — ABNORMAL HIGH (ref 70–99)
Glucose-Capillary: 82 mg/dL (ref 70–99)
Glucose-Capillary: 87 mg/dL (ref 70–99)
Glucose-Capillary: 96 mg/dL (ref 70–99)
Glucose-Capillary: 98 mg/dL (ref 70–99)

## 2019-12-08 NOTE — Progress Notes (Signed)
Occupational Therapy Session Note  Patient Details  Name: Olivia Mann MRN: [id number] Date of Birth: [birth year]  Today's Date: 12/08/2019 OT Individual Time: 4098-1191 OT Individual Time Calculation (min): 75 min    Short Term Goals: Week 1:  OT Short Term Goal 1 (Week 1): Pt will complete BSC transfer with Mod A OT Short Term Goal 2 (Week 1): Pt will complete LB self care with Mod A for dynamic standing balance OT Short Term Goal 3 (Week 1): Pt will utilize head turns towards the Lt to locate 2 items needed during ADL session with mod cuing  Skilled Therapeutic Interventions/Progress Updates:    1:1. Pt received in bed agreeable to OT with no pain reported. Pt completes stand pivot transfers with MIN A for power up but MAX A for pivot/foot placement to w/c and toilet. tp Requires MAX A for toileting overall to attend to CM on L side. Pt bathes and dresses with all ADL items at midline or L on sink with total cuing to locate items and head turn. Pt completes UB dressing with MOD A and A to initatie hemi techqniues and LB dressing with MAX A for holding LLE into figure 4. Pt brushes teeth with HOH A to use LUE in bimanual task. Pt scans on table top to locate kitchen items with 2/6 accuracy and MAX VC for head turns. Daughter present at end of session and educated on nursing assiting pt back to bed, subluxation and tx for LUE shoulder sublux. Pt left in w/c with daughter seated on L and call light in reach   Therapy Documentation Precautions:  Precautions Precautions: Fall Precaution Comments: L neglect, L hemiparesis Restrictions Weight Bearing Restrictions: No General:   Vital Signs:  Pain: Pain Assessment Pain Scale: Faces Faces Pain Scale: Hurts little more Pain Type: Acute pain Pain Location: Head Pain Descriptors / Indicators: Headache Pain Onset: On-going Pain Intervention(s): Repositioned;Refused;Emotional support(offered pain med. Stated "I'm tired of pain  med") ADL: ADL Eating: Not assessed Grooming: Maximal assistance Where Assessed-Grooming: Edge of bed Upper Body Bathing: Moderate assistance Where Assessed-Upper Body Bathing: Edge of bed Lower Body Bathing: Maximal assistance Where Assessed-Lower Body Bathing: Edge of bed Upper Body Dressing: Maximal assistance Where Assessed-Upper Body Dressing: Edge of bed Lower Body Dressing: Maximal assistance Where Assessed-Lower Body Dressing: Edge of bed Toileting: Not assessed Toilet Transfer: Maximal assistance Toilet Transfer Method: Surveyor, minerals: Gaffer: Not assessed Vision   Perception    Praxis   Exercises:   Other Treatments:     Therapy/Group: Individual Therapy  [name] 12/08/2019, 10:47 AM

## 2019-12-08 NOTE — Progress Notes (Signed)
Physical Therapy Weekly Progress Note  Patient Details  Name: Olivia Mann MRN: [id number] Date of Birth: 03/28/1956  Beginning of progress report period: December 02, 2019 End of progress report period: December 08, 2019  Today's Date: 12/08/2019 PT Individual Time: 1300-1355 PT Individual Time Calculation (min): 55 min   Patient has met 2 of 3 short term goals. Pt has made steady progress towards LTGs over last week. She is performing bed mobility and transfers w/ mod assist, sit<>stands w/ min assist, and ambulating w/ mod-max assist x20-30'. She continues to require max multimodal cues to attend to R environment, or to even reach visual midline, and to attend to task. She is limited by poor participation, lethargy, and fatigue at times, however is able to verbalize that she wants to improve her functional level and continue working hard w/ therapy. Though she dense L hemi remains, pt w/ palpable quad and hip flexor activation felt during gait and transfers.   Patient continues to demonstrate the following deficits muscle weakness, decreased cardiorespiratoy endurance, impaired timing and sequencing, unbalanced muscle activation, decreased coordination and decreased motor planning, decreased visual perceptual skills, decreased midline orientation and decreased attention to left, decreased initiation, decreased attention, decreased awareness, decreased problem solving, decreased safety awareness, decreased memory and delayed processing and decreased sitting balance, decreased standing balance, decreased postural control, hemiplegia and decreased balance strategies and therefore will continue to benefit from skilled PT intervention to increase functional independence with mobility.  Patient progressing toward long term goals..  Continue plan of care.  PT Short Term Goals Week 1:  PT Short Term Goal 1 (Week 1): Pt will perform supine<>sit with min assist PT Short Term Goal 1 - Progress (Week 1):  Progressing toward goal PT Short Term Goal 2 (Week 1): Pt will perform sit<>stand with mod assist PT Short Term Goal 2 - Progress (Week 1): Met PT Short Term Goal 3 (Week 1): Pt will perform bed<>chair transfers with mod assist PT Short Term Goal 3 - Progress (Week 1): Met Week 2:  PT Short Term Goal 1 (Week 2): Pt will self-propel w/c 50' w/ min assist PT Short Term Goal 2 (Week 2): Pt will attend to visual midline during functional mobility w/ mod cues PT Short Term Goal 3 (Week 2): Pt will ambulate 5' w/ mod assist w/ LRAD  Skilled Therapeutic Interventions/Progress Updates:   Pt in supine and agreeable to therapy, no c/o pain. Pt's daughter, Olivia Mann, present throughout session. Supine>sit w/ mod assist. Maintained static sitting balance w/ CGA-close supervision for 20 min while eating lunch. Pt in minimally distracting environment and needed mod-max cues to attend to task, to scan to middle of lunch tray, and to take smaller bites. Pt unable to reach midline to reach for spoon ~50% of the time w/o hand-over-hand assist to bring her hand to the spoon. Despite this, she was able to verbalize to therapist that she couldn't find what she was looking for and recognized that the targeted item was outside of her visual/perceptual field. Stand pivot to w/c w/ mod assist and total assist w/c transport to therapy gym. Worked on gait training at rail and ambulated 15' w/ min assist for upright balance and posture, max assist for LLE management including step placement and to manage L knee buckling/hyperextension. L DF ACE wrap donned to allow therapist to focus on knee management. Pt reported she needed to urgently toilet. Returned to room and performed toilet transfer w/ mod-max assist. Total assist for LE garment management and  pericare, pt continent of bowel and bladder. Ended session under direct supervision of NT at nurses station, all needs met and pt in w/c w/ safely alarm belt activated.   Therapy  Documentation Precautions:  Precautions Precautions: Fall Precaution Comments: L neglect, L hemiparesis Restrictions Weight Bearing Restrictions: No Vital Signs: Therapy Vitals Temp: 98.7 F (37.1 C) Pulse Rate: 94 Resp: 18 BP: 121/85 Patient Position (if appropriate): Lying Oxygen Therapy SpO2: 100 % O2 Device: Room Air  Therapy/Group: Individual Therapy  Kita Neace Clent Demark 12/08/2019, 5:17 PM

## 2019-12-08 NOTE — Progress Notes (Signed)
Speech Language Pathology Weekly Progress and Session Note  Patient Details  Name: Olivia Mann MRN: [id number] Date of Birth: 08-16-1956  Beginning of progress report period: December 01, 2019 End of progress report period: December 08, 2019  Today's Date: 12/08/2019 SLP Individual Time: 9678-9381 SLP Individual Time Calculation (min): 45 min  Short Term Goals: Week 1: SLP Short Term Goal 1 (Week 1): Pt will consume therapeutic trials of dys 2 textures with min cues for use of swallowing precautions and minimal overt s/s of aspiration over 3 consecutive sessions prior to advancement. SLP Short Term Goal 1 - Progress (Week 1): Not met SLP Short Term Goal 2 (Week 1): Pt will locate items at midline during functional tasks with mod assist verbal cues. SLP Short Term Goal 2 - Progress (Week 1): Not met SLP Short Term Goal 3 (Week 1): Pt will sustain her attention to basic, familiar tasks for 3 minutes with mod verbal cues for redirection. SLP Short Term Goal 3 - Progress (Week 1): Not met SLP Short Term Goal 4 (Week 1): Pt will complete basic, familiar tasks wtih mod assist for functional problem solving SLP Short Term Goal 4 - Progress (Week 1): Not met    New Short Term Goals: Week 2: SLP Short Term Goal 1 (Week 2): Pt will consume therapeutic trials of dys 2 textures with min cues for use of swallowing precautions and minimal overt s/s of aspiration over 3 consecutive sessions prior to advancement. SLP Short Term Goal 2 (Week 2): Pt will locate items at midline during functional tasks with Max assist verbal and visual cues in 25% of opportunities. SLP Short Term Goal 3 (Week 2): Pt will sustain her attention to basic, familiar tasks for 3 minutes with mod verbal cues for redirection. SLP Short Term Goal 4 (Week 2): Pt will complete basic, familiar tasks wtih mod assist for functional problem solving  Weekly Progress Updates: Patient has made minimal gains and has not met any STGs this  reporting period. Currently, patient is consuming Dys. 1 textures with thin liquids with inconsistent overt s/s of aspiration which appears related to attention and impulsivity with task. Due to safety concerns, trials of Dys. 2 textures have not been attempted. Patient also demonstrates a severe left inattention with Max A multimodal cues required to scan towards left field of environment with minimal success with scanning to midline. Patient also requires overall Max-Total A multimodal cues for functional problem solving, emergent awareness, attention and recall with intermittent confusion.  Patient and family education ongoing. Patient would benefit from continued skilled SLP intervention to maximize her swallowing and cognitive function prior to discharge.      Intensity: Minumum of 1-2 x/day, 30 to 90 minutes Frequency: 3 to 5 out of 7 days Duration/Length of Stay: 12/27/19 Treatment/Interventions: Cognitive remediation/compensation;Cueing hierarchy;Functional tasks;Patient/family education;Internal/external aids;Dysphagia/aspiration precaution training;Environmental controls;Therapeutic Activities   Daily Session  Skilled Therapeutic Interventions: Skilled treatment session focused on cognitive and dysphagia goals. Upon arrival, patient was sitting upright with her legs over the side rails.  SLP facilitated session by providing Max A verbal and tactile cues for problem solving with transfer due to decreased attention and overall impulsivity. Patient transferred to a table to maximize safety and overall function with PO intake. Patient consumed breakfast meal of Dys. 1 textures with thin liquids with Mod A verbal cues for use of small bites/sips and overall Min A verbal cues for sustained attention to bolus.  Patient with intermittent overt s/s of aspiration that were essentially  eliminated when patient was cued to keep her head up in a neutral position. Patient was oriented to place but continues to  require total A for emergent awareness of deficits and their impact on her safety. Patient transferred back to bed at end of session. Patient left supine in bed with alarm on and all needs within reach. Continue with current plan of care.    Pain No/Denies Pain   Therapy/Group: Individual Therapy  Farzad Tibbetts 12/08/2019, 6:25 AM

## 2019-12-08 NOTE — Progress Notes (Signed)
[place] PHYSICAL MEDICINE & REHABILITATION PROGRESS NOTE  Subjective/Complaints: Lying in bed. "I need to go home. Have had two deaths in family"  ROS: limited d/t cognition   Objective: Vital Signs: Blood pressure (!) 135/96, pulse 99, temperature 98.5 F (36.9 C), temperature source Oral, resp. rate 18, height 5\' 1"  (1.549 m), weight 52.6 kg, SpO2 100 %. No results found. Recent Labs    12/05/19 1641  WBC 7.1  HGB 11.8*  HCT 35.7*  PLT 584*   No results for input(s): NA, K, CL, CO2, GLUCOSE, BUN, CREATININE, CALCIUM in the last 72 hours.  Physical Exam: BP (!) 135/96   Pulse 99   Temp 98.5 F (36.9 C) (Oral)   Resp 18   Ht 5\' 1"  (1.549 m)   Wt 52.6 kg   SpO2 100%   BMI 21.92 kg/m  Constitutional: No distress . Vital signs reviewed. HEENT: EOMI, oral membranes moist Neck: supple Cardiovascular: RRR without murmur. No JVD    Respiratory: CTA Bilaterally without wheezes or rales. Normal effort    GI: BS +, non-tender, non-distended  Skin: staples along right scalp/forehead Psych: still distracted. restless Musc: No edema in extremities.  No tenderness in extremities. Neurological: alert, oriented to person. Thinks she's in [place]. Wants to know what time train is coming in. Right gaze preference, slow to make eye contact  Limited awareness of deficits Decreased pain sense left arm and leg. LUE and LLE 0/5--no volitional movement. No resting tone  Assessment/Plan: 1. Functional deficits secondary to subarachnoid hemorrhage/hematoma status post right frontotemporal craniotomy which require 3+ hours per day of interdisciplinary therapy in a comprehensive inpatient rehab setting.  Physiatrist is providing close team supervision and 24 hour management of active medical problems listed below.  Physiatrist and rehab team continue to assess barriers to discharge/monitor patient progress toward functional and medical goals  Care Tool:  Bathing    Body parts bathed  by patient: Chest, Abdomen, Front perineal area, Right upper leg, Left upper leg, Face, Left arm, Buttocks   Body parts bathed by helper: Right arm, Right lower leg, Left lower leg     Bathing assist Assist Level: Moderate Assistance - Patient 50 - 74%     Upper Body Dressing/Undressing Upper body dressing   What is the patient wearing?: Pull over shirt    Upper body assist Assist Level: Moderate Assistance - Patient 50 - 74%    Lower Body Dressing/Undressing Lower body dressing      What is the patient wearing?: Incontinence brief, Pants     Lower body assist Assist for lower body dressing: Moderate Assistance - Patient 50 - 74%     Toileting Toileting    Toileting assist Assist for toileting: Maximal Assistance - Patient 25 - 49%     Transfers Chair/bed transfer  Transfers assist     Chair/bed transfer assist level: Moderate Assistance - Patient 50 - 74%     Locomotion Ambulation   Ambulation assist      Assist level: Moderate Assistance - Patient 50 - 74% Assistive device: Parallel bars Max distance: 20   Walk 10 feet activity   Assist     Assist level: Moderate Assistance - Patient - 50 - 74% Assistive device: Parallel bars   Walk 50 feet activity   Assist Walk 50 feet with 2 turns activity did not occur: Safety/medical concerns         Walk 150 feet activity   Assist Walk 150 feet activity did not occur:  Safety/medical concerns         Walk 10 feet on uneven surface  activity   Assist Walk 10 feet on uneven surfaces activity did not occur: Safety/medical concerns         Wheelchair     Assist Will patient use wheelchair at discharge?: (TBD)             Wheelchair 50 feet with 2 turns activity    Assist            Wheelchair 150 feet activity     Assist            1.  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  Status post right frontotemporal craniotomy for  clipping of internal carotid artery aneurysm as well as evacuation of right temporal hematoma 11/19/2019  Continue CIR PT, OT, SLP   2.  Antithrombotics: -DVT/anticoagulation: Subcutaneous Lovenox initiated 11/22/2019.  Monitor for any bleeding episodes               -antiplatelet therapy: N/A 3. Pain Management: Oxycodone as needed 4. Mood: Xanax 1 mg every 6 hours as needed---limit as possible             -antipsychotic agents: N/A 5. Neuropsych: This patient is not capable of making decisions on her own behalf.  -consider ritalin trial to improve attention and initiation 6. Skin/Wound Care: Routine skin checks 7. Fluids/Electrolytes/Nutrition: Routine in and outs.    -NG feeds decreased at night to help with appetite  -PO inconsistent. Ate 100% dinner but not much else yesterday, cog component  -protein for low albumin  -changed IVF to HS only to help with therapies  -continue NGT for now 8. Seizure prophylaxis.   Continue Keppra 1000 mg twice daily 9.  Post stroke dysphagia:    -continue Dysphagia #1 thins liquids.   -advance per speech therapy  1/19: reduced TF by half to help promote appetite 10. Hypertension.  Completing course of Nimotop.                fair control 1/22--no changes today    11. Hypokalemia  Potassium up to 4.4 1/18 12. Increased LFT's. Likely reactive  -recheck Monday     LOS: 6 days A FACE TO FACE EVALUATION WAS PERFORMED  Meredith Staggers 12/08/2019, 11:04 AM

## 2019-12-09 ENCOUNTER — Inpatient Hospital Stay (HOSPITAL_COMMUNITY): Payer: Self-pay | Admitting: Physical Therapy

## 2019-12-09 ENCOUNTER — Inpatient Hospital Stay (HOSPITAL_COMMUNITY): Payer: Self-pay

## 2019-12-09 LAB — GLUCOSE, CAPILLARY
Glucose-Capillary: 101 mg/dL — ABNORMAL HIGH (ref 70–99)
Glucose-Capillary: 71 mg/dL (ref 70–99)
Glucose-Capillary: 93 mg/dL (ref 70–99)
Glucose-Capillary: 94 mg/dL (ref 70–99)
Glucose-Capillary: 85 mg/dL (ref 70–99)

## 2019-12-09 MED ORDER — FREE WATER
200.0000 mL | Freq: Three times a day (TID) | Status: DC
  Administered 2019-12-09 – 2019-12-11 (×6): 200 mL

## 2019-12-09 MED ORDER — MEGESTROL ACETATE 400 MG/10ML PO SUSP
400.0000 mg | Freq: Two times a day (BID) | ORAL | Status: DC
  Administered 2019-12-09 – 2019-12-19 (×21): 400 mg via ORAL
  Filled 2019-12-09 (×21): qty 10

## 2019-12-09 NOTE — Progress Notes (Signed)
Social Work Patient ID: Olivia Mann, female   DOB: 12-18-1955, 64 y.o.   MRN: [id number]   Pt/ family aware of targeted d/c date 2/10 and minimal assist goals overall.  No concerns at this time.  Jaxsen Bernhart, LCSW

## 2019-12-09 NOTE — Progress Notes (Addendum)
Occupational Therapy Session Note  Patient Details  Name: Olivia Mann MRN: [id number] Date of Birth: 19-Mar-1956  Today's Date: 12/09/2019 OT Individual Time: 1400-1500 OT Individual Time Calculation (min): 60 min    Short Term Goals: Week 1:  OT Short Term Goal 1 (Week 1): Pt will complete BSC transfer with Mod A OT Short Term Goal 2 (Week 1): Pt will complete LB self care with Mod A for dynamic standing balance OT Short Term Goal 3 (Week 1): Pt will utilize head turns towards the Lt to locate 2 items needed during ADL session with mod cuing  Skilled Therapeutic Interventions/Progress Updates:    1;1. Pt received in bed agreeable to OT with no pain reported. Pt completes stand pivot transfer with MOD A to R. Pt requries A to manage LUE/LE. Pt completes bathing seated at sink for UB with HOH A to wash RUE with LUE for NMR. Pt completes standing peri/buttock bathing with MAX A for standing balance and max education to complete activity at sink as opposed to seated on toilet as not enough room for OT to block L knee from buckling in toilet. Pt perseverating on this set up for a while, but evenutally redirected. Pt dresses with MAX A for LB dressing. [place] estim applied to L deltoid for shoulder aproximation d/t subluxation. Pt able to feel sensation and skin in tact after of stimulation without redness/irritation. Pt completes horse shoe activity seated scanning L with max VC for locating horseshoe. Exited session with pt setaed in bed, exi talarm on and call light inr each  Attended E stim-skin in tact after 20 min stimulation-no adverse reactions [place] Stim One 330 pulse width 35 Hz pulse rate On 8 sec/ off 8 sec Ramp up/ down 2 sec Symmetrical Biphasic wave form  Max intensity at 500 Ohm load  Therapy Documentation Precautions:  Precautions Precautions: Fall Precaution Comments: L neglect, L hemiparesis Restrictions Weight Bearing Restrictions: No General:   Vital  Signs: Therapy Vitals Temp: 98.3 F (36.8 C) Temp Source: Oral Pulse Rate: 77 Resp: 18 BP: 129/78 Patient Position (if appropriate): Lying Oxygen Therapy SpO2: 100 % O2 Device: Room Air Pain:   ADL: ADL Eating: Not assessed Grooming: Maximal assistance Where Assessed-Grooming: Edge of bed Upper Body Bathing: Moderate assistance Where Assessed-Upper Body Bathing: Edge of bed Lower Body Bathing: Maximal assistance Where Assessed-Lower Body Bathing: Edge of bed Upper Body Dressing: Maximal assistance Where Assessed-Upper Body Dressing: Edge of bed Lower Body Dressing: Maximal assistance Where Assessed-Lower Body Dressing: Edge of bed Toileting: Not assessed Toilet Transfer: Maximal assistance Toilet Transfer Method: Surveyor, minerals: Gaffer: Not assessed Vision   Perception    Praxis   Exercises:   Other Treatments:     Therapy/Group: Individual Therapy  [name] 12/09/2019, 2:59 PM

## 2019-12-09 NOTE — Progress Notes (Signed)
Social Work Assessment and Plan   Patient Details  Name: Olivia Mann MRN: [id number] Date of Birth: 1956/04/19  Today's Date: 12/05/2019  Problem List:  Patient Active Problem List   Diagnosis Date Noted  . Hypokalemia   . Dysphagia, post-stroke   . Seizure prophylaxis   . Hemiparesis affecting left side as late effect of stroke (HCC)   . SAH (subarachnoid hemorrhage) (HCC)   . Tachypnea   . Tachycardia   . Hypertension   . Leukocytosis   . Cytotoxic brain edema (HCC) 11/20/2019  . Seizures (HCC) 11/20/2019  . Subarachnoid hemorrhage (HCC)   . Endotracheally intubated   . Macrocytosis without anemia   . Encephalopathy acute   . Subarachnoid hematoma (HCC) 11/18/2019   Past Medical History: History reviewed. No pertinent past medical history. Past Surgical History:  Past Surgical History:  Procedure Laterality Date  . CRANIOTOMY Right 11/19/2019   Procedure: CRANIOTOMY CLIPPING OF CAROTID ANEURYSM;  Surgeon: Lisbeth Renshaw, MD;  Location: [facility] OR;  Service: Neurosurgery;  Laterality: Right;   Social History:  reports that she has been smoking cigarettes. She has been smoking about 1.00 pack per day. She does not have any smokeless tobacco history on file. No history on file for alcohol and drug.  Family / Support Systems Marital Status: Separated How Long?: 25+ years per daughters Patient Roles: Parent Children: daughter, Donavan Foil [facility]) @ [phone];  daughter, Tessie Fass (pt lives with in [place], [place]) @ [phone];  son, Zenaida Niece [phone]) # [phone] Other Supports: Children aware that pt's spouse listed as contact - they note that he may be part of caregiver group "if mom will let him" Anticipated Caregiver: Bethann Berkshire and Marquise Ability/Limitations of Caregiver: Min A/Mod A Caregiver Availability: 24/7 Family Dynamics: Adult children all very support and committed to providing 24/7 support at d/c.  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  She is oriented to person and able to answer basic questions.  Appears very fatigued and having difficulty with attention and alertness.  Daughters provide most information.  Will likely involve neuropsychology during her CIR stay. Recent Psychosocial Issues: None Psychiatric History: None Substance Abuse History: None  Patient / Family Perceptions, Expectations & Goals Pt/Family understanding of illness & functional limitations: Family appears to have a good understanding of pt's hemorrhage and current functional limitations.  Education to be ongoing. Premorbid pt/family roles/activities: Pt was completely independent and working PTA. Anticipated changes in roles/activities/participation: Adult children, with Tricha to be primary, providing caregiver support. Pt/family expectations/goals: "we just hope she can get some level of independence back but we will do whatever is needed."  [employer]: None Premorbid Home Care/DME Agencies: None Transportation available at discharge: yes Resource referrals recommended: Neuropsychology  Discharge Planning Living Arrangements: Children Support Systems: Children Type of Residence: Private residence Insurance Resources: [employer] (specify county) Surveyor, quantity Resources: [employer] Financial Screen Referred:  Previously completed Living Expenses: Lives with family Money Management: Patient Does the patient have any problems obtaining your medications?:  No Home Management: pt and daughter Patient/Family Preliminary Plans: Pt to return to Trisha's home in [place] Work Anticipated Follow Up Needs: HH/OP Expected length of stay: 3.5 weeks  Clinical Impression Unfortunate woman here following a rupture with brain hemorrhage and significant impairments.  Excellent support from adult children with plan for pt to return home to [place] at d/c.  No significant emotional distress noted, however, plan to refer for neuropsychology with improved alertness.  Avis Mcmahill 12/05/2019, 3:48 PM

## 2019-12-09 NOTE — Progress Notes (Addendum)
Pt restless in evening. TS in place with low bed and floor mats. Required 1:1 monitoring until around 2300 due to throwing her legs over the bed and trying to get up. Transferred to wheelchair to sit at front nurses station for a couple hours. Drank about half of a strawberry ensure.  Despite being unhappy about taking more meds at night, did not disturb tube feeding- started at 8:30pm with no issues at 82ml/hr.  Will continue to monitor for safety.

## 2019-12-09 NOTE — Progress Notes (Signed)
[facility] PHYSICAL MEDICINE & REHABILITATION PROGRESS NOTE  Subjective/Complaints: Up in bed. No new complaints. Still wants to go home  ROS: Limited due to cognitive/behavioral    Objective: Vital Signs: Blood pressure 119/73, pulse 78, temperature 98.3 F (36.8 C), resp. rate 18, height 5\' 1"  (1.549 m), weight 52.2 kg, SpO2 96 %. No results found. No results for input(s): WBC, HGB, HCT, PLT in the last 72 hours. No results for input(s): NA, K, CL, CO2, GLUCOSE, BUN, CREATININE, CALCIUM in the last 72 hours.  Physical Exam: BP 119/73 (BP Location: Right Arm)   Pulse 78   Temp 98.3 F (36.8 C)   Resp 18   Ht 5\' 1"  (1.549 m)   Wt 52.2 kg   SpO2 96%   BMI 21.73 kg/m  Constitutional: No distress . Vital signs reviewed. HEENT: EOMI, oral membranes moist Neck: supple Cardiovascular: RRR without murmur. No JVD    Respiratory: CTA Bilaterally without wheezes or rales. Normal effort    GI: BS +, non-tender, non-distended  Skin: staples along right scalp/forehead Psych: flat, distractable Musc: No edema in extremities.  No tenderness in extremities. Neurological: oriented to person. Follows simple commands Limited awareness of deficits Decreased pain sense left arm and leg. LUE and LLE 0/5--no volitional movement. No resting tone  Assessment/Plan: 1. Functional deficits secondary to subarachnoid hemorrhage/hematoma status post right frontotemporal craniotomy which require 3+ hours per day of interdisciplinary therapy in a comprehensive inpatient rehab setting.  Physiatrist is providing close team supervision and 24 hour management of active medical problems listed below.  Physiatrist and rehab team continue to assess barriers to discharge/monitor patient progress toward functional and medical goals  Care Tool:  Bathing    Body parts bathed by patient: Chest, Abdomen, Front perineal area, Right upper leg, Left upper leg, Face, Left arm, Buttocks   Body parts bathed by  helper: Right arm, Right lower leg, Left lower leg     Bathing assist Assist Level: Moderate Assistance - Patient 50 - 74%     Upper Body Dressing/Undressing Upper body dressing   What is the patient wearing?: Pull over shirt    Upper body assist Assist Level: Moderate Assistance - Patient 50 - 74%    Lower Body Dressing/Undressing Lower body dressing      What is the patient wearing?: Incontinence brief, Pants     Lower body assist Assist for lower body dressing: Moderate Assistance - Patient 50 - 74%     Toileting Toileting    Toileting assist Assist for toileting: Maximal Assistance - Patient 25 - 49%     Transfers Chair/bed transfer  Transfers assist     Chair/bed transfer assist level: Minimal Assistance - Patient > 75%     Locomotion Ambulation   Ambulation assist      Assist level: Maximal Assistance - Patient 25 - 49% Assistive device: Other (comment)(rail in hallway) Max distance: 15'   Walk 10 feet activity   Assist     Assist level: Maximal Assistance - Patient 25 - 49% Assistive device: Parallel bars   Walk 50 feet activity   Assist Walk 50 feet with 2 turns activity did not occur: Safety/medical concerns         Walk 150 feet activity   Assist Walk 150 feet activity did not occur: Safety/medical concerns         Walk 10 feet on uneven surface  activity   Assist Walk 10 feet on uneven surfaces activity did not occur: Safety/medical  concerns         Wheelchair     Assist Will patient use wheelchair at discharge?: (TBD) Type of Wheelchair: Manual    Wheelchair assist level: Minimal Assistance - Patient > 75% Max wheelchair distance: 100'    Wheelchair 50 feet with 2 turns activity    Assist        Assist Level: Minimal Assistance - Patient > 75%   Wheelchair 150 feet activity     Assist            1.  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  Status post  right frontotemporal craniotomy for clipping of internal carotid artery aneurysm as well as evacuation of right temporal hematoma 11/19/2019  Continue CIR PT, OT, SLP   2.  Antithrombotics: -DVT/anticoagulation: Subcutaneous Lovenox initiated 11/22/2019.  Monitor for any bleeding episodes               -antiplatelet therapy: N/A 3. Pain Management: Oxycodone as needed 4. Mood: Xanax 1 mg every 6 hours as needed---limit as possible             -antipsychotic agents: N/A 5. Neuropsych: This patient is not capable of making decisions on her own behalf.  -consider ritalin trial to improve attention and initiation 6. Skin/Wound Care: Routine skin checks 7. Fluids/Electrolytes/Nutrition: Routine in and outs.    -NG feeds decreased at night to help with appetite   -protein for low albumin  -changed IVF to HS only to help with therapies  -continue NGT for now as she just isn't eating consistently enough  -add megace today 8. Seizure prophylaxis.   Continue Keppra 1000 mg twice daily 9.  Post stroke dysphagia:    -continue Dysphagia #1 thins liquids.   -advance per speech therapy  1/19: reduced TF by half to help promote appetite 10. Hypertension.  Completing course of Nimotop.                good control 1/23--no changes today    11. Hypokalemia  Potassium up to 4.4 1/18 12. Increased LFT's. Likely reactive  -recheck Monday     LOS: 7 days A FACE TO FACE EVALUATION WAS PERFORMED  Meredith Staggers 12/09/2019, 12:46 PM

## 2019-12-09 NOTE — Care Management (Signed)
[facility] Individual Statement of Services  Patient Name:  Olivia Mann  Date:  12/05/2019  Welcome to the [facility].  Our goal is to provide you with an individualized program based on your diagnosis and situation, designed to meet your specific needs.  With this comprehensive rehabilitation program, you will be expected to participate in at least 3 hours of rehabilitation therapies Monday-Friday, with modified therapy programming on the weekends.  Your rehabilitation program will include the following services:  Physical Therapy (PT), Occupational Therapy (OT), Speech Therapy (ST), 24 hour per day rehabilitation nursing, Therapeutic Recreaction (TR), Neuropsychology, Case Management (Social Worker), Rehabilitation Medicine, Nutrition Services and Pharmacy Services  Weekly team conferences will be held on Tuesdays to discuss your progress.  Your Social Worker will talk with you frequently to get your input and to update you on team discussions.  Team conferences with you and your family in attendance may also be held.  Expected length of stay: 3.5 weeks   Overall anticipated outcome: minimal assistance  Depending on your progress and recovery, your program may change. Your Social Worker will coordinate services and will keep you informed of any changes. Your Social Worker's name and contact numbers are listed  below.  The following services may also be recommended but are not provided by the In[facility]:   Driving Evaluations  Home Health Rehabiltiation Services  Outpatient Rehabilitation Services  Vocational Rehabilitation   Arrangements will be made to provide these services after discharge if needed.  Arrangements include referral to agencies that provide these services.  Your insurance has been verified to be:  [place] Your primary doctor is:    Pertinent information will be shared with your doctor and your  insurance company.  Social Worker:  [place], [place] [phone] or [phone]   Information discussed with and copy given to patient by: Amada Jupiter, 12/05/2019, 3:50 PM

## 2019-12-09 NOTE — Progress Notes (Signed)
Physical Therapy Session Note  Patient Details  Name: Olivia Mann MRN: [id number] Date of Birth: May 06, 1956  Today's Date: 12/09/2019 PT Individual Time: 0900-0940 PT Individual Time Calculation (min): 40 min   Short Term Goals: Week 2:  PT Short Term Goal 1 (Week 2): Pt will self-propel w/c 50' w/ min assist PT Short Term Goal 2 (Week 2): Pt will attend to visual midline during functional mobility w/ mod cues PT Short Term Goal 3 (Week 2): Pt will ambulate 76' w/ mod assist w/ LRAD  Skilled Therapeutic Interventions/Progress Updates:   Pt received in w/c and agreeable to therapy, no c/o pain. Pt very distracted by environment in her room. Needed max cues to attend to task of changing shirt (shirt she was wearing was soaked on front, pt unable to state why), verbal and tactile cues for sequencing of task w/ L hemi arm. Max assist overall to doff/don shirt and don robe. Watched face w/ supervision using wash cloth. Assisted RN w/ provided medications. Pt needed mod verbal and auditory cues to scan to midline to find cup of medicine. Worked on w/c mobility remainder of session. Pt self-propelled w/c 100'x2 w/ min assist overall and max verbal, tactile, and visual cues for obstacle avoidance on L side. Pt very slow but able to attend to task well, even in busier hallway environment. Min cues to problem solve and scan to L side when w/c would bump into object. Pt requesting to stay up in chair at end of session. Ended session in w/c and under direct supervision of NT at nurses station, all needs met.   Therapy Documentation Precautions:  Precautions Precautions: Fall Precaution Comments: L neglect, L hemiparesis Restrictions Weight Bearing Restrictions: No  Therapy/Group: Individual Therapy  Lillyonna Armstead Clent Demark 12/09/2019, 9:43 AM

## 2019-12-10 ENCOUNTER — Inpatient Hospital Stay (HOSPITAL_COMMUNITY): Payer: Self-pay | Admitting: Speech Pathology

## 2019-12-10 LAB — GLUCOSE, CAPILLARY
Glucose-Capillary: 100 mg/dL — ABNORMAL HIGH (ref 70–99)
Glucose-Capillary: 102 mg/dL — ABNORMAL HIGH (ref 70–99)
Glucose-Capillary: 103 mg/dL — ABNORMAL HIGH (ref 70–99)
Glucose-Capillary: 105 mg/dL — ABNORMAL HIGH (ref 70–99)
Glucose-Capillary: 116 mg/dL — ABNORMAL HIGH (ref 70–99)
Glucose-Capillary: 86 mg/dL (ref 70–99)
Glucose-Capillary: 95 mg/dL (ref 70–99)

## 2019-12-10 NOTE — Progress Notes (Signed)
Patient cried during daughter's visit, saying she feels overwhelmed. Daughter expressed concern about patient's ongoing confusion, distress, and inability to get settled. Patient made comfortable at nurses' station, listening to golden oldies music with staff, mood brightened. Daughter to bring in grooming equipment for more comfortable hair style tonight. Resting comfortably in room at this time.

## 2019-12-10 NOTE — Progress Notes (Signed)
Speech Language Pathology Daily Session Note  Patient Details  Name: Olivia Mann MRN: [id number] Date of Birth: 1956/01/30  Today's Date: 12/10/2019 SLP Individual Time: 0805-0900 SLP Individual Time Calculation (min): 55 min  Short Term Goals: Week 2: SLP Short Term Goal 1 (Week 2): Pt will consume therapeutic trials of dys 2 textures with min cues for use of swallowing precautions and minimal overt s/s of aspiration over 3 consecutive sessions prior to advancement. SLP Short Term Goal 2 (Week 2): Pt will locate items at midline during functional tasks with Max assist verbal and visual cues in 25% of opportunities. SLP Short Term Goal 3 (Week 2): Pt will sustain her attention to basic, familiar tasks for 3 minutes with mod verbal cues for redirection. SLP Short Term Goal 4 (Week 2): Pt will complete basic, familiar tasks wtih mod assist for functional problem solving  Skilled Therapeutic Interventions:  Pt was seen for skilled ST targeting goals for cognition and dysphagia.  Pt was in bed upon arrival, resting but agreeable to participating in ST with minimal encouragement.  Pt requested to use the bathroom when asked by therapist and was transferred on and off commode where she was continent of both bowel and bladder.  During transfers and hygiene pt needed max assist multimodal cues for safety due to impulsivity and poor awareness of obstacles in her left environment.  Pt also needed max assist multimodal cues for task initiation, sequencing, and cessation when completing hand hygiene and washing her face after toileting.  Pt consumed ~3 bites of pureed textures and took ~5 sips of thin liquids during breakfast meal with no overt s/s of aspiration.  She did exhibit explosive coughing x1 when taking liquid medications which SLP suspects to be related to decreased rate and portion control.  Pt needed mod-max assist verbal cues for use of swallowing precautions and attention to meal during breakfast.   Pt was left in wheelchair with lap tray in place, chair alarm set, and call bell within reach.  Continue per current plan of care.    Pain Pain Assessment Pain Scale: 0-10 Pain Score: 0-No pain  Therapy/Group: Individual Therapy  Jekhi Bolin, Melanee Spry 12/10/2019, 9:03 AM

## 2019-12-10 NOTE — Progress Notes (Addendum)
[facility] PHYSICAL MEDICINE & REHABILITATION PROGRESS NOTE  Subjective/Complaints: Slept most of night. No new problems. Up at nurses station later in morning  ROS: limited d/t cognitive  Objective: Vital Signs: Blood pressure 124/80, pulse 84, temperature 98.9 F (37.2 C), resp. rate 18, height 5\' 1"  (1.549 m), weight 55.8 kg, SpO2 100 %. No results found. No results for input(s): WBC, HGB, HCT, PLT in the last 72 hours. No results for input(s): NA, K, CL, CO2, GLUCOSE, BUN, CREATININE, CALCIUM in the last 72 hours.  Physical Exam: BP 124/80 (BP Location: Left Arm)   Pulse 84   Temp 98.9 F (37.2 C)   Resp 18   Ht 5\' 1"  (1.549 m)   Wt 55.8 kg   SpO2 100%   BMI 23.24 kg/m  Constitutional: No distress . Vital signs reviewed. HEENT: EOMI, oral membranes moist, NGT Neck: supple Cardiovascular: RRR without murmur. No JVD    Respiratory: CTA Bilaterally without wheezes or rales. Normal effort    GI: BS +, non-tender, non-distended  Skin: staples along right scalp/forehead Psych: remains distracted Musc: No edema in extremities.  No tenderness in extremities. Neurological: oriented to person. Follows simple commands. Limited eye contact unless cued. Limited awareness of deficits Decreased pain sense left arm and leg. LUE and LLE 0/5--no volitional movement. No resting tone  Assessment/Plan: 1. Functional deficits secondary to subarachnoid hemorrhage/hematoma status post right frontotemporal craniotomy which require 3+ hours per day of interdisciplinary therapy in a comprehensive inpatient rehab setting.  Physiatrist is providing close team supervision and 24 hour management of active medical problems listed below.  Physiatrist and rehab team continue to assess barriers to discharge/monitor patient progress toward functional and medical goals  Care Tool:  Bathing    Body parts bathed by patient: Chest, Abdomen, Front perineal area, Right upper leg, Left upper leg, Face,  Left arm, Buttocks   Body parts bathed by helper: Face     Bathing assist Assist Level: Maximal Assistance - Patient 24 - 49%     Upper Body Dressing/Undressing Upper body dressing   What is the patient wearing?: Pull over shirt    Upper body assist Assist Level: Moderate Assistance - Patient 50 - 74%    Lower Body Dressing/Undressing Lower body dressing      What is the patient wearing?: Incontinence brief, Pants     Lower body assist Assist for lower body dressing: Moderate Assistance - Patient 50 - 74%     Toileting Toileting    Toileting assist Assist for toileting: Maximal Assistance - Patient 25 - 49%     Transfers Chair/bed transfer  Transfers assist     Chair/bed transfer assist level: Minimal Assistance - Patient > 75%     Locomotion Ambulation   Ambulation assist      Assist level: Maximal Assistance - Patient 25 - 49% Assistive device: Other (comment)(rail in hallway) Max distance: 15'   Walk 10 feet activity   Assist     Assist level: Maximal Assistance - Patient 25 - 49% Assistive device: Parallel bars   Walk 50 feet activity   Assist Walk 50 feet with 2 turns activity did not occur: Safety/medical concerns         Walk 150 feet activity   Assist Walk 150 feet activity did not occur: Safety/medical concerns         Walk 10 feet on uneven surface  activity   Assist Walk 10 feet on uneven surfaces activity did not occur: Safety/medical concerns  Wheelchair     Assist Will patient use wheelchair at discharge?: (TBD) Type of Wheelchair: Manual    Wheelchair assist level: Minimal Assistance - Patient > 75% Max wheelchair distance: 100'    Wheelchair 50 feet with 2 turns activity    Assist        Assist Level: Minimal Assistance - Patient > 75%   Wheelchair 150 feet activity     Assist            1.  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  Status  post right frontotemporal craniotomy for clipping of internal carotid artery aneurysm as well as evacuation of right temporal hematoma 11/19/2019  Continue CIR PT, OT, SLP   2.  Antithrombotics: -DVT/anticoagulation: Subcutaneous Lovenox initiated 11/22/2019.  Monitor for any bleeding episodes               -antiplatelet therapy: N/A 3. Pain Management: Oxycodone as needed 4. Mood: Xanax 1 mg every 6 hours as needed---limit as possible             -antipsychotic agents: N/A 5. Neuropsych: This patient is not capable of making decisions on her own behalf.  -consider ritalin trial to improve attention and initiation 6. Skin/Wound Care: Routine skin checks 7. Fluids/Electrolytes/Nutrition: Routine in and outs.    -NG feeds decreased at night to help with appetite   -protein for low albumin  -changed IVF to HS only to help with therapies  -continue NGT for now as she just isn't eating consistently enough  1/23 -add megace today  1/24- eating little 8. Seizure prophylaxis.   Continue Keppra 1000 mg twice daily 9.  Post stroke dysphagia:    -continue Dysphagia #1 thins liquids.   -advance per speech therapy  1/19: reduced TF by half to help promote appetite 10. Hypertension.  Completing course of Nimotop.                good control 1/24--no changes today    11. Hypokalemia  Potassium up to 4.4 1/18 12. Increased LFT's. Likely reactive  -recheck later this week     LOS: 8 days A FACE TO FACE EVALUATION WAS PERFORMED  Ranelle Oyster 12/10/2019, 12:09 PM

## 2019-12-11 ENCOUNTER — Inpatient Hospital Stay (HOSPITAL_COMMUNITY): Payer: Self-pay | Admitting: Speech Pathology

## 2019-12-11 ENCOUNTER — Inpatient Hospital Stay (HOSPITAL_COMMUNITY): Payer: Self-pay | Admitting: Physical Therapy

## 2019-12-11 ENCOUNTER — Inpatient Hospital Stay (HOSPITAL_COMMUNITY): Payer: Self-pay

## 2019-12-11 LAB — GLUCOSE, CAPILLARY
Glucose-Capillary: 105 mg/dL — ABNORMAL HIGH (ref 70–99)
Glucose-Capillary: 135 mg/dL — ABNORMAL HIGH (ref 70–99)
Glucose-Capillary: 82 mg/dL (ref 70–99)
Glucose-Capillary: 92 mg/dL (ref 70–99)
Glucose-Capillary: 98 mg/dL (ref 70–99)

## 2019-12-11 NOTE — Plan of Care (Signed)
  Problem: Consults Goal: RH GENERAL PATIENT EDUCATION Description: See Patient Education module for education specifics. Outcome: Progressing Goal: Skin Care Protocol Initiated - if Braden Score 18 or less Description: If consults are not indicated, leave blank or document N/A Outcome: Progressing Goal: Nutrition Consult-if indicated Outcome: Progressing   Problem: RH BOWEL ELIMINATION Goal: RH STG MANAGE BOWEL WITH ASSISTANCE Description: STG Manage Bowel with min Assistance. Outcome: Progressing   Problem: RH BLADDER ELIMINATION Goal: RH STG MANAGE BLADDER WITH ASSISTANCE Description: STG Manage Bladder With min Assistance Outcome: Progressing   Problem: RH SAFETY Goal: RH STG ADHERE TO SAFETY PRECAUTIONS W/ASSISTANCE/DEVICE Description: STG Adhere to Safety Precautions With cues and reminders Assistance/Device. Outcome: Progressing Goal: RH STG DECREASED RISK OF FALL WITH ASSISTANCE Description: STG Decreased Risk of Fall With cues and reminders Assistance. Outcome: Progressing   Problem: RH PAIN MANAGEMENT Goal: RH STG PAIN MANAGED AT OR BELOW PT'S PAIN GOAL Description: Pain level less that 4 on scale of 0-10 Outcome: Progressing   Problem: RH KNOWLEDGE DEFICIT GENERAL Goal: RH STG INCREASE KNOWLEDGE OF SELF CARE AFTER HOSPITALIZATION Description: Pt will be able to adhere to medication regimen, dietary restrictions and lifestyle modification to prevent complications with mod I assist from family.  Outcome: Progressing   

## 2019-12-11 NOTE — Progress Notes (Signed)
Occupational Therapy Weekly Progress Note  Patient Details  Name: Olivia Mann MRN: [id number] Date of Birth: [birth year]  Beginning of progress report period: December 02, 2019 End of progress report period: December 11, 2019  Today's Date: 12/11/2019 OT Individual Time: 1300-1345 OT Individual Time Calculation (min): 45 min    Patient has met 1 of 3 short term goals.  Pt is making slow progress toward her OT goals. Pt is able to complete UB bathing and dressing with mod A overall. She has improved in LB ADLs, and requires mod A. Pt has also improved in transfers and can complete stand pivot's to the R with min A and to the L with mod A. Pt's L inattention has improved and she requires less cueing overall to attend to midline. Pt's LUE remains limited with no active movement and e-stim has been initiated to assist with shoulder subluxation and muscle activation.   Patient continues to demonstrate the following deficits: muscle weakness, motor apraxia, decreased coordination and decreased motor planning, decreased midline orientation, decreased attention to left and left side neglect, decreased initiation, decreased attention, decreased awareness, decreased problem solving, decreased safety awareness, decreased memory and delayed processing and decreased sitting balance, decreased standing balance, decreased postural control, hemiplegia and decreased balance strategies and therefore will continue to benefit from skilled OT intervention to enhance overall performance with BADL and Reduce care partner burden.  Patient progressing toward long term goals..  Continue plan of care.  OT Short Term Goals Week 1:  OT Short Term Goal 1 (Week 1): Pt will complete BSC transfer with Mod A OT Short Term Goal 1 - Progress (Week 1): Met OT Short Term Goal 2 (Week 1): Pt will complete LB self care with Mod A for dynamic standing balance OT Short Term Goal 2 - Progress (Week 1): Progressing toward goal OT Short  Term Goal 3 (Week 1): Pt will utilize head turns towards the Lt to locate 2 items needed during ADL session with mod cuing OT Short Term Goal 3 - Progress (Week 1): Progressing toward goal Week 2:  OT Short Term Goal 1 (Week 2): Pt will utilize compensatory methods to locate 2 ADL items at midline with mod cueing OT Short Term Goal 2 (Week 2): Pt will don shirt with min A OT Short Term Goal 3 (Week 2): Pt will complete 2/3 toileting tasks with mod A  Skilled Therapeutic Interventions/Progress Updates:    Pt received supine in bed no c/o pain. Pt very perseverative entire session on removal of cortrak. Pt oriented to 1/2 of place- stating hospital but thinking she is in [place]. Pt completed bed mobility to EOB with min A. Pt able to maintain sitting balance EOB with close (S). Pt completed stand pivot transfer with mod A. Pt was set up to eat D1 and thin liquid lunch. Pt required mod cueing throughout for attention to task- coughing when she would begin talking about cortrak removal between bites. Pt also cued for bite size regulation. Pt with much improved attention to midline and L side of tray- independently scanning tray and locating items on L side! Pt completed oral care at the sink with min A to set up toothbrush, requiring min cueing for attention to L. Pt declined any ADLs 2/2 being cold this session. Pt requested to return to bed and completed stand pivot transfer toward the R with min A. Pt was left supine with all needs met. LUE propped up on pillow and saebo applied as described below.  Unattended E stim- skin in tact after 20 min stimulation-no adverse reactions. Applied to pt's anterior and middle deltoid to reduce subluxation and increase muscle activation/tone.  Saebo Stim One 330 pulse width 35 Hz pulse rate On 8 sec/ off 8 sec Ramp up/ down 2 sec Symmetrical Biphasic wave form  Max intensity 115m at 500 Ohm load   Therapy Documentation Precautions:  Precautions Precautions:  Fall Precaution Comments: L neglect, L hemiparesis Restrictions Weight Bearing Restrictions: No   Therapy/Group: Individual Therapy  SCurtis Sites1/25/2021, 6:57 AM

## 2019-12-11 NOTE — Progress Notes (Signed)
[facility] PHYSICAL MEDICINE & REHABILITATION PROGRESS NOTE  Subjective/Complaints: Slept most of night. No new problems.  Says she is confused.  Denies pain. Doesn't eat much at baseline and would like her NGT out.   ROS: limited d/t cognitive  Objective: Vital Signs: Blood pressure 127/84, pulse 88, temperature 97.8 F (36.6 C), temperature source Oral, resp. rate 18, height 5\' 1"  (1.549 m), weight 56.2 kg, SpO2 100 %. No results found. No results for input(s): WBC, HGB, HCT, PLT in the last 72 hours. No results for input(s): NA, K, CL, CO2, GLUCOSE, BUN, CREATININE, CALCIUM in the last 72 hours.  Physical Exam: BP 127/84 (BP Location: Left Arm)   Pulse 88   Temp 97.8 F (36.6 C) (Oral)   Resp 18   Ht 5\' 1"  (1.549 m)   Wt 56.2 kg   SpO2 100%   BMI 23.43 kg/m  Constitutional: No distress . Vital signs reviewed. HEENT: EOMI, oral membranes moist, NGT Neck: supple Cardiovascular: RRR without murmur. No JVD    Respiratory: CTA Bilaterally without wheezes or rales. Normal effort    GI: BS +, non-tender, non-distended  Skin: staples along right scalp/forehead Psych: remains distracted Musc: No edema in extremities.  No tenderness in extremities. Neurological: oriented to person. Follows simple commands. Limited eye contact unless cued. Limited awareness of deficits Decreased pain sense left arm and leg. LUE and LLE 0/5--no volitional movement. No resting tone  Assessment/Plan: 1. Functional deficits secondary to subarachnoid hemorrhage/hematoma status post right frontotemporal craniotomy which require 3+ hours per day of interdisciplinary therapy in a comprehensive inpatient rehab setting.  Physiatrist is providing close team supervision and 24 hour management of active medical problems listed below.  Physiatrist and rehab team continue to assess barriers to discharge/monitor patient progress toward functional and medical goals  Care Tool:  Bathing    Body parts  bathed by patient: Chest, Abdomen, Front perineal area, Right upper leg, Left upper leg, Face, Left arm, Buttocks   Body parts bathed by helper: Face     Bathing assist Assist Level: Maximal Assistance - Patient 24 - 49%     Upper Body Dressing/Undressing Upper body dressing   What is the patient wearing?: Pull over shirt    Upper body assist Assist Level: Moderate Assistance - Patient 50 - 74%    Lower Body Dressing/Undressing Lower body dressing      What is the patient wearing?: Incontinence brief, Pants     Lower body assist Assist for lower body dressing: Moderate Assistance - Patient 50 - 74%     Toileting Toileting    Toileting assist Assist for toileting: Maximal Assistance - Patient 25 - 49%     Transfers Chair/bed transfer  Transfers assist     Chair/bed transfer assist level: Minimal Assistance - Patient > 75%     Locomotion Ambulation   Ambulation assist      Assist level: Maximal Assistance - Patient 25 - 49% Assistive device: Other (comment)(rail in hallway) Max distance: 15'   Walk 10 feet activity   Assist     Assist level: Maximal Assistance - Patient 25 - 49% Assistive device: Parallel bars   Walk 50 feet activity   Assist Walk 50 feet with 2 turns activity did not occur: Safety/medical concerns         Walk 150 feet activity   Assist Walk 150 feet activity did not occur: Safety/medical concerns         Walk 10 feet on uneven surface  activity   Assist Walk 10 feet on uneven surfaces activity did not occur: Safety/medical concerns         Wheelchair     Assist Will patient use wheelchair at discharge?: (TBD) Type of Wheelchair: Manual    Wheelchair assist level: Minimal Assistance - Patient > 75% Max wheelchair distance: 100'    Wheelchair 50 feet with 2 turns activity    Assist        Assist Level: Minimal Assistance - Patient > 75%   Wheelchair 150 feet activity     Assist             1.  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  Antithrombotics: -DVT/anticoagulation: Subcutaneous Lovenox initiated 11/22/2019.  Monitor for any bleeding episodes               -antiplatelet therapy: N/A 3. Pain Management: Oxycodone as needed 4. Mood: Xanax 1 mg every 6 hours as needed---limit as possible             -antipsychotic agents: N/A 5. Neuropsych: This patient is not capable of making decisions on her own behalf.  -consider ritalin trial to improve attention and initiation 6. Skin/Wound Care: Routine skin checks 7. Fluids/Electrolytes/Nutrition: Routine in and outs.    -NG feeds decreased at night to help with appetite   -protein for low albumin  -changed IVF to HS only to help with therapies  -continue NGT for now as she just isn't eating consistently enough  1/23 -add megace today  1/24- eating little  1/25: states that she doesn't eat much at baseline, daughter corroborates. Would like NGT removed; will discuss with SLP.  8. Seizure prophylaxis.   Continue Keppra 1000 mg twice daily 9.  Post stroke dysphagia:    -continue Dysphagia #1 thins liquids.   -advance per speech therapy  1/19: reduced TF by half to help promote appetite 10. Hypertension.  Completing course of Nimotop.                good control 1/24, 1/25--no changes today 11. Hypokalemia  Potassium up to 4.4 1/18 12. Increased LFT's. Likely reactive  -recheck later this week     LOS: 9 days A FACE TO FACE EVALUATION WAS PERFORMED  Olivia Mann 12/11/2019, 12:02 PM

## 2019-12-11 NOTE — Progress Notes (Signed)
Speech Language Pathology Daily Session Note  Patient Details  Name: Olivia Mann MRN: [id number] Date of Birth: 08/06/1956  Today's Date: 12/11/2019 SLP Individual Time: 0825-0920 SLP Individual Time Calculation (min): 55 min  Short Term Goals: Week 2: SLP Short Term Goal 1 (Week 2): Pt will consume therapeutic trials of dys 2 textures with min cues for use of swallowing precautions and minimal overt s/s of aspiration over 3 consecutive sessions prior to advancement. SLP Short Term Goal 2 (Week 2): Pt will locate items at midline during functional tasks with Max assist verbal and visual cues in 25% of opportunities. SLP Short Term Goal 3 (Week 2): Pt will sustain her attention to basic, familiar tasks for 3 minutes with mod verbal cues for redirection. SLP Short Term Goal 4 (Week 2): Pt will complete basic, familiar tasks wtih mod assist for functional problem solving  Skilled Therapeutic Interventions:Skilled treatment session focused on cognitive and dysphagia goals. Upon arrival, patient was attempting to get out of bed by herself. SLP facilitated session by providing Max A verbal cues for problem solving and sequencing with transfer due to impulsivity. Patient was disoriented as she thought she was in a friends house and required constant reorientation throughout session.  Patient requested to call her daughter to confirm orientation, however, phone call only appeared to confuse the patient more. Patient consumed Dys. 1 textures with thin liquids via straw with minimal overt s/s of aspiration but required Mod A verbal cues for attention to self-feeding. Recommend patient continue current diet. SLP also facilitated session by providing Mod-Max A verbal cues for scanning to left field of environment during basic self care tasks.  Patient left upright at RN station with all alarm on. Continue with current plan of care.      Pain Pain Assessment Pain Scale: 0-10 Pain Score: 0-No  pain  Therapy/Group: Individual Therapy   Akshat Minehart 12/11/2019, 12:24 PM

## 2019-12-11 NOTE — Progress Notes (Signed)
Physical Therapy Session Note  Patient Details  Name: Olivia Mann MRN: [id number] Date of Birth: 15-Aug-1956  Today's Date: 12/11/2019 PT Individual Time: 1000-1055 AND 1505-1530 PT Individual Time Calculation (min): 55 min AND 10 min  Short Term Goals: Week 2:  PT Short Term Goal 1 (Week 2): Pt will self-propel w/c 50' w/ min assist PT Short Term Goal 2 (Week 2): Pt will attend to visual midline during functional mobility w/ mod cues PT Short Term Goal 3 (Week 2): Pt will ambulate 50' w/ mod assist w/ LRAD  Skilled Therapeutic Interventions/Progress Updates:   Session 1:  Pt received at nurses station in w/c, agreeable to therapy and denies pain. Pt very perseverative on calling her sister. Dialed phone for her and she left message. Worked on gait training and LLE NMR. Ambulated 10' and 20' x2 w/ RW and LUE orthosis and L DF ACE wrap. Mod-max assist overall for lateral weight shifting, and upright balance. Needed min assist for LLE management. Able to initiate and take steps, needed occasional manual assist for step length. No assist needed for L knee management. Worked on LLE w/ static standing balance while matching playing cards. Mod assist overall for balance w/ tactile and verbal cues for neutral L knee and hip extension, pt able to place 8/9 cards w/ mod verbal and visual cues for L scanning. Worked on w/c mobility on way back to room. Pt self-propelled w/c min assist x150'. Most cues needed for L obstacle avoidance and problem solving when the w/c hit objects. Ended session in supine, all needs in reach.   Session 2:  Pt in supine and initially agreeable to therapy. Supine>sit w/ min assist. Pt then refusing to get up stating "I'm just trying to get my shut eye". Pt very perseverative on getting NG tube out, she had multiple questions about why she had it w/o her consent. Discussed that she has been unable to provide consent at times during this hospitalization and that the treatment team  (including MD) was aware of her request. Assured her that it would be discussed in team conference and she was ok with that. Min assist to don robe before returning to supine. Ended session in supine and all needs in reach. Missed 20 min of skilled PT.  Therapy Documentation Precautions:  Precautions Precautions: Fall Precaution Comments: L neglect, L hemiparesis Restrictions Weight Bearing Restrictions: No  Therapy/Group: Individual Therapy  Keylen Uzelac Melton Krebs 12/11/2019, 12:32 PM

## 2019-12-12 ENCOUNTER — Inpatient Hospital Stay (HOSPITAL_COMMUNITY): Payer: Self-pay

## 2019-12-12 ENCOUNTER — Inpatient Hospital Stay (HOSPITAL_COMMUNITY): Payer: Self-pay | Admitting: Speech Pathology

## 2019-12-12 ENCOUNTER — Inpatient Hospital Stay (HOSPITAL_COMMUNITY): Payer: Self-pay | Admitting: Physical Therapy

## 2019-12-12 LAB — GLUCOSE, CAPILLARY
Glucose-Capillary: 94 mg/dL (ref 70–99)
Glucose-Capillary: 113 mg/dL — ABNORMAL HIGH (ref 70–99)
Glucose-Capillary: 88 mg/dL (ref 70–99)
Glucose-Capillary: 114 mg/dL — ABNORMAL HIGH (ref 70–99)
Glucose-Capillary: 120 mg/dL — ABNORMAL HIGH (ref 70–99)

## 2019-12-12 LAB — COMPREHENSIVE METABOLIC PANEL
ALT: 57 U/L — ABNORMAL HIGH (ref 0–44)
AST: 30 U/L (ref 15–41)
Albumin: 3.4 g/dL — ABNORMAL LOW (ref 3.5–5.0)
Alkaline Phosphatase: 99 U/L (ref 38–126)
Anion gap: 11 (ref 5–15)
BUN: 16 mg/dL (ref 8–23)
CO2: 24 mmol/L (ref 22–32)
Calcium: 9.8 mg/dL (ref 8.9–10.3)
Chloride: 103 mmol/L (ref 98–111)
Creatinine, Ser: 0.63 mg/dL (ref 0.44–1.00)
GFR calc Af Amer: >60 mL/min (ref >60)
GFR calc non Af Amer: >60 mL/min (ref >60)
Glucose, Bld: 95 mg/dL (ref 70–99)
Potassium: 3.9 mmol/L (ref 3.5–5.1)
Sodium: 138 mmol/L (ref 135–145)
Total Bilirubin: 0.4 mg/dL (ref 0.3–1.2)
Total Protein: 7 g/dL (ref 6.5–8.1)

## 2019-12-12 LAB — PREALBUMIN: Prealbumin: 28.2 mg/dL (ref 18–38)

## 2019-12-12 MED ORDER — PRO-STAT SUGAR FREE PO LIQD
30.0000 mL | Freq: Two times a day (BID) | ORAL | Status: DC
  Administered 2019-12-12 – 2019-12-22 (×19): 30 mL via ORAL
  Filled 2019-12-12 (×20): qty 30

## 2019-12-12 MED ORDER — METHYLPHENIDATE HCL 5 MG PO TABS
5.0000 mg | ORAL_TABLET | Freq: Two times a day (BID) | ORAL | Status: DC
  Administered 2019-12-13 – 2019-12-22 (×19): 5 mg via ORAL
  Filled 2019-12-12 (×19): qty 1

## 2019-12-12 NOTE — Progress Notes (Signed)
[facility] PHYSICAL MEDICINE & REHABILITATION PROGRESS NOTE  Subjective/Complaints: Working with PT. Thinks she's in [place]. Irritated by NGT and wants it out   ROS: limited d/t cognitive  Objective: Vital Signs: Blood pressure 99/72, pulse 87, temperature 98.5 F (36.9 C), temperature source Oral, resp. rate 18, height 5\' 1"  (1.549 m), weight 51.7 kg, SpO2 100 %. No results found. No results for input(s): WBC, HGB, HCT, PLT in the last 72 hours. Recent Labs    12/12/19 0546  NA 138  K 3.9  CL 103  CO2 24  GLUCOSE 95  BUN 16  CREATININE 0.63  CALCIUM 9.8    Physical Exam: BP 99/72 (BP Location: Right Arm)   Pulse 87   Temp 98.5 F (36.9 C) (Oral)   Resp 18   Ht 5\' 1"  (1.549 m)   Wt 51.7 kg   SpO2 100%   BMI 21.54 kg/m  Constitutional: No distress . Vital signs reviewed. HEENT: EOMI, oral membranes moist Neck: supple Cardiovascular: RRR without murmur. No JVD    Respiratory: CTA Bilaterally without wheezes or rales. Normal effort    GI: BS +, non-tender, non-distended  Skin: scalp incision CDI Psych: more alert. Distracted.  Musc: No edema in extremities.  No tenderness in extremities. Neurological: oriented to person. Thinks she's in [place], despite being re-oriented. left inattention. Decreased pain sense left arm and leg. LUE and LLE 0/5--no volitional movement in either (although leg moving with therapy). No resting tone  Assessment/Plan: 1. Functional deficits secondary to subarachnoid hemorrhage/hematoma status post right frontotemporal craniotomy which require 3+ hours per day of interdisciplinary therapy in a comprehensive inpatient rehab setting.  Physiatrist is providing close team supervision and 24 hour management of active medical problems listed below.  Physiatrist and rehab team continue to assess barriers to discharge/monitor patient progress toward functional and medical goals  Care Tool:  Bathing    Body parts bathed by patient:  Chest, Abdomen, Front perineal area, Right upper leg, Left upper leg, Face, Left arm, Buttocks   Body parts bathed by helper: Face     Bathing assist Assist Level: Maximal Assistance - Patient 24 - 49%     Upper Body Dressing/Undressing Upper body dressing   What is the patient wearing?: Pull over shirt    Upper body assist Assist Level: Moderate Assistance - Patient 50 - 74%    Lower Body Dressing/Undressing Lower body dressing      What is the patient wearing?: Incontinence brief, Pants     Lower body assist Assist for lower body dressing: Moderate Assistance - Patient 50 - 74%     Toileting Toileting    Toileting assist Assist for toileting: Maximal Assistance - Patient 25 - 49%     Transfers Chair/bed transfer  Transfers assist     Chair/bed transfer assist level: Moderate Assistance - Patient 50 - 74%     Locomotion Ambulation   Ambulation assist      Assist level: Maximal Assistance - Patient 25 - 49% Assistive device: Walker-rolling Max distance: 20'   Walk 10 feet activity   Assist     Assist level: Maximal Assistance - Patient 25 - 49% Assistive device: Walker-rolling   Walk 50 feet activity   Assist Walk 50 feet with 2 turns activity did not occur: Safety/medical concerns         Walk 150 feet activity   Assist Walk 150 feet activity did not occur: Safety/medical concerns         Walk  10 feet on uneven surface  activity   Assist Walk 10 feet on uneven surfaces activity did not occur: Safety/medical concerns         Wheelchair     Assist Will patient use wheelchair at discharge?: (TBD) Type of Wheelchair: Manual    Wheelchair assist level: Minimal Assistance - Patient > 75% Max wheelchair distance: 150'    Wheelchair 50 feet with 2 turns activity    Assist        Assist Level: Minimal Assistance - Patient > 75%   Wheelchair 150 feet activity     Assist     Assist Level: Minimal Assistance  - Patient > 75%      1.  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  2.  Antithrombotics: -DVT/anticoagulation: Subcutaneous Lovenox initiated 11/22/2019.  Monitor for any bleeding episodes               -antiplatelet therapy: N/A 3. Pain Management: Oxycodone as needed 4. Mood: Xanax 1 mg every 6 hours as needed---limit as possible             -antipsychotic agents: N/A 5. Neuropsych: This patient is not capable of making decisions on her own behalf.  -1/26 begin trial of ritalin tomorrow 6. Skin/Wound Care: Routine skin checks 7. Fluids/Electrolytes/Nutrition: Routine in and outs.    -NG feeds decreased at night to help with appetite   -protein for low albumin  -changed IVF to HS only to help with therapies  -continue NGT for now as she just isn't eating consistently enough  1/23 -added megace    1/24- eating little  1/25: states that she doesn't eat much at baseline, daughter corroborates. Would like NGT removed; will discuss with SLP.  1/26: pt eating better. Doesn't eat much at baseline per above. I d'ced NGT today    -prealbumin 28 today 8. Seizure prophylaxis.   Continue Keppra 1000 mg twice daily 9.  Post stroke dysphagia:    -continue Dysphagia #1 thins liquids.   -advance per speech therapy  1/19: reduced TF by half to help promote appetite  1/26 stopped TF. Encourage PO intake as possible 10. Hypertension.  Completed course of Nimotop.                good control 1/24-26, actually a little soft 11. Hypokalemia  Potassium 3.9 1/26 12. Increased LFT's. Likely reactive  -1/26 LFT's continue to decrease. Almost normal today     LOS: 10 days A FACE TO FACE  EVALUATION WAS PERFORMED  Ranelle Oyster 12/12/2019, 12:28 PM

## 2019-12-12 NOTE — Progress Notes (Signed)
Speech Language Pathology Daily Session Note  Patient Details  Name: Olivia Mann MRN: [id number] Date of Birth: Jun 06, 1956  Today's Date: 12/12/2019 SLP Individual Time: 1055-1150 SLP Individual Time Calculation (min): 55 min  Short Term Goals: Week 2: SLP Short Term Goal 1 (Week 2): Pt will consume therapeutic trials of dys 2 textures with min cues for use of swallowing precautions and minimal overt s/s of aspiration over 3 consecutive sessions prior to advancement. SLP Short Term Goal 2 (Week 2): Pt will locate items at midline during functional tasks with Max assist verbal and visual cues in 25% of opportunities. SLP Short Term Goal 3 (Week 2): Pt will sustain her attention to basic, familiar tasks for 3 minutes with mod verbal cues for redirection. SLP Short Term Goal 4 (Week 2): Pt will complete basic, familiar tasks wtih mod assist for functional problem solving  Skilled Therapeutic Interventions: Skilled treatment session focused on dysphagia and cognitive goals. SLP facilitated session by providing trials of Dys. 2 textures. Patient demonstrated mildly prolonged mastication with complete oral clearance after independent use of a liquid wash. Patient consumed trials without overt s/s of aspiration but recommend continued trials with SLP prior to upgrade. SLP also facilitated session by providing Max A verbal cues for functional problem solving, visual scanning and sustained attention to a basic sorting task. Patient with intermittent confusion throughout session but was easily reoriented. Patient requested to lay down at end of session due to fatigue, therefore, patient was transferred back to bed. Patient left supine in bed with alarm on and all needs within reach. Continue with current plan of care.      Pain No/Denies Pain   Therapy/Group: Individual Therapy  Rhya Shan 12/12/2019, 12:20 PM

## 2019-12-12 NOTE — Progress Notes (Signed)
Occupational Therapy Session Note  Patient Details  Name: Olivia Mann MRN: [id number] Date of Birth: September 04, 1956  Today's Date: 12/12/2019 OT Individual Time: 1400-1515 OT Individual Time Calculation (min): 75 min    Short Term Goals: Week 2:  OT Short Term Goal 1 (Week 2): Pt will utilize compensatory methods to locate 2 ADL items at midline with mod cueing OT Short Term Goal 2 (Week 2): Pt will don shirt with min A OT Short Term Goal 3 (Week 2): Pt will complete 2/3 toileting tasks with mod A  Skilled Therapeutic Interventions/Progress Updates:    Pt received sitting up in w/c with NT. Discussed plan for session with pt's daughter as she walked out to leave to go home. Pt perseverative on where daughter had gone, insisting to go find her. Attempted to call pt's daughter to reassure pt but no answer. Pt required mod cueing to initiate transfer out of chair and into shower. Pt required mod A to transfer into shower with TTB. Mod A overall to doff all clothing. Pt initiated washing with no further cueing in shower. Pt required min A for UB bathing and mod A for LB. Pt compensating with L knee hyperextension to stand, requiring assistance for peri cleansing in standing. Pt donned tank top and sweater with mod A, cueing required for hemi technique. Max A to don pants. Attempted to teach pt single hand sock donning technique but she was unable to follow directions and required mod A to don overall. Pt required mod cueing to locate toothbrush at midline on busy sink. Pt completed oral care with set up assist. Pt was taken down to the therapy gym via w/c. Pt completed stand pivot transfer impulsively to the therapy mat. Pt sat and completed visual motor activity focused on scanning to the L, requiring max multimodal cueing to locate items in the L visual field and at midline. Kinesiotape was applied to pt's L shoulder to support shoulder sublux. Pt was returned to room and assisted in calling daughter. Pt  confused and unable to be oriented to place. Pt was left sitting up at the nurses desk with chair alarm belt fastened.    Therapy Documentation Precautions:  Precautions Precautions: Fall Precaution Comments: L neglect, L hemiparesis Restrictions Weight Bearing Restrictions: No   Therapy/Group: Individual Therapy  [name] 12/12/2019, 6:47 AM

## 2019-12-12 NOTE — Patient Care Conference (Signed)
Inpatient RehabilitationTeam Conference and Plan of Care Update Date: 12/12/2019   Time: 10:15 AM    Patient Name: Olivia Mann      Medical Record Number: [id number]  Date of Birth: January 23, 1956 Sex: Female         Room/Bed: 4W23C/4W23C-01 Payor Info: Payor: MEDICAID POTENTIAL / Plan: MEDICAID POTENTIAL / Product Type: *No Product type* /    Admit Date/Time:  12/02/2019 12:11 AM  Primary Diagnosis:  SAH (subarachnoid hemorrhage) (HCC)  Patient Active Problem List   Diagnosis Date Noted  . Hypokalemia   . Dysphagia, post-stroke   . Seizure prophylaxis   . Hemiparesis affecting left side as late effect of stroke (HCC)   . SAH (subarachnoid hemorrhage) (HCC)   . Tachypnea   . Tachycardia   . Hypertension   . Leukocytosis   . Cytotoxic brain edema (HCC) 11/20/2019  . Seizures (HCC) 11/20/2019  . Subarachnoid hemorrhage (HCC)   . Endotracheally intubated   . Macrocytosis without anemia   . Encephalopathy acute   . Subarachnoid hematoma (HCC) 11/18/2019    Expected Discharge Date: Expected Discharge Date: 12/27/19  Team Members Present: Physician leading conference: Dr. Faith Rogue Social Worker Present: Amada Jupiter, LCSW Nurse Present: Chana Bode, RN;Other (comment)(Inka [place], LPN) Case Manager: Roderic Palau, RN PT Present: Carlynn Purl, PT OT Present: Jake Shark, OT SLP Present: Feliberto Gottron, SLP PPS Coordinator present : Fae Pippin, SLP     Current Status/Progress Goal Weekly Team Focus  Bowel/Bladder   pt is continent of B/B, LBM 12/10/19  pt will remain continent of B/B  Q2h toileting/ PRN   Swallow/Nutrition/ Hydration   Dys. 1 textures with thin liquids, Mod A  Supervision  tolerance of current diet with use of strategies, trials of Dys. 2 textures   ADL's   Mod A UB dressing/bathing, mod A LB bathing/dressing, min-mod A transfers. No active movement in the LUE with half finger shoulder sublux, beginning e-stim  min A- CGA overall  Initiation, orientation,  ADL retraining, ADL transfers, family edu   Mobility   min-mod assist sit<>stands and transfers, mod-max assist gait w/ RW x20', mod-max dynamic standing balance  CGA overall  LLE NMR, progressing gait, w/c mobility, postural control and L inattention   Communication             Safety/Cognition/ Behavioral Observations  Max-Total A  Min A  orientation, sustained attention, visual scanning   Pain   Pt c/o of headache. PRN oxy and tylenol was offered but pt declined.  Pts pain level below 3.  Assess pain shift/prn   Skin   pt has surgical scar to R anterior head, OTA.  pt will remain free of skin breakdown and infection.  Assess skin Qshift/PRN    Rehab Goals Patient on target to meet rehab goals: Yes *See Care Plan and progress notes for long and short-term goals.     Barriers to Discharge  Current Status/Progress Possible Resolutions Date Resolved   Nursing                  PT                    OT                  SLP                SW                Discharge Planning/Teaching Needs:  Plan to return home with daughter an dson in [place] who can provide 24/7 assistance  Teaching needs TBD   Team Discussion: SAH after aneurysm, crani, clipping, L hemi, NG DC, eating, improving, cog deficits.  RN cortrak DC, no pain, drinking, BM 1/24, cont B/B.  OT perseverative, agitated, mod B/D, min/mod transfers, goals min/CGA.  PT min/mod transfers, amb mod/max walker, goals CGA overall.  SLP D1thins, coughs at times, disoriented mornings, has Dtr, needs more fam ed.   Revisions to Treatment Plan: N/A     Medical Summary Current Status: left SAH d/t ruptured aneurysm with dense right HP and visual spatial deficits. NGT removed Weekly Focus/Goal: nutrition, cognitive deficits. orthotics,  Barriers to Discharge: Behavior;Medical stability       Continued Need for Acute Rehabilitation Level of Care: The patient requires daily medical management by a physician with specialized training  in physical medicine and rehabilitation for the following reasons: Direction of a multidisciplinary physical rehabilitation program to maximize functional independence : Yes Medical management of patient stability for increased activity during participation in an intensive rehabilitation regime.: Yes Analysis of laboratory values and/or radiology reports with any subsequent need for medication adjustment and/or medical intervention. : Yes   I attest that I was present, lead the team conference, and concur with the assessment and plan of the team.   Retta Diones 12/13/2019, 9:11 AM   Team conference was held via web/ teleconference due to [place] - 19

## 2019-12-12 NOTE — Progress Notes (Signed)
Orthopedic Tech Progress Note Patient Details:  Olivia Mann February 11, 1956 [id number] Called in order to [facility] for an AFO Patient ID: Olivia Mann, female   DOB: 07/18/1956, 64 y.o.   MRN: [id number]   Olivia Mann 12/12/2019, 10:10 AM

## 2019-12-12 NOTE — Plan of Care (Signed)
  Problem: Consults Goal: RH GENERAL PATIENT EDUCATION Description: See Patient Education module for education specifics. Outcome: Progressing Goal: Skin Care Protocol Initiated - if Braden Score 18 or less Description: If consults are not indicated, leave blank or document N/A Outcome: Progressing Goal: Nutrition Consult-if indicated Outcome: Progressing   Problem: RH BOWEL ELIMINATION Goal: RH STG MANAGE BOWEL WITH ASSISTANCE Description: STG Manage Bowel with min Assistance. Outcome: Progressing   Problem: RH BLADDER ELIMINATION Goal: RH STG MANAGE BLADDER WITH ASSISTANCE Description: STG Manage Bladder With min Assistance Outcome: Progressing   Problem: RH SAFETY Goal: RH STG ADHERE TO SAFETY PRECAUTIONS W/ASSISTANCE/DEVICE Description: STG Adhere to Safety Precautions With cues and reminders Assistance/Device. Outcome: Progressing Goal: RH STG DECREASED RISK OF FALL WITH ASSISTANCE Description: STG Decreased Risk of Fall With cues and reminders Assistance. Outcome: Progressing   Problem: RH PAIN MANAGEMENT Goal: RH STG PAIN MANAGED AT OR BELOW PT'S PAIN GOAL Description: Pain level less that 4 on scale of 0-10 Outcome: Progressing   Problem: RH KNOWLEDGE DEFICIT GENERAL Goal: RH STG INCREASE KNOWLEDGE OF SELF CARE AFTER HOSPITALIZATION Description: Pt will be able to adhere to medication regimen, dietary restrictions and lifestyle modification to prevent complications with mod I assist from family.  Outcome: Progressing   

## 2019-12-12 NOTE — Progress Notes (Signed)
Nutrition Follow-up  RD working remotely.  DOCUMENTATION CODES:   Not applicable  INTERVENTION:   - Ensure Enlive po TID, each supplement provides 350 kcal and 20 grams of protein  - Pro-stat 30 ml po BID, each supplement provides 100 kcal and 15 grams of protein  - Add Magic Cup BID with lunch and dinner meals, each supplement provides 290 kcal and 9 grams of protein  NUTRITION DIAGNOSIS:   Increased nutrient needs related to post-op healing, other (therapies) as evidenced by estimated needs.  Ongoing, being addressed via supplements  GOAL:   Patient will meet greater than or equal to 90% of their needs  Progressing  MONITOR:   PO intake, Supplement acceptance, Diet advancement, Labs, Weight trends  REASON FOR ASSESSMENT:   Consult Enteral/tube feeding initiation and management  ASSESSMENT:   64 year old female with unremarkable PMH. Pt presented on 11/18/19 with seizures and headaches. Cranial CT revealed SAH. 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 Pt underwent right frontotemporal craniotomy for clipping of internal carotid artery aneurysm as well as evacuation of right temporal hematoma on 12/16/19. Hospital course complicated by dysphagia, and pt was started on Dyshagia 1 diet with thin liquids as well as nasogastric tube feeds for nutritional support. Pt admitted to CIR on 1/16.  01/18 - transition to nocturnal TF 01/19 - rate of nocturnal TF decreased 01/23 - Megace initiated 01/26 - NG tube removed, TF d/c  Noted target d/c date of 2/10.  Noted pt refused nocturnal tube feeds last night. Per MD note today, pt eating better. NG tube removed today.  Pt accepting ~50% of Ensure Enlive supplements per [facility] documentation. RD will change Pro-stat to PO and add Magic Cups with meals to maximize kcal and protein intake.  Current weight is 5 lb below admission weight. Will continue to monitor  trends.  Meal Completion: 25-90% x last 5 recorded meals  Medications reviewed and include: colace, Ensure Enlive TID, SSI q 4 hours, Megace 400 mg BID, Ritalin, protonix  Labs reviewed. CBG's: 82-113 x 24 hours  Diet Order:   Diet Order            DIET - DYS 1 Room service appropriate? Yes; Fluid consistency: Thin  Diet effective now              EDUCATION NEEDS:   No education needs have been identified at this time  Skin:  Skin Assessment: Skin Integrity Issues: Incisions: head  Last BM:  12/10/19 medium type 5  Height:   Ht Readings from Last 1 Encounters:  12/02/19 5\' 1"  (1.549 m)    Weight:   Wt Readings from Last 1 Encounters:  12/12/19 51.7 kg    Ideal Body Weight:  47.7 kg  BMI:  Body mass index is 21.54 kg/m.  Estimated Nutritional Needs:   Kcal:  1700-1900  Protein:  85-100 grams  Fluid:  >/= 1.7 L    12/14/19, MS, RD, LDN Inpatient Clinical Dietitian Pager: [phone] Weekend/After Hours: [phone]

## 2019-12-12 NOTE — Progress Notes (Signed)
Pt refused the night tube feed. Pt stated she does not need that and refusing to take it. Pt was educated on the importance of having the tube feed via coretrack. Pt continued to refuse.

## 2019-12-12 NOTE — Progress Notes (Signed)
Physical Therapy Session Note  Patient Details  Name: Olivia Mann MRN: [id number] Date of Birth: [birth year]  Today's Date: 12/12/2019 PT Individual Time: 0800-0855 PT Individual Time Calculation (min): 55 min   Short Term Goals: Week 2:  PT Short Term Goal 1 (Week 2): Pt will self-propel w/c 50' w/ min assist PT Short Term Goal 2 (Week 2): Pt will attend to visual midline during functional mobility w/ mod cues PT Short Term Goal 3 (Week 2): Pt will ambulate 38' w/ mod assist w/ LRAD  Skilled Therapeutic Interventions/Progress Updates:   Pt received at nurses station, increased agitation and perseveration this morning. Pt adamant that she is in [place], [place] and not [place]. Perseverating on calling any family member on the phone to back up that she is in [place]. Pt only able to reach daughter Larena Glassman on the phone, however did not help w/ pt's orientation. Pt also perseverating on getting back home to change clothes, requesting therapist to call cab company. Max cues to orient to environment, that she was in the hospital and it would be unsafe to leave. Pt eventually agreeable to return to room to wash up. Washed up at sink in seated. Min cues to attend to task and wash LUE. Donned new shirt w/ mod-max assist and verbal cues for L hemi technique. Stand pivot transfer to toilet w/ max assist and step-by-step cues, total assist for clothing management and donned new pants. Pt washed bottom and periarea w/ wash cloth w/o assist once finished. Stand pivot back to w/c and pt washed hands and brushed teeth at sink w/ min verbal cues to attend to task and for thoroughness of task. Returned to nurses station and pt agreeable to eat breakfast. Ate ~25% of breakfast, however pt reported she eats very little for breakfast normally. Verbal and visual cues to scan to L visual environment for items on tray. Ended session under direct supervision of RN at nurses station, all needs met. Chair alarm belt  engaged.   Therapy Documentation Precautions:  Precautions Precautions: Fall Precaution Comments: L neglect, L hemiparesis Restrictions Weight Bearing Restrictions: No  Therapy/Group: Individual Therapy  Mckinnley Smithey Clent Demark 12/12/2019, 8:57 AM

## 2019-12-13 ENCOUNTER — Inpatient Hospital Stay (HOSPITAL_COMMUNITY): Payer: Self-pay

## 2019-12-13 ENCOUNTER — Inpatient Hospital Stay (HOSPITAL_COMMUNITY): Payer: Self-pay | Admitting: Speech Pathology

## 2019-12-13 ENCOUNTER — Inpatient Hospital Stay (HOSPITAL_COMMUNITY): Payer: Self-pay | Admitting: Physical Therapy

## 2019-12-13 ENCOUNTER — Inpatient Hospital Stay (HOSPITAL_COMMUNITY): Payer: Self-pay | Admitting: Occupational Therapy

## 2019-12-13 LAB — GLUCOSE, CAPILLARY
Glucose-Capillary: 90 mg/dL (ref 70–99)
Glucose-Capillary: 95 mg/dL (ref 70–99)
Glucose-Capillary: 95 mg/dL (ref 70–99)
Glucose-Capillary: 95 mg/dL (ref 70–99)
Glucose-Capillary: 97 mg/dL (ref 70–99)

## 2019-12-13 NOTE — Progress Notes (Signed)
[facility] PHYSICAL MEDICINE & REHABILITATION PROGRESS NOTE  Subjective/Complaints: Very happy that her NGT is removed. Denies pain, constipation. Sleeping well at night. Asks whether she can go home and do therapy for a day and then return.   ROS: limited d/t cognitive  Objective: Vital Signs: Blood pressure 129/76, pulse 84, temperature 99.1 F (37.3 C), temperature source Oral, resp. rate 18, height 5\' 1"  (1.549 m), weight 51.7 kg, SpO2 100 %. No results found. No results for input(s): WBC, HGB, HCT, PLT in the last 72 hours. Recent Labs    12/12/19 0546  NA 138  K 3.9  CL 103  CO2 24  GLUCOSE 95  BUN 16  CREATININE 0.63  CALCIUM 9.8    Physical Exam: BP 129/76 (BP Location: Right Arm)   Pulse 84   Temp 99.1 F (37.3 C) (Oral)   Resp 18   Ht 5\' 1"  (1.549 m)   Wt 51.7 kg   SpO2 100%   BMI 21.54 kg/m  Constitutional: No distress . Vital signs reviewed. HEENT: EOMI, oral membranes moist Neck: supple Cardiovascular: RRR without murmur. No JVD    Respiratory: CTA Bilaterally without wheezes or rales. Normal effort    GI: BS +, non-tender, non-distended  Skin: scalp incision CDI Psych: more alert. Distracted.  Musc: No edema in extremities.  No tenderness in extremities. Neurological: oriented to person. Thinks she's in [place], despite being re-oriented. left inattention. Decreased pain sense left arm and leg. LUE and LLE 0/5--no volitional movement in either (although leg moving with therapy). No resting tone  Assessment/Plan: 1. Functional deficits secondary to subarachnoid hemorrhage/hematoma status post right frontotemporal craniotomy which require 3+ hours per day of interdisciplinary therapy in a comprehensive inpatient rehab setting.  Physiatrist is providing close team supervision and 24 hour management of active medical problems listed below.  Physiatrist and rehab team continue to assess barriers to discharge/monitor patient progress toward  functional and medical goals  Care Tool:  Bathing    Body parts bathed by patient: Chest, Abdomen, Front perineal area, Right upper leg, Left upper leg, Face, Left arm   Body parts bathed by helper: Right arm, Right lower leg, Left lower leg, Buttocks     Bathing assist Assist Level: Moderate Assistance - Patient 50 - 74%     Upper Body Dressing/Undressing Upper body dressing   What is the patient wearing?: Pull over shirt    Upper body assist Assist Level: Moderate Assistance - Patient 50 - 74%    Lower Body Dressing/Undressing Lower body dressing      What is the patient wearing?: Incontinence brief, Pants     Lower body assist Assist for lower body dressing: Moderate Assistance - Patient 50 - 74%     Toileting Toileting    Toileting assist Assist for toileting: Maximal Assistance - Patient 25 - 49%     Transfers Chair/bed transfer  Transfers assist     Chair/bed transfer assist level: Moderate Assistance - Patient 50 - 74%     Locomotion Ambulation   Ambulation assist      Assist level: Maximal Assistance - Patient 25 - 49% Assistive device: Walker-rolling Max distance: 20'   Walk 10 feet activity   Assist     Assist level: Maximal Assistance - Patient 25 - 49% Assistive device: Walker-rolling   Walk 50 feet activity   Assist Walk 50 feet with 2 turns activity did not occur: Safety/medical concerns         Walk 150 feet  activity   Assist Walk 150 feet activity did not occur: Safety/medical concerns         Walk 10 feet on uneven surface  activity   Assist Walk 10 feet on uneven surfaces activity did not occur: Safety/medical concerns         Wheelchair     Assist Will patient use wheelchair at discharge?: (TBD) Type of Wheelchair: Manual    Wheelchair assist level: Minimal Assistance - Patient > 75% Max wheelchair distance: 150'    Wheelchair 50 feet with 2 turns activity    Assist        Assist  Level: Minimal Assistance - Patient > 75%   Wheelchair 150 feet activity     Assist     Assist Level: Minimal Assistance - Patient > 75%      1.  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  2.  Antithrombotics: -DVT/anticoagulation: Subcutaneous Lovenox initiated 11/22/2019.  Monitor for any bleeding episodes               -antiplatelet therapy: N/A 3. Pain Management: Oxycodone as needed 4. Mood: Xanax 1 mg every 6 hours as needed---limit as possible             -antipsychotic agents: N/A 5. Neuropsych: This patient is not capable of making decisions on her own behalf.  -1/26 begin trial of ritalin tomorrow 6. Skin/Wound Care: Routine skin checks 7. Fluids/Electrolytes/Nutrition: Routine in and outs.    -NG feeds decreased at night to help with appetite   -protein for low albumin  -changed IVF to HS only to help with therapies  -continue NGT for now as she just isn't eating consistently enough  1/23 -added megace    1/24- eating little  1/25: states that she doesn't eat much at baseline, daughter corroborates. Would like NGT removed; will discuss with SLP.  1/26: pt eating better. Doesn't eat much at baseline per above. I d'ced NGT today    -prealbumin 28 today 8. Seizure prophylaxis.   Continue Keppra 1000 mg twice daily 9.  Post stroke dysphagia:    -continue Dysphagia #1 thins liquids.   -advance per speech therapy  1/19: reduced TF by half to help promote appetite  1/26 stopped TF. Encourage PO intake as possible 10. Hypertension.  Completed course of Nimotop.                good control 1/24-27, actually a little soft 11. Hypokalemia  Potassium 3.9  1/26 12. Increased LFT's. Likely reactive  -1/26 LFT's continue to decrease. Almost normal today  13. Disposition: explained that she would not be allowed to go home and return the next day due to hospital policy. Patient expressed understanding. Advised that once ready for discharge she will be able to return home and receive home PT and OT. She is very excited to return home to spend time with her twin 57-year-old grandsons.     LOS: 11 days A FACE TO FACE EVALUATION WAS PERFORMED  Clint Bolder P Tige Meas 12/13/2019, 10:25 AM

## 2019-12-13 NOTE — Progress Notes (Signed)
Physical Therapy Session Note  Patient Details  Name: Olivia Mann MRN: [id number] Date of Birth: 1956/01/12  Today's Date: 12/13/2019 PT Individual Time: 6415-8309 PT Individual Time Calculation (min): 54 min   Short Term Goals: Week 2:  PT Short Term Goal 1 (Week 2): Pt will self-propel w/c 50' w/ min assist PT Short Term Goal 2 (Week 2): Pt will attend to visual midline during functional mobility w/ mod cues PT Short Term Goal 3 (Week 2): Pt will ambulate 58' w/ mod assist w/ LRAD  Skilled Therapeutic Interventions/Progress Updates:   Pt in supine and agreeable to therapy w/ encouragement, no c/o pain. Pt more oriented this session, aware of environment and why she was in the hospital. Min assist supine>sit and mod assist stand pivot to w/c. Total assist w/c transport to/from therapy gym. Orthotist present to assess for LAFO use. Ambulated 15-20' x4 bouts w/ mod-max assist overall. Manual assist for lateral weight shifting and LLE step placement. Pt able to initiate at hip flexors, however needed DF assist to clear foot from floor and was independent w/ LLE management w/ toe cap applied. Trial-ed PLS AFO w/ increased foot clearance, PLS also assisting w/ preventing excessive L knee hyperextension in stance. Pt needed mod assist to management RW as she would steer to R, although requesting therapist stop turning her as she was trying to stay straight, max cues for obstacle avoidance. Returned to room total assist as pt needed to toilet. Mod assist stand pivot to/from toilet w/ total assist for LE garment management. Pt continent of void. Returned to day room and worked on [employer] attention and attention to task while eating lunch. Min cues to attend to midline and mod-max cues to attend to L side of tray. Pt attended to task well w/ only min cues. Returned to room and set-up for transfer back to EOB. Pt impulsively transferred before therapist removed leg rest, min assist to safely reach bed and  transfer to supine. Ended session in supine, all needs in reach.   Therapy Documentation Precautions:  Precautions Precautions: Fall Precaution Comments: L neglect, L hemiparesis Restrictions Weight Bearing Restrictions: No  Therapy/Group: Individual Therapy  Olivia Mann 12/13/2019, 1:56 PM

## 2019-12-13 NOTE — Progress Notes (Signed)
Occupational Therapy Note  Patient Details  Name: Olivia Mann MRN: [id number] Date of Birth: 09-Feb-1956  Attempted to make up time with pt, however pt sleeping soundly, audibly snoring. Pt unaroused upon entering and knocking as well as speaking to pt. Pt left to rest in bed, exit alarm on and call light in reach   [facility] 12/13/2019, 11:42 AM

## 2019-12-13 NOTE — Progress Notes (Signed)
Occupational Therapy Session Note  Patient Details  Name: Olivia Mann MRN: [id number] Date of Birth: 04/14/1956  Today's Date: 12/13/2019 OT Individual Time: 4975-3005 OT Individual Time Calculation (min): 8 min    Short Term Goals: Week 1:  OT Short Term Goal 1 (Week 1): Pt will complete BSC transfer with Mod A OT Short Term Goal 1 - Progress (Week 1): Met OT Short Term Goal 2 (Week 1): Pt will complete LB self care with Mod A for dynamic standing balance OT Short Term Goal 2 - Progress (Week 1): Progressing toward goal OT Short Term Goal 3 (Week 1): Pt will utilize head turns towards the Lt to locate 2 items needed during ADL session with mod cuing OT Short Term Goal 3 - Progress (Week 1): Progressing toward goal  Skilled Therapeutic Interventions/Progress Updates:    1:1. Pt received in bed asleep requiring increased time to arouse. Pt supine>sitting EOB to doff half of shirt with MAX A and CGA sitting balance. OT attach saebo stim to L deltoid. Pt able to tolerate 30 min e stim unattended with skin in tact. No redness noted. Reeducation on subluxation and estim benefits. Pt verbalized understanding.   Saebo Stim One 330 pulse width 35 Hz pulse rate On 8 sec/ off 8 sec Ramp up/ down 2 sec Symmetrical Biphasic wave form  Max intensity 177m at 500 Ohm load  Exited session with pt seated in bed, exit alarm on and call light in reach  Therapy Documentation Precautions:  Precautions Precautions: Fall Precaution Comments: L neglect, L hemiparesis Restrictions Weight Bearing Restrictions: No General:   Vital Signs: Therapy Vitals Temp: 98.5 F (36.9 C) Pulse Rate: (!) 102 Resp: 20 BP: 108/78 Oxygen Therapy SpO2: 100 % Pain:   ADL: ADL Eating: Not assessed Grooming: Maximal assistance Where Assessed-Grooming: Edge of bed Upper Body Bathing: Moderate assistance Where Assessed-Upper Body Bathing: Edge of bed Lower Body Bathing: Maximal assistance Where  Assessed-Lower Body Bathing: Edge of bed Upper Body Dressing: Maximal assistance Where Assessed-Upper Body Dressing: Edge of bed Lower Body Dressing: Maximal assistance Where Assessed-Lower Body Dressing: Edge of bed Toileting: Not assessed Toilet Transfer: Maximal assistance Toilet Transfer Method: SArts development officer BRadiographer, therapeutic Not assessed Vision   Perception    Praxis   Exercises:   Other Treatments:     Therapy/Group: Individual Therapy  STonny Branch1/27/2021, 4:03 PM

## 2019-12-13 NOTE — Plan of Care (Signed)
  Problem: Consults Goal: RH GENERAL PATIENT EDUCATION Description: See Patient Education module for education specifics. Outcome: Progressing Goal: Skin Care Protocol Initiated - if Braden Score 18 or less Description: If consults are not indicated, leave blank or document N/A Outcome: Progressing Goal: Nutrition Consult-if indicated Outcome: Progressing   Problem: RH BOWEL ELIMINATION Goal: RH STG MANAGE BOWEL WITH ASSISTANCE Description: STG Manage Bowel with min Assistance. Outcome: Progressing   Problem: RH BLADDER ELIMINATION Goal: RH STG MANAGE BLADDER WITH ASSISTANCE Description: STG Manage Bladder With min Assistance Outcome: Progressing   Problem: RH SAFETY Goal: RH STG ADHERE TO SAFETY PRECAUTIONS W/ASSISTANCE/DEVICE Description: STG Adhere to Safety Precautions With cues and reminders Assistance/Device. Outcome: Progressing Goal: RH STG DECREASED RISK OF FALL WITH ASSISTANCE Description: STG Decreased Risk of Fall With cues and reminders Assistance. Outcome: Progressing   Problem: RH PAIN MANAGEMENT Goal: RH STG PAIN MANAGED AT OR BELOW PT'S PAIN GOAL Description: Pain level less that 4 on scale of 0-10 Outcome: Progressing   Problem: RH KNOWLEDGE DEFICIT GENERAL Goal: RH STG INCREASE KNOWLEDGE OF SELF CARE AFTER HOSPITALIZATION Description: Pt will be able to adhere to medication regimen, dietary restrictions and lifestyle modification to prevent complications with mod I assist from family.  Outcome: Progressing

## 2019-12-13 NOTE — Progress Notes (Signed)
Speech Language Pathology Daily Session Note  Patient Details  Name: Olivia Mann MRN: [id number] Date of Birth: 1956-03-10  Today's Date: 12/13/2019 SLP Individual Time: 0915-0955 SLP Individual Time Calculation (min): 40 min  Short Term Goals: Week 2: SLP Short Term Goal 1 (Week 2): Pt will consume therapeutic trials of dys 2 textures with min cues for use of swallowing precautions and minimal overt s/s of aspiration over 3 consecutive sessions prior to advancement. SLP Short Term Goal 2 (Week 2): Pt will locate items at midline during functional tasks with Max assist verbal and visual cues in 25% of opportunities. SLP Short Term Goal 3 (Week 2): Pt will sustain her attention to basic, familiar tasks for 3 minutes with mod verbal cues for redirection. SLP Short Term Goal 4 (Week 2): Pt will complete basic, familiar tasks wtih mod assist for functional problem solving  Skilled Therapeutic Interventions: Skilled treatment session focused on dysphagia and cognitive goals. SLP facilitated session by providing trials of Dys. 2 textures. Patient demonstrated efficient mastication with complete oral clearance with intermittent overt s/s of aspiration. However, patient with baseline coughing prior to trials. Recommend trial tray prior to upgrade. Patient demonstrated increased sustained attention to task, intellectual awareness of deficits and increased ability to scan to midline requiring overall Min A verbal cues during functional tasks. Patient left upright in wheelchair at RN station with alarm on. Continue with current plan of care.      Pain No/Denies Pain   Therapy/Group: Individual Therapy  Samatha Anspach 12/13/2019, 11:04 AM

## 2019-12-13 NOTE — Progress Notes (Signed)
Occupational Therapy Session Note  Patient Details  Name: Olivia Mann MRN: [id number] Date of Birth: 07-02-1956  Today's Date: 12/13/2019 OT Individual Time: 1034-1100 OT Individual Time Calculation (min): 26 min    Short Term Goals: Week 2:  OT Short Term Goal 1 (Week 2): Pt will utilize compensatory methods to locate 2 ADL items at midline with mod cueing OT Short Term Goal 2 (Week 2): Pt will don shirt with min A OT Short Term Goal 3 (Week 2): Pt will complete 2/3 toileting tasks with mod A  Skilled Therapeutic Interventions/Progress Updates:    Pt received at RN station and is agreeable to OT intervention. No c/o pain this session. Pt seated in wheelchair and assisted into day room. Pt seated for table top task with 3 different shapes and colors placed to the L and pt having to visually scan and verbalize description of shapes. Pt then needing max multimodal cuing for attention , scanning, and sequencing to re-create this pattern. Pt perseverating on going home and needing cuing for redirection as well. Attempted self ROM for pt to attend to L UE but often needing hand over hand assistance for proper technique and attention to task. Pt assisted back to room and transferred stand pivot transfer from wheelchair > bed with mod A. Pt Bed alarm activated and lowered. Call bell and all needed items within reach.   Therapy Documentation Precautions:  Precautions Precautions: Fall Precaution Comments: L neglect, L hemiparesis Restrictions Weight Bearing Restrictions: No ADL: ADL Eating: Not assessed Grooming: Maximal assistance Where Assessed-Grooming: Edge of bed Upper Body Bathing: Moderate assistance Where Assessed-Upper Body Bathing: Edge of bed Lower Body Bathing: Maximal assistance Where Assessed-Lower Body Bathing: Edge of bed Upper Body Dressing: Maximal assistance Where Assessed-Upper Body Dressing: Edge of bed Lower Body Dressing: Maximal assistance Where Assessed-Lower Body  Dressing: Edge of bed Toileting: Not assessed Toilet Transfer: Maximal assistance Toilet Transfer Method: Surveyor, minerals: Animator Transfer: Not assessed   Therapy/Group: Individual Therapy  Alen Bleacher 12/13/2019, 12:25 PM

## 2019-12-13 NOTE — Progress Notes (Signed)
Occupational Therapy Session Note  Patient Details  Name: Olivia Mann MRN: [id number] Date of Birth: 1956/04/08  Today's Date: 12/13/2019 OT Individual Time: 2035-5974 OT Individual Time Calculation (min): 75 min    Short Term Goals: Week 2:  OT Short Term Goal 1 (Week 2): Pt will utilize compensatory methods to locate 2 ADL items at midline with mod cueing OT Short Term Goal 2 (Week 2): Pt will don shirt with min A OT Short Term Goal 3 (Week 2): Pt will complete 2/3 toileting tasks with mod A  Skilled Therapeutic Interventions/Progress Updates:    Pt received supine with no c/o pain, agreeable to session. Pt completed bed mobility with mod cueing for L attention and to bring L UE/LE off bed as well. Pt required mod facilitation to bring L UE into midline. Pt completed stand pivot transfer to the w/c toward the L with mod A. Pt able to scoot back in the chair with min cueing and CGA. Pt completed oral care at the sink with mod cueing for locating in L visual field and max HOH for LUE to hold toothbrush while pt applied toothpaste with RUE. Pt requested to use the bathroom and her w/c was brought in. Pt completed stand pivot with min A, requiring max A for clothing management. Mod A for static standing balance when removing RUE from grab bar to complete peri hygiene after voiding urine. Pt threaded pants over her LLE with max facilitation and over her RLE with CGA for balance. During ADLs pt has improved L body awareness- attending to L side without cueing to apply lotion and deodorant. Focus on HOH inclusion of the LUE during ADLs.  Pt was set up to eat breakfast with focus on L attention/scanning to preferred food items on the tray. Min-mod cueing throughout for L scanning/compensatory head turning. Pt coughing throughout meal and increased cueing provided to follow SLP established guidelines.  In the therapy gym pt completed standing level functional reaching with L scanning component. Pt required  mod A to remain stnading and for L knee blocking with use of RW. Pt was left sitting up at the nurses desk with chair alarm belt fastened.   Therapy Documentation Precautions:  Precautions Precautions: Fall Precaution Comments: L neglect, L hemiparesis Restrictions Weight Bearing Restrictions: No   Therapy/Group: Individual Therapy  [name] 12/13/2019, 6:57 AM

## 2019-12-14 ENCOUNTER — Inpatient Hospital Stay (HOSPITAL_COMMUNITY): Payer: Self-pay | Admitting: Physical Therapy

## 2019-12-14 ENCOUNTER — Inpatient Hospital Stay (HOSPITAL_COMMUNITY): Payer: Self-pay | Admitting: Speech Pathology

## 2019-12-14 ENCOUNTER — Inpatient Hospital Stay (HOSPITAL_COMMUNITY): Payer: Self-pay

## 2019-12-14 LAB — GLUCOSE, CAPILLARY
Glucose-Capillary: 101 mg/dL — ABNORMAL HIGH (ref 70–99)
Glucose-Capillary: 90 mg/dL (ref 70–99)
Glucose-Capillary: 90 mg/dL (ref 70–99)
Glucose-Capillary: 92 mg/dL (ref 70–99)
Glucose-Capillary: 93 mg/dL (ref 70–99)
Glucose-Capillary: 99 mg/dL (ref 70–99)
Glucose-Capillary: 99 mg/dL (ref 70–99)

## 2019-12-14 NOTE — Progress Notes (Signed)
Physical Therapy Session Note  Patient Details  Name: Olivia Mann MRN: [id number] Date of Birth: 12/31/1955  Today's Date: 12/14/2019 PT Individual Time: 1040-1140 PT Individual Time Calculation (min): 60 min   Short Term Goals: Week 2:  PT Short Term Goal 1 (Week 2): Pt will self-propel w/c 50' w/ min assist PT Short Term Goal 2 (Week 2): Pt will attend to visual midline during functional mobility w/ mod cues PT Short Term Goal 3 (Week 2): Pt will ambulate 49' w/ mod assist w/ LRAD  Skilled Therapeutic Interventions/Progress Updates:   Pt in supine and agreeable to therapy w/ encouragement, no c/o pain. Supine>sit w/ min assist. Donned shoes and LAFO w/ total assist. Stand pivot to w/c on L side w/ mod assist. Worked on w/c mobility when transitioning in between activities. Pt self-propelled w/c via R hemi technique w/ min assist overall, 100-150' bouts. She continues to need min cues for attention to task, L environmental and obstacle awareness, and problem solving. Worked on gait training and LLE NMR. Ambulated 20' x2 w/ RW, LUE orthosis, LAFO, and L toe cap. Mod-max assist overall for lateral weight shifting. Pt unable to complete adequate weight shift onto R side w/ L swing. Verbal and visual cues for increased L foot clearance, no assist needed for L knee management. Worked on R weight shifting w/ RUE reaching tasks requiring pt to completely unweight LLE. Performed w/ min-mod assist for balance and tactile cues for L quad activation w/ squatting to reach for clothpins prior to pinning on basketball hoop on far R side. NuStep 5 min x2 reps @ level 1 w/ BLEs only w/ emphasis on LLE activation and attention to task. Needed max-total assist to maintain neutral LLE alignment and frequent verbal and tactile cues to completely extend LLE. Strong quad and HS activation palpated. Pt self-propelled w/c back to room. Min assist stand pivot to EOB. Ended session in supine, all needs in reach.   Therapy  Documentation Precautions:  Precautions Precautions: Fall Precaution Comments: L neglect, L hemiparesis Restrictions Weight Bearing Restrictions: No  Therapy/Group: Individual Therapy  Amere Bricco Melton Krebs 12/14/2019, 11:45 AM

## 2019-12-14 NOTE — Progress Notes (Signed)
Speech Language Pathology Daily Session Note  Patient Details  Name: Olivia Mann MRN: [id number] Date of Birth: [birth year]  Today's Date: 12/14/2019 SLP Individual Time: 1355-1450 SLP Individual Time Calculation (min): 55 min  Short Term Goals: Week 2: SLP Short Term Goal 1 (Week 2): Pt will consume therapeutic trials of dys 2 textures with min cues for use of swallowing precautions and minimal overt s/s of aspiration over 3 consecutive sessions prior to advancement. SLP Short Term Goal 2 (Week 2): Pt will locate items at midline during functional tasks with Max assist verbal and visual cues in 25% of opportunities. SLP Short Term Goal 3 (Week 2): Pt will sustain her attention to basic, familiar tasks for 3 minutes with mod verbal cues for redirection. SLP Short Term Goal 4 (Week 2): Pt will complete basic, familiar tasks wtih mod assist for functional problem solving  Skilled Therapeutic Interventions: Skilled treatment session focused on dysphagia and cognitive goals. SLP facilitated session by providing trials of Dys. 2 textures. Patient demonstrated mildly prolonged mastication and complete oral clearance with intermittent liquid washes. Recommend trial tray with lunch prior to upgrade. SLP also facilitated session by providing Mod A verbal and visual cues for visual scanning to the left field of environment during a basic card task. Patient was overall Mod I to sustain attention to task for ~20 minutes. Patient transferred back to bed at end of session and left with alarm on and all needs within reach. Continue with current plan of care.       Pain No/Denies Pain   Therapy/Group: Individual Therapy  Mory Herrman 12/14/2019, 3:33 PM

## 2019-12-14 NOTE — Plan of Care (Signed)
  Problem: Consults Goal: RH GENERAL PATIENT EDUCATION Description: See Patient Education module for education specifics. Outcome: Progressing Goal: Skin Care Protocol Initiated - if Braden Score 18 or less Description: If consults are not indicated, leave blank or document N/A Outcome: Progressing Goal: Nutrition Consult-if indicated Outcome: Progressing   Problem: RH BOWEL ELIMINATION Goal: RH STG MANAGE BOWEL WITH ASSISTANCE Description: STG Manage Bowel with min Assistance. Outcome: Progressing   Problem: RH BLADDER ELIMINATION Goal: RH STG MANAGE BLADDER WITH ASSISTANCE Description: STG Manage Bladder With min Assistance Outcome: Progressing   Problem: RH SAFETY Goal: RH STG ADHERE TO SAFETY PRECAUTIONS W/ASSISTANCE/DEVICE Description: STG Adhere to Safety Precautions With cues and reminders Assistance/Device. Outcome: Progressing Goal: RH STG DECREASED RISK OF FALL WITH ASSISTANCE Description: STG Decreased Risk of Fall With cues and reminders Assistance. Outcome: Progressing   Problem: RH PAIN MANAGEMENT Goal: RH STG PAIN MANAGED AT OR BELOW PT'S PAIN GOAL Description: Pain level less that 4 on scale of 0-10 Outcome: Progressing   Problem: RH KNOWLEDGE DEFICIT GENERAL Goal: RH STG INCREASE KNOWLEDGE OF SELF CARE AFTER HOSPITALIZATION Description: Pt will be able to adhere to medication regimen, dietary restrictions and lifestyle modification to prevent complications with mod I assist from family.  Outcome: Progressing   

## 2019-12-14 NOTE — Progress Notes (Signed)
Social Work Patient ID: Olivia Mann, female   DOB: 1956-01-24, 64 y.o.   MRN: [id number]  Met yesterday afternoon with pt, daughter [name]) and other daughter on Facetime.  All aware that targeted d/c date set for 2/10 and CGA/min assist goals overall, however, pt insists she is ready for d/c NOW.  Daughter redirects well.  Will need to coordinate f/u and DME for [place] d/c.  Continue to follow.  [name], LCSW

## 2019-12-14 NOTE — Progress Notes (Signed)
Occupational Therapy Session Note  Patient Details  Name: Olivia Mann MRN: [id number] Date of Birth: 05-Apr-1956  Today's Date: 12/14/2019 OT Individual Time: 0900-1000 OT Individual Time Calculation (min): 60 min    Short Term Goals: Week 1:  OT Short Term Goal 1 (Week 1): Pt will complete BSC transfer with Mod A OT Short Term Goal 1 - Progress (Week 1): Met OT Short Term Goal 2 (Week 1): Pt will complete LB self care with Mod A for dynamic standing balance OT Short Term Goal 2 - Progress (Week 1): Progressing toward goal OT Short Term Goal 3 (Week 1): Pt will utilize head turns towards the Lt to locate 2 items needed during ADL session with mod cuing OT Short Term Goal 3 - Progress (Week 1): Progressing toward goal  Skilled Therapeutic Interventions/Progress Updates:    1;1. Pt received in bed agreeable to wash up and get dressed. Pt completes stand pivot transfer with MIN A and no AD to w/c to L. Pt completes bathing at sink for UB and HOH A to wash RUE for NMR of LUE. Pt able to sequence and terminate more appropiately this date. Pt completes LB bathing seated on toilet per pt prefernce. With MIN A sit to stand with MOD A for standing balnce and keeping LLE from hyperextending back. Pt LB dresses with MIN A overall for threading LLE and standing balance. OT installs shoe buttons and pt able to fasten R shoe. Pt grooms seated at sink with MIN A d/t time management. Focus of session on L attention with all ADL items on L with decreased cuing required this session to locate all items. Exited session with pt setaed in bed and saebo stim placed on L deltoid. Trace scap elevation noted today!  Saebo Stim One 330 pulse width 35 Hz pulse rate On 8 sec/ off 8 sec Ramp up/ down 2 sec Symmetrical Biphasic wave form  Max intensity 178m at 500 Ohm load  Exited session with pt seated in bed, exit alarmon, call light in reach and all needs met   Therapy Documentation Precautions:   Precautions Precautions: Fall Precaution Comments: L neglect, L hemiparesis Restrictions Weight Bearing Restrictions: No General:   Vital Signs:   Pain:   ADL: ADL Eating: Not assessed Grooming: Maximal assistance Where Assessed-Grooming: Edge of bed Upper Body Bathing: Moderate assistance Where Assessed-Upper Body Bathing: Edge of bed Lower Body Bathing: Maximal assistance Where Assessed-Lower Body Bathing: Edge of bed Upper Body Dressing: Maximal assistance Where Assessed-Upper Body Dressing: Edge of bed Lower Body Dressing: Maximal assistance Where Assessed-Lower Body Dressing: Edge of bed Toileting: Not assessed Toilet Transfer: Maximal assistance Toilet Transfer Method: SArts development officer BRadiographer, therapeutic Not assessed Vision   Perception    Praxis   Exercises:   Other Treatments:     Therapy/Group: Individual Therapy  STonny Branch1/28/2021, 9:23 AM

## 2019-12-14 NOTE — Progress Notes (Signed)
[facility] PHYSICAL MEDICINE & REHABILITATION PROGRESS NOTE  Subjective/Complaints: Up in bed. Says that room is always cold. Eating well. Wants to go home to get her "onesie"  ROS: Limited due to cognitive/behavioral    Objective: Vital Signs: Blood pressure 132/84, pulse 78, temperature 98.1 F (36.7 C), temperature source Tympanic, resp. rate 16, height 5\' 1"  (1.549 m), weight 55.3 kg, SpO2 100 %. No results found. No results for input(s): WBC, HGB, HCT, PLT in the last 72 hours. Recent Labs    12/12/19 0546  NA 138  K 3.9  CL 103  CO2 24  GLUCOSE 95  BUN 16  CREATININE 0.63  CALCIUM 9.8    Physical Exam: BP 132/84 (BP Location: Right Arm)   Pulse 78   Temp 98.1 F (36.7 C) (Tympanic)   Resp 16   Ht 5\' 1"  (1.549 m)   Wt 55.3 kg   SpO2 100%   BMI 23.05 kg/m  Constitutional: No distress . Vital signs reviewed. HEENT: EOMI, oral membranes moist Neck: supple Cardiovascular: RRR without murmur. No JVD    Respiratory: CTA Bilaterally without wheezes or rales. Normal effort    GI: BS +, non-tender, non-distended  Skin: scalp incision CDI Psych: more alert. Remains distracted.  Musc: No edema in extremities.  No tenderness in extremities. Neurological: oriented to person. Remembers my name. left inattention. Decreased pain sense left arm and leg. LUE and LLE 0/5--no volitional movement in either (although leg moving with therapy). No resting tone  Assessment/Plan: 1. Functional deficits secondary to subarachnoid hemorrhage/hematoma status post right frontotemporal craniotomy which require 3+ hours per day of interdisciplinary therapy in a comprehensive inpatient rehab setting.  Physiatrist is providing close team supervision and 24 hour management of active medical problems listed below.  Physiatrist and rehab team continue to assess barriers to discharge/monitor patient progress toward functional and medical goals  Care Tool:  Bathing    Body parts bathed by  patient: Chest, Abdomen, Front perineal area, Right upper leg, Left upper leg, Face, Left arm   Body parts bathed by helper: Right arm, Right lower leg, Left lower leg, Buttocks     Bathing assist Assist Level: Moderate Assistance - Patient 50 - 74%     Upper Body Dressing/Undressing Upper body dressing   What is the patient wearing?: Pull over shirt    Upper body assist Assist Level: Moderate Assistance - Patient 50 - 74%    Lower Body Dressing/Undressing Lower body dressing      What is the patient wearing?: Incontinence brief, Pants     Lower body assist Assist for lower body dressing: Moderate Assistance - Patient 50 - 74%     Toileting Toileting    Toileting assist Assist for toileting: Maximal Assistance - Patient 25 - 49%     Transfers Chair/bed transfer  Transfers assist     Chair/bed transfer assist level: Moderate Assistance - Patient 50 - 74%     Locomotion Ambulation   Ambulation assist      Assist level: Maximal Assistance - Patient 25 - 49% Assistive device: Walker-rolling Max distance: 15'   Walk 10 feet activity   Assist     Assist level: Maximal Assistance - Patient 25 - 49% Assistive device: Walker-rolling   Walk 50 feet activity   Assist Walk 50 feet with 2 turns activity did not occur: Safety/medical concerns         Walk 150 feet activity   Assist Walk 150 feet activity did not occur:  Safety/medical concerns         Walk 10 feet on uneven surface  activity   Assist Walk 10 feet on uneven surfaces activity did not occur: Safety/medical concerns         Wheelchair     Assist Will patient use wheelchair at discharge?: (TBD) Type of Wheelchair: Manual    Wheelchair assist level: Minimal Assistance - Patient > 75% Max wheelchair distance: 150'    Wheelchair 50 feet with 2 turns activity    Assist        Assist Level: Minimal Assistance - Patient > 75%   Wheelchair 150 feet activity      Assist     Assist Level: Minimal Assistance - Patient > 75%      1.  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   -reviewed need for ongoing therapy. To improve balance/cognition/mobility etc. Insight still poor 2.  Antithrombotics: -DVT/anticoagulation: Subcutaneous Lovenox initiated 11/22/2019.  Monitor for any bleeding episodes               -antiplatelet therapy: N/A 3. Pain Management: Oxycodone as needed 4. Mood: Xanax 1 mg every 6 hours as needed---limit as possible             -antipsychotic agents: N/A 5. Neuropsych: This patient is not capable of making decisions on her own behalf.  -1/27 continue ritalin trial 6. Skin/Wound Care: Routine skin checks 7. Fluids/Electrolytes/Nutrition: Routine in and outs.    -NG feeds decreased at night to help with appetite   -protein for low albumin  -changed IVF to HS only to help with therapies  -continue NGT for now as she just isn't eating consistently enough  1/23 -added megace    1/24- eating little  1/25: states that she doesn't eat much at baseline, daughter corroborates. Would like NGT removed; will discuss with SLP.  1/28 eating well. NGT out since Tuesday. 8. Seizure prophylaxis.   Continue Keppra 1000 mg twice daily 9.  Post stroke dysphagia:    -continue Dysphagia #1 thins liquids.   -advance per speech therapy  -on PO alone currently 10. Hypertension.  Completed course of Nimotop.                good control 1/24-28  11. Hypokalemia  Potassium 3.9 1/26 12. Increased LFT's. Likely reactive  -1/26 LFT's continue to decrease.           LOS: 12 days A FACE TO FACE EVALUATION WAS PERFORMED  Meredith Staggers 12/14/2019, 11:14  AM

## 2019-12-15 ENCOUNTER — Inpatient Hospital Stay (HOSPITAL_COMMUNITY): Payer: Self-pay | Admitting: Occupational Therapy

## 2019-12-15 ENCOUNTER — Inpatient Hospital Stay (HOSPITAL_COMMUNITY): Payer: Self-pay | Admitting: Speech Pathology

## 2019-12-15 ENCOUNTER — Inpatient Hospital Stay (HOSPITAL_COMMUNITY): Payer: Self-pay | Admitting: Physical Therapy

## 2019-12-15 LAB — GLUCOSE, CAPILLARY
Glucose-Capillary: 127 mg/dL — ABNORMAL HIGH (ref 70–99)
Glucose-Capillary: 84 mg/dL (ref 70–99)
Glucose-Capillary: 89 mg/dL (ref 70–99)
Glucose-Capillary: 109 mg/dL — ABNORMAL HIGH (ref 70–99)
Glucose-Capillary: 104 mg/dL — ABNORMAL HIGH (ref 70–99)

## 2019-12-15 MED ORDER — INSULIN ASPART 100 UNIT/ML ~~LOC~~ SOLN: 0.0000 [IU] | Freq: Three times a day (TID) | SUBCUTANEOUS | Status: DC

## 2019-12-15 NOTE — Progress Notes (Signed)
Occupational Therapy Session Note  Patient Details  Name: Olivia Mann MRN: [id number] Date of Birth: 1956-04-08  Today's Date: 12/15/2019 OT Individual Time: 2542-7062 OT Individual Time Calculation (min): 34 min    Short Term Goals: Week 2:  OT Short Term Goal 1 (Week 2): Pt will utilize compensatory methods to locate 2 ADL items at midline with mod cueing OT Short Term Goal 2 (Week 2): Pt will don shirt with min A OT Short Term Goal 3 (Week 2): Pt will complete 2/3 toileting tasks with mod A  Skilled Therapeutic Interventions/Progress Updates:    Patient asleep in bed, easily aroused and happy to participate in therapy session.   Supine to siting with min A.  Dependent to donn bilateral shoes and left AFO.  SPT to w/c with mod A.  SPT to mat table with mod A.  Completed left UE AAROM/proximal facilitation with positioning and weight bearing - noted scapular and shoulder activation, no volitional motion noted at elbow/wrist/hand.  She is able to reproduce shoulder mobility seated in w/c with lap tray.  Completed standing weight shift, left LE motor control and stepping with right min A for balance and facilitation of left at hip.  Returned to w/c mod A.  She remained siting in w/c at close of session, seat belt alarm set, telesitter in place, call bell in hand.   Therapy Documentation Precautions:  Precautions Precautions: Fall Precaution Comments: L neglect, L hemiparesis Restrictions Weight Bearing Restrictions: No General:   Vital Signs: Therapy Vitals Temp: 98.6 F (37 C) Pulse Rate: 99 Resp: 18 BP: 132/90 Patient Position (if appropriate): Sitting Oxygen Therapy SpO2: 100 % O2 Device: Room Air Pain: Pain Assessment Pain Scale: 0-10 Pain Score: 0-No pain   Other Treatments:     Therapy/Group: Individual Therapy  Barrie Lyme 12/15/2019, 3:42 PM

## 2019-12-15 NOTE — Progress Notes (Signed)
Occupational Therapy Session Note  Patient Details  Name: Olivia Mann MRN: [id number] Date of Birth: [birth year]  Today's Date: 12/15/2019 OT Individual Time: 5035-4656 OT Individual Time Calculation (min): 60 min    Short Term Goals: Week 2:  OT Short Term Goal 1 (Week 2): Pt will utilize compensatory methods to locate 2 ADL items at midline with mod cueing OT Short Term Goal 2 (Week 2): Pt will don shirt with min A OT Short Term Goal 3 (Week 2): Pt will complete 2/3 toileting tasks with mod A  Skilled Therapeutic Interventions/Progress Updates:    Treatment session with focus on sustained attention to task, transfers, Lt attention, and awareness during self-care tasks.  Pt received upright in w/c expressing desire to complete self-care tasks.  Pt initially to wash at sink, however reports need to toilet.  Therefore completed toileting tasks and LB hygiene while on toilet.  Pt then requested to complete LB dressing at sit > stand level in bathroom.  Mod assist for sit > stand and to assist with threading LLE.  Mod assist transfer back to w/c.  Completed grooming tasks and UB bathing and dressing at sink with min cues for sustain attention to task. Engaged in table top activity with focus on visual scanning to Lt to complete pattern with peg board.  Pt able to recognize horizontal pattern, but unable to replicate despite max cues.  Returned to room and left upright in w/c with seatbelt alarm on and nurse tech arriving to provide supervision with breakfast.  Therapy Documentation Precautions:  Precautions Precautions: Fall Precaution Comments: L neglect, L hemiparesis Restrictions Weight Bearing Restrictions: No Pain:  Pt with no c/o pain   Therapy/Group: Individual Therapy  Rosalio Loud 12/15/2019, 12:36 PM

## 2019-12-15 NOTE — Progress Notes (Signed)
Physical Therapy Weekly Progress Note  Patient Details  Name: Olivia Mann MRN: [id number] Date of Birth: 1956/10/22  Beginning of progress report period: December 08, 2019 End of progress report period: December 15, 2019  Today's Date: 12/15/2019 PT Individual Time: 3267-1245 PT Individual Time Calculation (min): 53 min   Patient has met 2 of 3 short term goals. Pt continues to make steady progress towards LTGs. She is performing bed mobility and transfers w/ min-mod assist. However continues to require up to max assist for gait 2/2 impaired postural control, impaired attention to task, and impulsivity.   Patient continues to demonstrate the following deficits muscle weakness, decreased cardiorespiratoy endurance, impaired timing and sequencing, unbalanced muscle activation, decreased coordination and decreased motor planning, decreased attention to left, decreased attention, decreased awareness, decreased problem solving, decreased safety awareness and decreased memory and decreased sitting balance, decreased standing balance, decreased postural control, hemiplegia and decreased balance strategies and therefore will continue to benefit from skilled PT intervention to increase functional independence with mobility.  Patient not progressing toward long term goals.  See goal revision..  Plan of care revisions: Gait goal downgraded to min assist, anticipate pt will be non-ambulatory w/ caregiver upon d/c 2/2 significant skilled cues required for safe ambulation. Goals downgraded to min assist overall for upright mobility. W/c goal added at supervision level.   PT Short Term Goals Week 2:  PT Short Term Goal 1 (Week 2): Pt will self-propel w/c 50' w/ min assist PT Short Term Goal 1 - Progress (Week 2): Met PT Short Term Goal 2 (Week 2): Pt will attend to visual midline during functional mobility w/ mod cues PT Short Term Goal 2 - Progress (Week 2): Met PT Short Term Goal 3 (Week 2): Pt will ambulate  30' w/ mod assist w/ LRAD PT Short Term Goal 3 - Progress (Week 2): Progressing toward goal Week 3:  PT Short Term Goal 1 (Week 3): Pt will initiate stair training PT Short Term Goal 2 (Week 3): Pt will ambulate 29' w/ mod assist w/ LRAD PT Short Term Goal 3 (Week 3): Pt will maintain dynamic standing balance w/ mod assist consistently  Skilled Therapeutic Interventions/Progress Updates:   Pt received in supine, daughter Tia present. Pt agreeable to therapy and no c/o pain. Pt very perseverative on being able to leave hospital and go to store. States she needs to get cash from store to pay her daughter for bills. Pt's daughter reassuring her regarding bill management, but pt not easily redirected throughout session, needed constant reassurance. Ortho rep present to assess PLS w/ gait. Ambulated 15' w/ mod-max assist, improved foot clearance this session and PLS higher up behind pt's gastroc to assist w/ foot clearance and knee control. Ortho rep and therapist educated pt's daughter on donning/doffing AFO w/ shoe and recommend daughter bring in shoes 1 size bigger, daughter agreeable. Pt and daughter both asking if pt can d/c earlier than 2/10. Dicussed that pt would be safe to d/c at this level w/ extensive caregiver education, 24/7 close supervision 2/2 pt impulsivity, and pt remaining w/c level at all times. Daughter in agreement however encouraging pt to remain admitted to CIR to maximize her return of function. Daughter and therapist made compromise w/ pt to stay at least 1 more week on rehab and pt agreeable to this. Will make treatment team aware. Total assist w/c transport to therapy gym. Worked on static standing balance while standing to match playing cards in midline visual field. Min assist sit>stand.  Mod-max assist static balance, pt w/ very poor attention to LLE today, needing constant verbal and tactile cues to reach neutral L knee and hip extension. Posture and static balance did improve to  min assist w/ mirror for visual feedback very briefly. Performed multiple bouts of standing w/ verbal and tactile cues for neutral, upright posture. Pt self-propelled w/c back to room w/ min assist and verbal/visual cues for L environmental/obstacle awareness. Ended session in supine, all needs in reach.   Therapy Documentation Precautions:  Precautions Precautions: Fall Precaution Comments: L neglect, L hemiparesis Restrictions Weight Bearing Restrictions: No  Therapy/Group: Individual Therapy  Jaclyn Andy Clent Demark 12/15/2019, 2:37 PM

## 2019-12-15 NOTE — Plan of Care (Signed)
  Problem: RH Balance Goal: LTG Patient will maintain dynamic standing balance (PT) Description: LTG:  Patient will maintain dynamic standing balance with assistance during mobility activities (PT) Flowsheets (Taken 12/15/2019 1246) LTG: Pt will maintain dynamic standing balance during mobility activities with:: (goal downgraded 1/29 due to lack of progress - AAT) Minimal Assistance - Patient > 75% Note: goal downgraded 1/29 due to lack of progress - AAT   Problem: RH Car Transfers Goal: LTG Patient will perform car transfers with assist (PT) Description: LTG: Patient will perform car transfers with assistance (PT). Flowsheets (Taken 12/15/2019 1246) LTG: Pt will perform car transfers with assist:: (goal downgraded 1/29 due to lack of progress - AAT) Minimal Assistance - Patient > 75% Note: goal downgraded 1/29 due to lack of progress - AAT   Problem: RH Ambulation Goal: LTG Patient will ambulate in controlled environment (PT) Description: LTG: Patient will ambulate in a controlled environment, # of feet with assistance (PT). Flowsheets (Taken 12/15/2019 1246) LTG: Pt will ambulate in controlled environ  assist needed:: (goal downgraded 1/29 due to lack of progress - AAT) Minimal Assistance - Patient > 75% LTG: Ambulation distance in controlled environment: goal downgraded 1/29 due to lack of progress - AAT Goal: LTG Patient will ambulate in home environment (PT) Description: LTG: Patient will ambulate in home environment, # of feet with assistance (PT). Outcome: Not Applicable Flowsheets (Taken 12/15/2019 1246) LTG: Pt will ambulate in home environ  assist needed:: (goal discontinued 1/29 due to lack of progress - AAT) -- Note: goal discontinued 1/29 due to lack of progress - AAT

## 2019-12-15 NOTE — Plan of Care (Signed)
  Problem: Consults Goal: RH GENERAL PATIENT EDUCATION Description: See Patient Education module for education specifics. Outcome: Progressing Goal: Skin Care Protocol Initiated - if Braden Score 18 or less Description: If consults are not indicated, leave blank or document N/A Outcome: Progressing Goal: Nutrition Consult-if indicated Outcome: Progressing   Problem: RH BOWEL ELIMINATION Goal: RH STG MANAGE BOWEL WITH ASSISTANCE Description: STG Manage Bowel with min Assistance. Outcome: Progressing   Problem: RH BLADDER ELIMINATION Goal: RH STG MANAGE BLADDER WITH ASSISTANCE Description: STG Manage Bladder With min Assistance Outcome: Progressing   Problem: RH SAFETY Goal: RH STG ADHERE TO SAFETY PRECAUTIONS W/ASSISTANCE/DEVICE Description: STG Adhere to Safety Precautions With cues and reminders Assistance/Device. Outcome: Progressing Goal: RH STG DECREASED RISK OF FALL WITH ASSISTANCE Description: STG Decreased Risk of Fall With cues and reminders Assistance. Outcome: Progressing   Problem: RH PAIN MANAGEMENT Goal: RH STG PAIN MANAGED AT OR BELOW PT'S PAIN GOAL Description: Pain level less that 4 on scale of 0-10 Outcome: Progressing   Problem: RH KNOWLEDGE DEFICIT GENERAL Goal: RH STG INCREASE KNOWLEDGE OF SELF CARE AFTER HOSPITALIZATION Description: Pt will be able to adhere to medication regimen, dietary restrictions and lifestyle modification to prevent complications with mod I assist from family.  Outcome: Progressing   

## 2019-12-15 NOTE — Progress Notes (Signed)
Speech Language Pathology Weekly Progress and Session Note  Patient Details  Name: Olivia Mann MRN: [id number] Date of Birth: [birth year]  Beginning of progress report period: December 08, 2019 End of progress report period: December 15, 2019  Today's Date: 12/15/2019 SLP Individual Time: 1115-1200 SLP Individual Time Calculation (min): 45 min  Short Term Goals: Week 2: SLP Short Term Goal 1 (Week 2): Pt will consume therapeutic trials of dys 2 textures with min cues for use of swallowing precautions and minimal overt s/s of aspiration over 3 consecutive sessions prior to advancement. SLP Short Term Goal 1 - Progress (Week 2): Met SLP Short Term Goal 2 (Week 2): Pt will locate items at midline during functional tasks with Max assist verbal and visual cues in 25% of opportunities. SLP Short Term Goal 2 - Progress (Week 2): Met SLP Short Term Goal 3 (Week 2): Pt will sustain her attention to basic, familiar tasks for 3 minutes with mod verbal cues for redirection. SLP Short Term Goal 3 - Progress (Week 2): Met SLP Short Term Goal 4 (Week 2): Pt will complete basic, familiar tasks wtih mod assist for functional problem solving SLP Short Term Goal 4 - Progress (Week 2): Met    New Short Term Goals: Week 3: SLP Short Term Goal 1 (Week 3): Pt will sustain her attention to basic, familiar tasks for 30 minutes with mod verbal cues for redirection. SLP Short Term Goal 2 (Week 3): Pt will complete basic, familiar tasks wtih min assist for functional problem solving SLP Short Term Goal 3 (Week 3): Pt will locate items at midline during functional tasks with Mod assist verbal and visual cues in 75% of opportunities. SLP Short Term Goal 4 (Week 3): Pt will consume current diet with minimal overt s/s of aspiration with Min A verbal cues for use of swallowing compensatory strategies. SLP Short Term Goal 5 (Week 3): Pt will consume therapeutic trials of dys 3 textures with min cues for use of swallowing  precautions and minimal overt s/s of aspiration over 3 consecutive sessions prior to advancement.  Weekly Progress Updates: Patient has made excellent gains and has met 4 of 4 STGs this reporting period. Currently, patient is consuming Dys. 2 textures with thin liquids with minimal overt s/s of aspiration and overall Min verbal cues for use of swallowing compensatory strategies. Patient also demonstrates improved cognitive functioning and requires overall Mod A multimodal cues for functional problem solving, sustained attention, and visual scanning towards left field of environment. Patient also demonstrates improved intellectual awareness of deficits but continues to require overall Max A verbal cues for emergent awareness. Patient and family education ongoing. Patient would benefit from continued skilled SLP intervention to maximize her swallowing and cognitive functioning prior to discharge.     Intensity: Minumum of 1-2 x/day, 30 to 90 minutes Frequency: 3 to 5 out of 7 days Duration/Length of Stay: 12/27/19 Treatment/Interventions: Cognitive remediation/compensation;Cueing hierarchy;Functional tasks;Patient/family education;Internal/external aids;Dysphagia/aspiration precaution training;Environmental controls;Therapeutic Activities   Daily Session  Skilled Therapeutic Interventions: Skilled treatment session focused on dysphagia and cognitive goals. SLP facilitated session by providing a trial tray of Dys. 2 textures with thin liquids. Patient demonstrated mildly prolonged mastication but complete oral clearance without overt s/s of aspiration, therefore, recommend patient upgrade to Dys. 2 textures and continue full supervision. Throughout meal, patient able to alternate her attention between self-feeding and conversation with overall Min A verbal cues. Patient perseverative on wanting to leave the hospital to pay a bill requiring total A for  emergent awareness. Patient left upright in wheelchair  with alarm on and daughter present. Continue with current plan of care.      Pain No/Denies Pain   Therapy/Group: Individual Therapy  Kelin Nixon 12/15/2019, 6:35 AM

## 2019-12-15 NOTE — Progress Notes (Signed)
[facility] PHYSICAL MEDICINE & REHABILITATION PROGRESS NOTE  Subjective/Complaints: Up in w/c working on breakfast. No new complaints. Anxious to get home.   ROS: Patient denies fever, rash, sore throat, blurred vision, nausea, vomiting, diarrhea, cough, shortness of breath or chest pain, joint or back pain, headache, or mood change.   Objective: Vital Signs: Blood pressure 127/90, pulse 83, temperature 99 F (37.2 C), temperature source Oral, resp. rate 16, height 5\' 1"  (1.549 m), weight 55.3 kg, SpO2 100 %. No results found. No results for input(s): WBC, HGB, HCT, PLT in the last 72 hours. No results for input(s): NA, K, CL, CO2, GLUCOSE, BUN, CREATININE, CALCIUM in the last 72 hours.  Physical Exam: BP 127/90 (BP Location: Right Arm)   Pulse 83   Temp 99 F (37.2 C) (Oral)   Resp 16   Ht 5\' 1"  (1.549 m)   Wt 55.3 kg   SpO2 100%   BMI 23.05 kg/m  Constitutional: No distress . Vital signs reviewed. HEENT: EOMI, oral membranes moist Neck: supple Cardiovascular: RRR without murmur. No JVD    Respiratory: CTA Bilaterally without wheezes or rales. Normal effort    GI: BS +, non-tender, non-distended  Skin: scalp incision CDI Psych: more alert. Remains distracted.  Musc: No edema in extremities.  No tenderness in extremities. Neurological: oriented to person and place today. Remembers my name, told me about her family.  left inattention. Decreased pain sense left arm and leg. LUE 0/5. LLE HF and KE 1+ to 2/5. 0/5 ADF/PF.  Assessment/Plan: 1. Functional deficits secondary to subarachnoid hemorrhage/hematoma status post right frontotemporal craniotomy which require 3+ hours per day of interdisciplinary therapy in a comprehensive inpatient rehab setting.  Physiatrist is providing close team supervision and 24 hour management of active medical problems listed below.  Physiatrist and rehab team continue to assess barriers to discharge/monitor patient progress toward functional and  medical goals  Care Tool:  Bathing    Body parts bathed by patient: Chest, Abdomen, Front perineal area, Right upper leg, Left upper leg, Face, Left arm   Body parts bathed by helper: Right arm, Right lower leg, Left lower leg, Buttocks     Bathing assist Assist Level: Moderate Assistance - Patient 50 - 74%     Upper Body Dressing/Undressing Upper body dressing   What is the patient wearing?: Pull over shirt    Upper body assist Assist Level: Moderate Assistance - Patient 50 - 74%    Lower Body Dressing/Undressing Lower body dressing      What is the patient wearing?: Incontinence brief, Pants     Lower body assist Assist for lower body dressing: Moderate Assistance - Patient 50 - 74%     Toileting Toileting    Toileting assist Assist for toileting: Maximal Assistance - Patient 25 - 49%     Transfers Chair/bed transfer  Transfers assist     Chair/bed transfer assist level: Moderate Assistance - Patient 50 - 74%     Locomotion Ambulation   Ambulation assist      Assist level: Maximal Assistance - Patient 25 - 49% Assistive device: Walker-rolling Max distance: 20'   Walk 10 feet activity   Assist     Assist level: Maximal Assistance - Patient 25 - 49% Assistive device: Walker-rolling   Walk 50 feet activity   Assist Walk 50 feet with 2 turns activity did not occur: Safety/medical concerns         Walk 150 feet activity   Assist Walk 150 feet  activity did not occur: Safety/medical concerns         Walk 10 feet on uneven surface  activity   Assist Walk 10 feet on uneven surfaces activity did not occur: Safety/medical concerns         Wheelchair     Assist Will patient use wheelchair at discharge?: (TBD) Type of Wheelchair: Manual    Wheelchair assist level: Minimal Assistance - Patient > 75% Max wheelchair distance: 150'    Wheelchair 50 feet with 2 turns activity    Assist        Assist Level: Minimal  Assistance - Patient > 75%   Wheelchair 150 feet activity     Assist     Assist Level: Minimal Assistance - Patient > 75%      1.  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  Antithrombotics: -DVT/anticoagulation: Subcutaneous Lovenox initiated 11/22/2019.  Monitor for any bleeding episodes               -antiplatelet therapy: N/A 3. Pain Management: Oxycodone as needed 4. Mood: Xanax 1 mg every 6 hours as needed---limit as possible             -antipsychotic agents: N/A 5. Neuropsych: This patient is not capable of making decisions on her own behalf.  -1/29 continue ritalin trial for attention and focus, attention improving 6. Skin/Wound Care: Routine skin checks 7. Fluids/Electrolytes/Nutrition: Routine in and outs.    -NG feeds decreased at night to help with appetite   -protein for low albumin  -changed IVF to HS only to help with therapies  -continue NGT for now as she just isn't eating consistently enough  1/23 -added megace    1/24- eating little  1/25: states that she doesn't eat much at baseline, daughter corroborates. Would like NGT removed; will discuss with SLP.  1/29 po intake improved. Pt anxious to upgrade diet! 8. Seizure prophylaxis.   Continue Keppra 1000 mg twice daily 9.  Post stroke dysphagia:    -continue Dysphagia #1 thins liquids.   -advance per speech therapy  -on PO alone currently 10. Hypertension.  Completed course of Nimotop.                fair to good control 1/24-29, keep an eye on DBP 11. Hypokalemia  Potassium 3.9 1/26  -recheck Monday 12. Increased LFT's. Likely reactive  -1/26 LFT's continue to decrease.           LOS: 13 days A FACE TO FACE EVALUATION WAS  PERFORMED  Ranelle Oyster 12/15/2019, 11:18 AM

## 2019-12-16 ENCOUNTER — Inpatient Hospital Stay (HOSPITAL_COMMUNITY): Payer: Self-pay | Admitting: Speech Pathology

## 2019-12-16 LAB — GLUCOSE, CAPILLARY
Glucose-Capillary: 98 mg/dL (ref 70–99)
Glucose-Capillary: 123 mg/dL — ABNORMAL HIGH (ref 70–99)
Glucose-Capillary: 117 mg/dL — ABNORMAL HIGH (ref 70–99)
Glucose-Capillary: 127 mg/dL — ABNORMAL HIGH (ref 70–99)

## 2019-12-16 MED ORDER — QUETIAPINE FUMARATE 25 MG PO TABS: 25.0000 mg | ORAL_TABLET | Freq: Every day | ORAL | Status: DC | PRN

## 2019-12-16 NOTE — Progress Notes (Signed)
Patient has been agitated since roughly noon today. Has been stating "she does not want to stay here, it's cold and I saw a rat run through my room last night." This RN and the NT continually reorient her and educate her on why we cannot let her leave. PRN Xanax was give at 1155 but did not decrease agitation or anxiety. Patient continues to request to leave stating that she "has everything set up that needs to be including a place to go with a certified therapist," spoke to Inetta Fermo (daughter) and then Ronnald Nian (son) on the phone regarding situation and both have attempted to call and calm her down. Patient is in room "making plans to leave" but is refusing her blood sugar check at this time.

## 2019-12-16 NOTE — Progress Notes (Signed)
[place] PHYSICAL MEDICINE & REHABILITATION PROGRESS NOTE  Subjective/Complaints: Wants to leave. Says there was a rat in her room last night and she is upset that she has never seen housecleaning in her room. She is upset that there are crumbs on her floor. She is upset that there is cold water in the shower and her room is cold. She is tired of therapy. She wants to go home and misses her family. She says she will come back Monday.    ROS: Patient denies fever, rash, sore throat, blurred vision, nausea, vomiting, diarrhea, cough, shortness of breath or chest pain, joint or back pain, headache, or mood change.   Objective: Vital Signs: Blood pressure (!) 120/92, pulse (!) 107, temperature 98.7 F (37.1 C), resp. rate 18, height 5\' 1"  (1.549 m), weight 54.9 kg, SpO2 97 %. No results found. No results for input(s): WBC, HGB, HCT, PLT in the last 72 hours. No results for input(s): NA, K, CL, CO2, GLUCOSE, BUN, CREATININE, CALCIUM in the last 72 hours.  Physical Exam: BP (!) 120/92 (BP Location: Right Arm)   Pulse (!) 107   Temp 98.7 F (37.1 C)   Resp 18   Ht 5\' 1"  (1.549 m)   Wt 54.9 kg   SpO2 97%   BMI 22.86 kg/m  Constitutional: No distress . Vital signs reviewed. Trying to get out of bed.  HEENT: EOMI, oral membranes moist Neck: supple Cardiovascular: RRR without murmur. No JVD    Respiratory: CTA Bilaterally without wheezes or rales. Normal effort    GI: BS +, non-tender, non-distended  Skin: scalp incision CDI Psych: more alert. Remains distracted.  Musc: No edema in extremities.  No tenderness in extremities. Neurological: oriented to person and place today. Remembers my name, told me about her family.  left inattention. Decreased pain sense left arm and leg. LUE 0/5. LLE HF and KE 1+ to 2/5. 0/5 ADF/PF.   Assessment/Plan: 1. Functional deficits secondary to subarachnoid hemorrhage/hematoma status post right frontotemporal craniotomy which require 3+ hours per day of  interdisciplinary therapy in a comprehensive inpatient rehab setting.  Physiatrist is providing close team supervision and 24 hour management of active medical problems listed below.  Physiatrist and rehab team continue to assess barriers to discharge/monitor patient progress toward functional and medical goals  Care Tool:  Bathing    Body parts bathed by patient: Chest, Abdomen, Front perineal area, Right upper leg, Left upper leg, Face, Left arm   Body parts bathed by helper: Buttocks     Bathing assist Assist Level: Moderate Assistance - Patient 50 - 74%     Upper Body Dressing/Undressing Upper body dressing   What is the patient wearing?: Pull over shirt    Upper body assist Assist Level: Moderate Assistance - Patient 50 - 74%    Lower Body Dressing/Undressing Lower body dressing      What is the patient wearing?: Incontinence brief, Pants     Lower body assist Assist for lower body dressing: Moderate Assistance - Patient 50 - 74%     Toileting Toileting    Toileting assist Assist for toileting: Maximal Assistance - Patient 25 - 49%     Transfers Chair/bed transfer  Transfers assist     Chair/bed transfer assist level: Moderate Assistance - Patient 50 - 74%     Locomotion Ambulation   Ambulation assist      Assist level: Maximal Assistance - Patient 25 - 49% Assistive device: Walker-rolling Max distance: 15'   Walk  10 feet activity   Assist     Assist level: Maximal Assistance - Patient 25 - 49% Assistive device: Walker-rolling   Walk 50 feet activity   Assist Walk 50 feet with 2 turns activity did not occur: Safety/medical concerns         Walk 150 feet activity   Assist Walk 150 feet activity did not occur: Safety/medical concerns         Walk 10 feet on uneven surface  activity   Assist Walk 10 feet on uneven surfaces activity did not occur: Safety/medical concerns         Wheelchair     Assist Will patient  use wheelchair at discharge?: (TBD) Type of Wheelchair: Manual    Wheelchair assist level: Minimal Assistance - Patient > 75% Max wheelchair distance: 150'    Wheelchair 50 feet with 2 turns activity    Assist        Assist Level: Minimal Assistance - Patient > 75%   Wheelchair 150 feet activity     Assist     Assist Level: Minimal Assistance - Patient > 75%      1.  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  Antithrombotics: -DVT/anticoagulation: Subcutaneous Lovenox initiated 11/22/2019.  Monitor for any bleeding episodes               -antiplatelet therapy: N/A 3. Pain Management: Oxycodone as needed 4. Mood: Xanax 1 mg every 6 hours as needed---limit as possible             -antipsychotic agents: N/A 5. Neuropsych: This patient is not capable of making decisions on her own behalf.  -1/29 continue ritalin trial for attention and focus, attention improving  1/30: would benefit from neuropsych eval. Patient seeing rat in room, possible hallucination. Poor retention of fact that she cannot go home and return to rehab the next day despite multiple discussions.  6. Skin/Wound Care: Routine skin checks 7. Fluids/Electrolytes/Nutrition: Routine in and outs.    -NG feeds decreased at night to help with appetite   -protein for low albumin  -changed IVF to HS only to help with therapies  -continue NGT for now as she just isn't eating consistently enough  1/23 -added megace    1/24- eating little  1/25: states that she doesn't eat much at baseline, daughter corroborates. Would like NGT removed; will discuss with SLP.  1/29 po intake improved. Pt anxious to upgrade diet! 8. Seizure prophylaxis.   Continue  Keppra 1000 mg twice daily 9.  Post stroke dysphagia:    -continue Dysphagia #1 thins liquids.   -advance per speech therapy  -on PO alone currently 10. Hypertension.  Completed course of Nimotop.                fair to good control 1/24-29, keep an eye on DBP  1/30: well controlled.  11. Hypokalemia  Potassium 3.9 1/26  -recheck Monday 12. Increased LFT's. Likely reactive  -1/26 LFT's continue to decrease.    13. Agitation: Her desire to leave all day today was a strain on RN who had to constantly reassure her. She received prn 1mg  Xanax with no effect. Added Seroquel 25mg  QD prn for agitation. Can increase dose if needed.  LOS: 14 days A FACE TO FACE EVALUATION WAS PERFORMED  Sayre Witherington P Alora Gorey 12/16/2019, 6:18 PM

## 2019-12-16 NOTE — Plan of Care (Signed)
  Problem: Consults Goal: RH GENERAL PATIENT EDUCATION Description: See Patient Education module for education specifics. Outcome: Progressing Goal: Skin Care Protocol Initiated - if Braden Score 18 or less Description: If consults are not indicated, leave blank or document N/A Outcome: Progressing Goal: Nutrition Consult-if indicated Outcome: Progressing   Problem: RH BOWEL ELIMINATION Goal: RH STG MANAGE BOWEL WITH ASSISTANCE Description: STG Manage Bowel with min Assistance. Outcome: Progressing   Problem: RH BLADDER ELIMINATION Goal: RH STG MANAGE BLADDER WITH ASSISTANCE Description: STG Manage Bladder With min Assistance Outcome: Progressing   Problem: RH SAFETY Goal: RH STG ADHERE TO SAFETY PRECAUTIONS W/ASSISTANCE/DEVICE Description: STG Adhere to Safety Precautions With cues and reminders Assistance/Device. Outcome: Progressing Goal: RH STG DECREASED RISK OF FALL WITH ASSISTANCE Description: STG Decreased Risk of Fall With cues and reminders Assistance. Outcome: Progressing   Problem: RH PAIN MANAGEMENT Goal: RH STG PAIN MANAGED AT OR BELOW PT'S PAIN GOAL Description: Pain level less that 4 on scale of 0-10 Outcome: Progressing   Problem: RH KNOWLEDGE DEFICIT GENERAL Goal: RH STG INCREASE KNOWLEDGE OF SELF CARE AFTER HOSPITALIZATION Description: Pt will be able to adhere to medication regimen, dietary restrictions and lifestyle modification to prevent complications with mod I assist from family.  Outcome: Progressing   

## 2019-12-16 NOTE — Progress Notes (Signed)
Speech Language Pathology Daily Session Note  Patient Details  Name: Olivia Mann MRN: [id number] Date of Birth: 1956-08-29  Today's Date: 12/16/2019 SLP Individual Time: 1003-1033 SLP Individual Time Calculation (min): 30 min  Short Term Goals: Week 3: SLP Short Term Goal 1 (Week 3): Pt will sustain her attention to basic, familiar tasks for 30 minutes with mod verbal cues for redirection. SLP Short Term Goal 2 (Week 3): Pt will complete basic, familiar tasks wtih min assist for functional problem solving SLP Short Term Goal 3 (Week 3): Pt will locate items at midline during functional tasks with Mod assist verbal and visual cues in 75% of opportunities. SLP Short Term Goal 4 (Week 3): Pt will consume current diet with minimal overt s/s of aspiration with Min A verbal cues for use of swallowing compensatory strategies. SLP Short Term Goal 5 (Week 3): Pt will consume therapeutic trials of dys 3 textures with min cues for use of swallowing precautions and minimal overt s/s of aspiration over 3 consecutive sessions prior to advancement.  Skilled Therapeutic Interventions: Patient received skilled SLP services targeting cognitive goals. Upon entry to room patient appeared frustrated with her cell phone stating "I don't know how to use this anymore". Therefore, facilitated functional problem solving tasks for using the patient's cell phone. Patient required mod verbal cues to sequence the steps to locating pictures on her phone and how to call family members on the cell phone. Mod verbal cues were needed to redirect to completing one task at a time on the cell phone. At the end of therapy session patient upright in chair, chair alarm activated, and all needs within reach.  Pain Pain Assessment Pain Scale: 0-10 Pain Score: 0-No pain  Therapy/Group: Individual Therapy  Arnette Schaumann 12/16/2019, 10:35 AM

## 2019-12-17 ENCOUNTER — Inpatient Hospital Stay (HOSPITAL_COMMUNITY): Payer: Self-pay

## 2019-12-17 LAB — GLUCOSE, CAPILLARY
Glucose-Capillary: 95 mg/dL (ref 70–99)
Glucose-Capillary: 103 mg/dL — ABNORMAL HIGH (ref 70–99)
Glucose-Capillary: 98 mg/dL (ref 70–99)
Glucose-Capillary: 109 mg/dL — ABNORMAL HIGH (ref 70–99)

## 2019-12-17 MED ORDER — BISACODYL 10 MG RE SUPP
10.0000 mg | Freq: Once | RECTAL | Status: AC
  Administered 2019-12-17: 10 mg via RECTAL
  Filled 2019-12-17: qty 1

## 2019-12-17 NOTE — Progress Notes (Addendum)
[place] PHYSICAL MEDICINE & REHABILITATION PROGRESS NOTE  Subjective/Complaints: Calmer today.  Has mild tremor.  Having regular BM Denies pain.   ROS: Patient denies fever, rash, sore throat, blurred vision, nausea, vomiting, diarrhea, cough, shortness of breath or chest pain, joint or back pain, headache, or mood change.   Objective: Vital Signs: Blood pressure (!) 140/92, pulse 97, temperature 98.1 F (36.7 C), resp. rate 18, height 5\' 1"  (1.549 m), weight 54.9 kg, SpO2 100 %. No results found. No results for input(s): WBC, HGB, HCT, PLT in the last 72 hours. No results for input(s): NA, K, CL, CO2, GLUCOSE, BUN, CREATININE, CALCIUM in the last 72 hours.  Physical Exam: BP (!) 140/92 (BP Location: Right Arm)   Pulse 97   Temp 98.1 F (36.7 C)   Resp 18   Ht 5\' 1"  (1.549 m)   Wt 54.9 kg   SpO2 100%   BMI 22.86 kg/m  Constitutional: No distress . Vital signs reviewed. Sitting in chair.  HEENT: EOMI, oral membranes moist Neck: supple Cardiovascular: RRR without murmur. No JVD    Respiratory: CTA Bilaterally without wheezes or rales. Normal effort    GI: BS +, non-tender, non-distended  Skin: scalp incision CDI Psych: more alert. Remains distracted.  Musc: No edema in extremities.  No tenderness in extremities. Neurological: oriented to person and place today. Remembers my name, told me about her family.  left inattention. Decreased pain sense left arm and leg. LUE 0/5. LLE HF and KE 1+ to 2/5. 0/5 ADF/PF. Mild tremor.  Assessment/Plan: 1. Functional deficits secondary to subarachnoid hemorrhage/hematoma status post right frontotemporal craniotomy which require 3+ hours per day of interdisciplinary therapy in a comprehensive inpatient rehab setting.  Physiatrist is providing close team supervision and 24 hour management of active medical problems listed below.  Physiatrist and rehab team continue to assess barriers to discharge/monitor patient progress toward  functional and medical goals  Care Tool:  Bathing    Body parts bathed by patient: Chest, Abdomen, Front perineal area, Right upper leg, Left upper leg, Face, Left arm, Left lower leg, Right lower leg   Body parts bathed by helper: Buttocks, Right arm     Bathing assist Assist Level: Moderate Assistance - Patient 50 - 74%     Upper Body Dressing/Undressing Upper body dressing   What is the patient wearing?: Pull over shirt    Upper body assist Assist Level: Minimal Assistance - Patient > 75%    Lower Body Dressing/Undressing Lower body dressing      What is the patient wearing?: Incontinence brief, Pants     Lower body assist Assist for lower body dressing: Moderate Assistance - Patient 50 - 74%     Toileting Toileting    Toileting assist Assist for toileting: Maximal Assistance - Patient 25 - 49%     Transfers Chair/bed transfer  Transfers assist     Chair/bed transfer assist level: Moderate Assistance - Patient 50 - 74%     Locomotion Ambulation   Ambulation assist      Assist level: Maximal Assistance - Patient 25 - 49% Assistive device: Walker-rolling Max distance: 15'   Walk 10 feet activity   Assist     Assist level: Maximal Assistance - Patient 25 - 49% Assistive device: Walker-rolling   Walk 50 feet activity   Assist Walk 50 feet with 2 turns activity did not occur: Safety/medical concerns         Walk 150 feet activity   Assist Walk  150 feet activity did not occur: Safety/medical concerns         Walk 10 feet on uneven surface  activity   Assist Walk 10 feet on uneven surfaces activity did not occur: Safety/medical concerns         Wheelchair     Assist Will patient use wheelchair at discharge?: (TBD) Type of Wheelchair: Manual    Wheelchair assist level: Minimal Assistance - Patient > 75% Max wheelchair distance: 150'    Wheelchair 50 feet with 2 turns activity    Assist        Assist Level:  Minimal Assistance - Patient > 75%   Wheelchair 150 feet activity     Assist     Assist Level: Minimal Assistance - Patient > 75%      1.  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  Antithrombotics: -DVT/anticoagulation: Subcutaneous Lovenox initiated 11/22/2019.  Monitor for any bleeding episodes               -antiplatelet therapy: N/A 3. Pain Management: Oxycodone as needed  1/31: pain well controlled  4. Mood: Xanax 1 mg every 6 hours as needed---limit as possible             -antipsychotic agents: N/A 5. Neuropsych: This patient is not capable of making decisions on her own behalf.  -1/29 continue ritalin trial for attention and focus, attention improving  1/30: may benefit from neuropsych eval. Patient seeing rat in room, possible hallucination. Poor retention of fact that she cannot go home and return to rehab the next day despite multiple discussions.  6. Skin/Wound Care: Routine skin checks 7. Fluids/Electrolytes/Nutrition: Routine in and outs.    -NG feeds decreased at night to help with appetite   -protein for low albumin  -changed IVF to HS only to help with therapies  -continue NGT for now as she just isn't eating consistently enough  1/23 -added megace    1/24- eating little  1/25: states that she doesn't eat much at baseline, daughter corroborates. Would like NGT removed; will discuss with SLP.  1/29 po intake improved. Pt anxious to upgrade diet! 8. Seizure prophylaxis.   Continue Keppra 1000 mg twice daily 9.  Post stroke dysphagia:    -continue Dysphagia #1 thins liquids.   -advance per speech therapy  -on PO alone currently 10. Hypertension.  Completed course of Nimotop.                 fair to good control 1/24-29, keep an eye on DBP  1/30, 1/31 well controlled.  11. Hypokalemia  Potassium 3.9 1/26  -recheck Monday 12. Increased LFT's. Likely reactive  -1/26 LFT's continue to decrease.    13. Agitation: Her desire to leave all day today was a strain on RN who had to constantly reassure her. She received prn 1mg  Xanax with no effect. Added Seroquel 25mg  QD prn for agitation. Can increase dose if needed.   1/31: calmer today.  14. Constipation: 1/31: requests suppository; ordered.         LOS: 15 days A FACE TO FACE EVALUATION WAS PERFORMED  Clide Deutscher Danitza Schoenfeldt 12/17/2019, 4:03 PM

## 2019-12-17 NOTE — Progress Notes (Addendum)
Occupational Therapy Session Note  Patient Details  Name: Olivia Mann MRN: [id number] Date of Birth: 1955-12-27  Today's Date: 12/17/2019 OT Individual Time: 6387-5643 OT Individual Time Calculation (min): 58 min    Short Term Goals: Week 2:  OT Short Term Goal 1 (Week 2): Pt will utilize compensatory methods to locate 2 ADL items at midline with mod cueing OT Short Term Goal 2 (Week 2): Pt will don shirt with min A OT Short Term Goal 3 (Week 2): Pt will complete 2/3 toileting tasks with mod A Week 3:     Skilled Therapeutic Interventions/Progress Updates:    Estim applied prior to session for 60 min with pt tolerating well and skin in tact  Saebo Stim One 330 pulse width 35 Hz pulse rate On 8 sec/ off 8 sec Ramp up/ down 2 sec Symmetrical Biphasic wave form  Max intensity at 500 Ohm load    pt received in bed agreeable to bathing at shower level however reporting need to toilet. Pt completes min-mod A stand pivot transfer with facilitation of L pivot steps EOB>w/c<>toilet/TTB with grab bar and directional cues to place hands. Pt very impulsive with sit to stands this date multiple times attempting to stand for hygiene/bathing with no warning. When asked if pt can hold self up in standing without help, pt states, "I can do it, I have this bar." educated pt that there isnt a bar at home and she has to let go of bar to wash buttocks, but no evidence of awareness/learning. Pt unable to void and requires increased time to attempt seated on toilet at beginning and end of session. Pt bathes sit to stand with MOD A and instructional cues for adaptive techniques to wash R armpit and arm. Pt able ot wash B feet this date by crossing LEs. Pt dresses with MIN A for UB for threading LUE and MOD A for donning underwear/pants at sit to stand. Pt able to don B socks in figure 4 and requires A to don B shoes. Exited session with RN present supervising pt attempting to toilet again.  Therapy  Documentation Precautions:  Precautions Precautions: Fall Precaution Comments: L neglect, L hemiparesis Restrictions Weight Bearing Restrictions: No General:   Vital Signs: Therapy Vitals Temp: 98.1 F (36.7 C) Pulse Rate: 97 Resp: 18 BP: (!) 140/92 Patient Position (if appropriate): Lying Oxygen Therapy SpO2: 100 % O2 Device: Room Air Pain:   ADL: ADL Eating: Not assessed Grooming: Maximal assistance Where Assessed-Grooming: Edge of bed Upper Body Bathing: Moderate assistance Where Assessed-Upper Body Bathing: Edge of bed Lower Body Bathing: Maximal assistance Where Assessed-Lower Body Bathing: Edge of bed Upper Body Dressing: Maximal assistance Where Assessed-Upper Body Dressing: Edge of bed Lower Body Dressing: Maximal assistance Where Assessed-Lower Body Dressing: Edge of bed Toileting: Not assessed Toilet Transfer: Maximal assistance Toilet Transfer Method: Surveyor, minerals: Gaffer: Not assessed Vision   Perception    Praxis   Exercises:   Other Treatments:     Therapy/Group: Individual Therapy  [name] 12/17/2019, 4:00 PM

## 2019-12-17 NOTE — Plan of Care (Signed)
  Problem: Consults Goal: RH GENERAL PATIENT EDUCATION Description: See Patient Education module for education specifics. Outcome: Progressing Goal: Skin Care Protocol Initiated - if Braden Score 18 or less Description: If consults are not indicated, leave blank or document N/A Outcome: Progressing Goal: Nutrition Consult-if indicated Outcome: Progressing   Problem: RH BOWEL ELIMINATION Goal: RH STG MANAGE BOWEL WITH ASSISTANCE Description: STG Manage Bowel with min Assistance. Outcome: Progressing   Problem: RH BLADDER ELIMINATION Goal: RH STG MANAGE BLADDER WITH ASSISTANCE Description: STG Manage Bladder With min Assistance Outcome: Progressing   Problem: RH SAFETY Goal: RH STG ADHERE TO SAFETY PRECAUTIONS W/ASSISTANCE/DEVICE Description: STG Adhere to Safety Precautions With cues and reminders Assistance/Device. Outcome: Progressing Goal: RH STG DECREASED RISK OF FALL WITH ASSISTANCE Description: STG Decreased Risk of Fall With cues and reminders Assistance. Outcome: Progressing   Problem: RH PAIN MANAGEMENT Goal: RH STG PAIN MANAGED AT OR BELOW PT'S PAIN GOAL Description: Pain level less that 4 on scale of 0-10 Outcome: Progressing   Problem: RH KNOWLEDGE DEFICIT GENERAL Goal: RH STG INCREASE KNOWLEDGE OF SELF CARE AFTER HOSPITALIZATION Description: Pt will be able to adhere to medication regimen, dietary restrictions and lifestyle modification to prevent complications with mod I assist from family.  Outcome: Progressing   

## 2019-12-18 ENCOUNTER — Inpatient Hospital Stay (HOSPITAL_COMMUNITY): Payer: Self-pay

## 2019-12-18 ENCOUNTER — Inpatient Hospital Stay (HOSPITAL_COMMUNITY): Payer: Self-pay | Admitting: Speech Pathology

## 2019-12-18 ENCOUNTER — Inpatient Hospital Stay (HOSPITAL_COMMUNITY): Payer: Self-pay | Admitting: Physical Therapy

## 2019-12-18 LAB — COMPREHENSIVE METABOLIC PANEL
ALT: 42 U/L (ref 0–44)
AST: 24 U/L (ref 15–41)
Albumin: 3.4 g/dL — ABNORMAL LOW (ref 3.5–5.0)
Alkaline Phosphatase: 83 U/L (ref 38–126)
Anion gap: 14 (ref 5–15)
BUN: 15 mg/dL (ref 8–23)
CO2: 23 mmol/L (ref 22–32)
Calcium: 10.2 mg/dL (ref 8.9–10.3)
Chloride: 102 mmol/L (ref 98–111)
Creatinine, Ser: 0.62 mg/dL (ref 0.44–1.00)
GFR calc Af Amer: >60 mL/min (ref >60)
GFR calc non Af Amer: >60 mL/min (ref >60)
Glucose, Bld: 99 mg/dL (ref 70–99)
Potassium: 4.1 mmol/L (ref 3.5–5.1)
Sodium: 139 mmol/L (ref 135–145)
Total Bilirubin: 0.3 mg/dL (ref 0.3–1.2)
Total Protein: 6.8 g/dL (ref 6.5–8.1)

## 2019-12-18 LAB — CBC
HCT: 36.6 % (ref 36.0–46.0)
Hemoglobin: 12.2 g/dL (ref 12.0–15.0)
MCH: 33.2 pg (ref 26.0–34.0)
MCHC: 33.3 g/dL (ref 30.0–36.0)
MCV: 99.5 fL (ref 80.0–100.0)
Platelets: 349 10*3/uL (ref 150–400)
RBC: 3.68 MIL/uL — ABNORMAL LOW (ref 3.87–5.11)
RDW: 13.2 % (ref 11.5–15.5)
WBC: 5.9 10*3/uL (ref 4.0–10.5)
nRBC: 0 % (ref 0.0–0.2)

## 2019-12-18 LAB — GLUCOSE, CAPILLARY
Glucose-Capillary: 89 mg/dL (ref 70–99)
Glucose-Capillary: 91 mg/dL (ref 70–99)
Glucose-Capillary: 90 mg/dL (ref 70–99)
Glucose-Capillary: 107 mg/dL — ABNORMAL HIGH (ref 70–99)

## 2019-12-18 NOTE — Progress Notes (Signed)
Occupational Therapy Session Note  Patient Details  Name: Olivia Mann MRN: [id number] Date of Birth: 12/23/1955  Today's Date: 12/18/2019 OT Individual Time: 1300-1415 OT Individual Time Calculation (min): 75 min    Short Term Goals: Week 2:  OT Short Term Goal 1 (Week 2): Pt will utilize compensatory methods to locate 2 ADL items at midline with mod cueing OT Short Term Goal 2 (Week 2): Pt will don shirt with min A OT Short Term Goal 3 (Week 2): Pt will complete 2/3 toileting tasks with mod A  Skilled Therapeutic Interventions/Progress Updates:    Pt received sitting up in the w/c with no c/o pain. Pt required cueing and set up assist to continue eating lunch. Pt able to manage swallow strategies without further cueing. Pt perseverative on d/c date again. Pt given increased time to finish eating and then encouraged to take shower. Pt completed visual scanning task, looking for clothes in drawer with min cueing for head turn to compensate. Pt brought into bathroom for shower transfer but pt perseverative on calling daughter to discuss "incorrect d/c date". Pt assisted in calling daughter and daughter provided emotional support. Pt completed stand pivot transfer into the shower with mod A. Pt able to wash UB with min A, LB with mod A. Pt requiring cueing for L attention/scanning throughout. Pt donned shirt min A, and pants with mod A. Pt completed oral care at the sink with set up assist. Pt was left sitting up in the w/c with all needs met, chair alarm set.   Unattended e-stim:  Rowe Robert was applied to pt's L anterior and posterior deltoid to reduce shoulder sublux and increase muscle tone. Pt with no adverse skin reaction. No pain before, during, or after session.  Saebo Stim One 330 pulse width 35 Hz pulse rate On 8 sec/ off 8 sec Ramp up/ down 2 sec Symmetrical Biphasic wave form  Max intensity 139m at 500 Ohm load   Therapy Documentation Precautions:  Precautions Precautions:  Fall Precaution Comments: L neglect, L hemiparesis Restrictions Weight Bearing Restrictions: No   Therapy/Group: Individual Therapy  SCurtis Sites2/11/2019, 12:18 PM

## 2019-12-18 NOTE — Progress Notes (Signed)
Physical Therapy Session Note  Patient Details  Name: Olivia Mann MRN: [id number] Date of Birth: September 21, 1956  Today's Date: 12/18/2019 PT Individual Time: 6644-0347 PT Individual Time Calculation (min): 54 min   Short Term Goals: Week 3:  PT Short Term Goal 1 (Week 3): Pt will initiate stair training PT Short Term Goal 2 (Week 3): Pt will ambulate 85' w/ mod assist w/ LRAD PT Short Term Goal 3 (Week 3): Pt will maintain dynamic standing balance w/ mod assist consistently  Skilled Therapeutic Interventions/Progress Updates:   Pt received in w/c and agreeable to therapy, no c/o pain. Pt alert and oriented to environment and situation this morning, improved from previous morning sessions w/ this therapist. Requesting to toilet. Stand pivot to toilet w/ min assist and mod assist for LE garment management, pt continent of void. Self-care tasks at sink w/ set-up assist including washing hands, brushing teeth, and washing face. Ate breakfast w/ supervision, worked on scanning to find items on L side of tray. Pt able to attend to items at midline well this morning, also stated "I'm going to scan for my grits until I find them" w/o prompting from therapist. Able to scan to L environment w/ only occasional verbal cues. Pt self-propelled w/c to/from therapy gym w/ supervision-min assist. Pt continues to need verbal and visual cues to attend to L environment and for L obstacle avoidance, however improved since last session. Worked on postural control and lateral weight shifting in stance while tossing horseshoes. Min-mod assist for dynamic standing balance w/ emphasis on R lateral weight shifting and then maintaining upright balance w/ equal weight distribution (vs L lateral lean or LOB). Performed 20+ reps w/ intermittent seated rest breaks. Tactile and manual cues for L quad and glut activation to maintain extension in L weight acceptance and verbal cues for equal weight distribution. Returned to room and ended  session in supine, all needs in reach. Pt declined option of sitting in w/c at nurses station.   Therapy Documentation Precautions:  Precautions Precautions: Fall Precaution Comments: L neglect, L hemiparesis Restrictions Weight Bearing Restrictions: No Vital Signs: Therapy Vitals Temp: 98.7 F (37.1 C) Pulse Rate: 85 Resp: 18 BP: 121/88 Patient Position (if appropriate): Lying Oxygen Therapy SpO2: 100 % O2 Device: Room Air  Therapy/Group: Individual Therapy  Janely Gullickson Melton Krebs 12/18/2019, 8:55 AM

## 2019-12-18 NOTE — Progress Notes (Signed)
[place] PHYSICAL MEDICINE & REHABILITATION PROGRESS NOTE  Subjective/Complaints: In bed. Says she talked to sister in [place] last night. No new complaints otherwise  ROS: Patient denies fever, rash, sore throat, blurred vision, nausea, vomiting, diarrhea, cough, shortness of breath or chest pain, joint or back pain,  or mood change. .   Objective: Vital Signs: Blood pressure 121/88, pulse 85, temperature 98.7 F (37.1 C), resp. rate 18, height 5\' 1"  (1.549 m), weight 54.9 kg, SpO2 100 %. No results found. Recent Labs    12/18/19 0552  WBC 5.9  HGB 12.2  HCT 36.6  PLT 349   Recent Labs    12/18/19 0552  NA 139  K 4.1  CL 102  CO2 23  GLUCOSE 99  BUN 15  CREATININE 0.62  CALCIUM 10.2    Physical Exam: BP 121/88 (BP Location: Right Arm)   Pulse 85   Temp 98.7 F (37.1 C)   Resp 18   Ht 5\' 1"  (1.549 m)   Wt 54.9 kg   SpO2 100%   BMI 22.86 kg/m  Constitutional: No distress . Vital signs reviewed. HEENT: EOMI, oral membranes moist Neck: supple Cardiovascular: RRR without murmur. No JVD    Respiratory: CTA Bilaterally without wheezes or rales. Normal effort    GI: BS +, non-tender, non-distended   Skin: scalp incision CDI Psych: more alert. Remains distracted.  Musc: No edema in extremities.  No tenderness in extremities. Neurological: oriented to person, place. Follows simple commands. Normal language.  Decreased pain sense left arm and leg. LUE 0/5. LLE HF and KE 1+ to 2/5. 0/5 ADF/PF. Mild tremor.  Assessment/Plan: 1. Functional deficits secondary to subarachnoid hemorrhage/hematoma status post right frontotemporal craniotomy which require 3+ hours per day of interdisciplinary therapy in a comprehensive inpatient rehab setting.  Physiatrist is providing close team supervision and 24 hour management of active medical problems listed below.  Physiatrist and rehab team continue to assess barriers to discharge/monitor patient progress toward functional and  medical goals  Care Tool:  Bathing    Body parts bathed by patient: Chest, Abdomen, Front perineal area, Right upper leg, Left upper leg, Face, Left arm, Left lower leg, Right lower leg   Body parts bathed by helper: Buttocks, Right arm     Bathing assist Assist Level: Moderate Assistance - Patient 50 - 74%     Upper Body Dressing/Undressing Upper body dressing   What is the patient wearing?: Pull over shirt    Upper body assist Assist Level: Minimal Assistance - Patient > 75%    Lower Body Dressing/Undressing Lower body dressing      What is the patient wearing?: Incontinence brief, Pants     Lower body assist Assist for lower body dressing: Moderate Assistance - Patient 50 - 74%     Toileting Toileting    Toileting assist Assist for toileting: Maximal Assistance - Patient 25 - 49%     Transfers Chair/bed transfer  Transfers assist     Chair/bed transfer assist level: Minimal Assistance - Patient > 75%     Locomotion Ambulation   Ambulation assist      Assist level: Maximal Assistance - Patient 25 - 49% Assistive device: Walker-rolling Max distance: 15'   Walk 10 feet activity   Assist     Assist level: Maximal Assistance - Patient 25 - 49% Assistive device: Walker-rolling   Walk 50 feet activity   Assist Walk 50 feet with 2 turns activity did not occur: Safety/medical concerns  Walk 150 feet activity   Assist Walk 150 feet activity did not occur: Safety/medical concerns         Walk 10 feet on uneven surface  activity   Assist Walk 10 feet on uneven surfaces activity did not occur: Safety/medical concerns         Wheelchair     Assist Will patient use wheelchair at discharge?: (TBD) Type of Wheelchair: Manual    Wheelchair assist level: Minimal Assistance - Patient > 75% Max wheelchair distance: 150'    Wheelchair 50 feet with 2 turns activity    Assist        Assist Level: Minimal Assistance  - Patient > 75%   Wheelchair 150 feet activity     Assist     Assist Level: Minimal Assistance - Patient > 75%      1.  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  Antithrombotics: -DVT/anticoagulation: Subcutaneous Lovenox initiated 11/22/2019.  no bleeding             -antiplatelet therapy: N/A 3. Pain Management: Oxycodone as needed  1/31: pain well controlled  4. Mood: Xanax 1 mg every 6 hours as needed---limit as possible             -antipsychotic agents: N/A 5. Neuropsych: This patient is not capable of making decisions on her own behalf.  -1/29 continue ritalin trial for attention and focus, attention improving  1/30-2/1 continues to have visual/auditory hallucinations, delusions   -continue to orient patient, neuro-psych follow up   -really don't want to start scheduled med for these as they seem fairly self-contained. However they have been a problem at times   -d/w team tomorrow 6. Skin/Wound Care: Routine skin checks 7. Fluids/Electrolytes/Nutrition: Routine in and outs.    -NG feeds decreased at night to help with appetite   -protein for low albumin  -changed IVF to HS only to help with therapies  -continue NGT for now as she just isn't eating consistently enough  1/23 -added megace    1/24- eating little  1/25: states that she doesn't eat much at baseline, daughter corroborates. Would like NGT removed; will discuss with SLP.  2/1 diet upgraded to D2, continue to encourage PO 8. Seizure prophylaxis.   Continue Keppra 1000 mg twice daily 9.  Post stroke dysphagia:    -continue Dysphagia #1 thins liquids.   -advance per speech therapy  -on PO alone currently 10. Hypertension.  Completed course of  Nimotop.                fair to good control 1/24-29, keep an eye on DBP  1/30, 1/31 well controlled.  11. Hypokalemia  I personally reviewed the patient's labs today.    K+ 4.1 2/1 12. Increased LFT's. Likely reactive  -resolved, as of 2/1 LFT's normal.      14. Constipation: 1/31: requests suppository; ordered.         LOS: 16 days A FACE TO FACE EVALUATION WAS PERFORMED  Ranelle Oyster 12/18/2019, 11:23 AM

## 2019-12-18 NOTE — Plan of Care (Signed)
  Problem: RH Problem Solving Goal: LTG Patient will demonstrate problem solving for (SLP) Description: LTG:  Patient will demonstrate problem solving for basic/complex daily situations with cues  (SLP) Flowsheets (Taken 12/18/2019 1520) LTG Patient will demonstrate problem solving for: Moderate Assistance - Patient 50 - 74% Note: Donwgraded 2/1   Problem: RH Memory Goal: LTG Patient will use memory compensatory aids to (SLP) Description: LTG:  Patient will use memory compensatory aids to recall biographical/new, daily complex information with cues (SLP) Flowsheets (Taken 12/18/2019 1520) LTG: Patient will use memory compensatory aids to (SLP): Moderate Assistance - Patient 50 - 74% Note: Downgraded 2/1   Problem: RH Awareness Goal: LTG: Patient will demonstrate awareness during functional activites type of (SLP) Description: LTG: Patient will demonstrate awareness during functional activites type of (SLP) Flowsheets (Taken 12/18/2019 1520) LTG: Patient will demonstrate awareness during cognitive/linguistic activities with assistance of (SLP): Moderate Assistance - Patient 50 - 74% Note: Downgraded 2/1

## 2019-12-18 NOTE — Progress Notes (Signed)
Speech Language Pathology Daily Session Note  Patient Details  Name: Olivia Mann MRN: [id number] Date of Birth: 08-23-1956  Today's Date: 12/18/2019 SLP Individual Time: 4235-3614 SLP Individual Time Calculation (min): 55 min  Short Term Goals: Week 3: SLP Short Term Goal 1 (Week 3): Pt will sustain her attention to basic, familiar tasks for 30 minutes with mod verbal cues for redirection. SLP Short Term Goal 2 (Week 3): Pt will complete basic, familiar tasks wtih min assist for functional problem solving SLP Short Term Goal 3 (Week 3): Pt will locate items at midline during functional tasks with Mod assist verbal and visual cues in 75% of opportunities. SLP Short Term Goal 4 (Week 3): Pt will consume current diet with minimal overt s/s of aspiration with Min A verbal cues for use of swallowing compensatory strategies. SLP Short Term Goal 5 (Week 3): Pt will consume therapeutic trials of dys 3 textures with min cues for use of swallowing precautions and minimal overt s/s of aspiration over 3 consecutive sessions prior to advancement.  Skilled Therapeutic Interventions: Skilled treatment session focused on cognitive goals. SLP facilitated session by providing extra time and Mod A verbal cues for visual scanning during a basic calendar making task. Mod A verbal cues were also required for visual scanning during an appointment making task in regards to writing appointments down on a specific day. Patient demonstrated sustained attention to tasks for ~45 minutes with Min verbal cues for redirection. Patient appears to demonstrate improved attention and awareness, therefore, will attempt a trial of patient sitting upright tin the wheelchair with alarm on and a telesitter in the room alone. Continue with current plan of care.      Pain No/Denies Pain   Therapy/Group: Individual Therapy  [name] 12/18/2019, 3:15 PM

## 2019-12-18 NOTE — Progress Notes (Signed)
Nutrition Follow-up  DOCUMENTATION CODES:   Not applicable  INTERVENTION:   Continue Ensure Enlive po TID, each supplement provides 350 kcal and 20 grams of protein  Continue 48mL Pro-stat BID, each serving provides 100kcal 15g protein   Continue Magic cup BID with meals, each supplement provides 290 kcal and 9 grams of protein   NUTRITION DIAGNOSIS:   Increased nutrient needs related to post-op healing, other (see comment)(therapies) as evidenced by estimated needs.  Ongoing.  GOAL:   Patient will meet greater than or equal to 90% of their needs  Progressing.  MONITOR:   PO intake, Supplement acceptance, Diet advancement, Labs, Weight trends  REASON FOR ASSESSMENT:   Consult Enteral/tube feeding initiation and management  ASSESSMENT:   64 year old female with unremarkable PMH. Pt presented on 11/18/19 with seizures and headaches. Cranial CT revealed SAH. 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 Pt underwent right frontotemporal craniotomy for clipping of internal carotid artery aneurysm as well as evacuation of right temporal hematoma on 12/16/19. Hospital course complicated by dysphagia, and pt was started on Dyshagia 1 diet with thin liquids as well as nasogastric tube feeds for nutritional support. Pt admitted to CIR on 1/16.   1/26 - Cortrak removed  Pt's wt has been stable since admission to CIR. Pt denies wt loss PTA and reports UBW 120-125 lbs. Pt reports appetite is fine.  RN reports pt consuming Ensure Enlive, [employer], and Pro-stat well.   Diet Order: DYS 2 with thin liquids PO intake: 25-100% x last 8 recorded meals (78% average meal intake)  Medications reviewed and include: Colace, Ensure Enlive TID, 30 ml Pro-stat BID, SSI, Megace  Labs reviewed: Corrected Ca 10.68 (H) CBGs 89-109   NUTRITION - FOCUSED PHYSICAL EXAM:    Most Recent Value  Orbital Region  No depletion   Upper Arm Region  Mild depletion  Thoracic and Lumbar Region  Mild depletion  Buccal Region  Mild depletion  Temple Region  Mild depletion  Clavicle Bone Region  Mild depletion  Clavicle and Acromion Bone Region  Mild depletion  Scapular Bone Region  Mild depletion  Dorsal Hand  No depletion  Patellar Region  No depletion  Anterior Thigh Region  Mild depletion  Posterior Calf Region  Mild depletion  Edema (RD Assessment)  None  Hair  Reviewed  Eyes  Reviewed  Mouth  Reviewed  Skin  Reviewed  Nails  Reviewed       Diet Order:   Diet Order            DIET DYS 2 Room service appropriate? Yes; Fluid consistency: Thin  Diet effective 1000              EDUCATION NEEDS:   No education needs have been identified at this time  Skin:  Skin Assessment: Skin Integrity Issues: Skin Integrity Issues:: Incisions Incisions: head  Last BM:  12/17/19  Height:   Ht Readings from Last 1 Encounters:  12/02/19 5\' 1"  (1.549 m)    Weight:   Wt Readings from Last 1 Encounters:  12/16/19 54.9 kg    Ideal Body Weight:  47.7 kg  BMI:  Body mass index is 22.86 kg/m.  Estimated Nutritional Needs:   Kcal:  1700-1900  Protein:  85-100 grams  Fluid:  >/= 1.7 L    12/18/19, MS, RD, LDN Pager: [phone] Weekend/After Hours Pager: [phone]

## 2019-12-18 NOTE — Plan of Care (Signed)
  Problem: Consults Goal: RH GENERAL PATIENT EDUCATION Description: See Patient Education module for education specifics. Outcome: Progressing Goal: Skin Care Protocol Initiated - if Braden Score 18 or less Description: If consults are not indicated, leave blank or document N/A Outcome: Progressing Goal: Nutrition Consult-if indicated Outcome: Progressing   Problem: RH BOWEL ELIMINATION Goal: RH STG MANAGE BOWEL WITH ASSISTANCE Description: STG Manage Bowel with min Assistance. Outcome: Progressing   Problem: RH BLADDER ELIMINATION Goal: RH STG MANAGE BLADDER WITH ASSISTANCE Description: STG Manage Bladder With min Assistance Outcome: Progressing   Problem: RH SAFETY Goal: RH STG ADHERE TO SAFETY PRECAUTIONS W/ASSISTANCE/DEVICE Description: STG Adhere to Safety Precautions With cues and reminders Assistance/Device. Outcome: Progressing Goal: RH STG DECREASED RISK OF FALL WITH ASSISTANCE Description: STG Decreased Risk of Fall With cues and reminders Assistance. Outcome: Progressing   Problem: RH PAIN MANAGEMENT Goal: RH STG PAIN MANAGED AT OR BELOW PT'S PAIN GOAL Description: Pain level less that 4 on scale of 0-10 Outcome: Progressing   Problem: RH KNOWLEDGE DEFICIT GENERAL Goal: RH STG INCREASE KNOWLEDGE OF SELF CARE AFTER HOSPITALIZATION Description: Pt will be able to adhere to medication regimen, dietary restrictions and lifestyle modification to prevent complications with mod I assist from family.  Outcome: Progressing   

## 2019-12-19 ENCOUNTER — Inpatient Hospital Stay (HOSPITAL_COMMUNITY): Payer: Self-pay

## 2019-12-19 ENCOUNTER — Inpatient Hospital Stay (HOSPITAL_COMMUNITY): Payer: Self-pay | Admitting: Physical Therapy

## 2019-12-19 ENCOUNTER — Inpatient Hospital Stay (HOSPITAL_COMMUNITY): Payer: Self-pay | Admitting: Speech Pathology

## 2019-12-19 LAB — GLUCOSE, CAPILLARY
Glucose-Capillary: 99 mg/dL (ref 70–99)
Glucose-Capillary: 91 mg/dL (ref 70–99)
Glucose-Capillary: 103 mg/dL — ABNORMAL HIGH (ref 70–99)

## 2019-12-19 MED ORDER — SORBITOL 70 % SOLN
60.0000 mL | Status: AC
  Administered 2019-12-19: 60 mL via ORAL
  Filled 2019-12-19: qty 60

## 2019-12-19 MED ORDER — SENNOSIDES-DOCUSATE SODIUM 8.6-50 MG PO TABS
2.0000 | ORAL_TABLET | Freq: Every day | ORAL | Status: DC
  Administered 2019-12-19 – 2019-12-21 (×3): 2 via ORAL
  Filled 2019-12-19 (×2): qty 2

## 2019-12-19 MED ORDER — OXYCODONE HCL 5 MG PO TABS
5.0000 mg | ORAL_TABLET | Freq: Four times a day (QID) | ORAL | Status: DC | PRN
  Administered 2019-12-20 – 2019-12-21 (×2): 5 mg via ORAL
  Filled 2019-12-19 (×2): qty 1

## 2019-12-19 MED ORDER — LEVETIRACETAM 500 MG PO TABS
1000.0000 mg | ORAL_TABLET | Freq: Two times a day (BID) | ORAL | Status: DC
  Administered 2019-12-19 – 2019-12-22 (×6): 1000 mg via ORAL
  Filled 2019-12-19 (×6): qty 2

## 2019-12-19 MED ORDER — MEGESTROL ACETATE 400 MG/10ML PO SUSP
400.0000 mg | Freq: Every day | ORAL | Status: DC
  Administered 2019-12-20 – 2019-12-22 (×3): 400 mg via ORAL
  Filled 2019-12-19 (×3): qty 10

## 2019-12-19 NOTE — Progress Notes (Signed)
Orthopedic Tech Progress Note Patient Details:  Olivia Mann October 24, 1956 [id number]  Ortho Devices Type of Ortho Device: Shoulder immobilizer Ortho Device/Splint Location: left Ortho Device/Splint Interventions: Application   Post Interventions Patient Tolerated: Well Instructions Provided: Care of device   Saul Fordyce 12/19/2019, 1:39 PM

## 2019-12-19 NOTE — Progress Notes (Signed)
Occupational Therapy Weekly Progress Note  Patient Details  Name: Olivia Mann MRN: [id number] Date of Birth: 12/06/1955  Beginning of progress report period: December 11, 2019 End of progress report period: December 19, 2019  Today's Date: 12/19/2019 OT Individual Time: 1350-1500 OT Individual Time Calculation (min): 70 min    Patient has met 2 of 3 short term goals.  Pt has made progress toward her OT goals this reporting period, however after discussion with family, pt and family agree pt is mentally not doing well being separated from family in the hospital. They are willing to take pt home at a min-mod A level and several goals have been downgraded to reflect this. Pt has had some L scapular and proximal shoulder activation but no activation distally. Pt still requires min-mod A for ADLs overall.   Patient continues to demonstrate the following deficits: muscle weakness, unbalanced muscle activation, decreased coordination and decreased motor planning, decreased midline orientation and decreased attention to left, decreased initiation, decreased attention, decreased awareness, decreased problem solving, decreased safety awareness, decreased memory and delayed processing and decreased sitting balance, decreased standing balance, decreased postural control, hemiplegia and decreased balance strategies and therefore will continue to benefit from skilled OT intervention to enhance overall performance with BADL.  Patient not progressing toward long term goals.  See goal revision..  Plan of care revisions: Several goals downgraded to mod A to reflect pt's family being willing to take pt home at a lower level .  OT Short Term Goals Week 2:  OT Short Term Goal 1 (Week 2): Pt will utilize compensatory methods to locate 2 ADL items at midline with mod cueing OT Short Term Goal 1 - Progress (Week 2): Met OT Short Term Goal 2 (Week 2): Pt will don shirt with min A OT Short Term Goal 2 - Progress (Week 2):  Progressing toward goal OT Short Term Goal 3 (Week 2): Pt will complete 2/3 toileting tasks with mod A OT Short Term Goal 3 - Progress (Week 2): Met Week 3:  OT Short Term Goal 1 (Week 3): STG= LTG d/t ELOS  Skilled Therapeutic Interventions/Progress Updates:    Pt received sitting up in the recliner with no c/o pain, agreeable to attempt toileting and shower. Pt completed stand pivot transfer to the toilet with use of grab bar with min A. Pt required mod A for toileting tasks overall. Several times pt flushed toilet so OT was unable to endorse whether pt truly had BM or not but she was asking for RN to also give a suppository. Pt completed 4 ft of functional mobility into the shower with mod-max A, no AE. Pt required mod facilitation for LLE. Pt completed UB bathing with min A. Mod A for LB bathing, to support LLE in figure 4 position and to support balance in standing. Pt donned shirt with mod A overall but good carryover of hemi technique. Mod A to don pants, to support LLE in figure 4 and to support balance in standing. Pt completed oral care at the sink with min cueing to locate toothbrush on far L side of sink- big improvement! Pt completed oral care with only set up assist. Pt was taken down to the therapy gym via w/c. She completed stand pivot transfer to the mat with min A. Pt completed functional reaching task with LUE in extended weight bearing with mod-max facilitation required throughout. Pt demo'ed improvement in crossing midline and L scanning. Pt returned to her room and was assisted back to  supine, left with all needs met, bed alarm set.   Therapy Documentation Precautions:  Precautions Precautions: Fall Precaution Comments: L neglect, L hemiparesis Restrictions Weight Bearing Restrictions: No   Therapy/Group: Individual Therapy   H  12/19/2019, 10:25 AM  

## 2019-12-19 NOTE — Plan of Care (Signed)
  Problem: RH Ambulation Goal: LTG Patient will ambulate in controlled environment (PT) Description: LTG: Patient will ambulate in a controlled environment, # of feet with assistance (PT). Flowsheets Taken 12/19/2019 1407 LTG: Pt will ambulate in controlled environ  assist needed:: (goal downgraded 2/2 due to lack of progress - AAT) Moderate Assistance - Patient 50 - 74% Taken 12/15/2019 1246 LTG: Ambulation distance in controlled environment: goal downgraded 1/29 due to lack of progress - AAT Note: goal downgraded 2/2 due to lack of progress - AAT   Problem: RH Stairs Goal: LTG Patient will ambulate up and down stairs w/assist (PT) Description: LTG: Patient will ambulate up and down # of stairs with assistance (PT) Flowsheets (Taken 12/19/2019 1407) LTG: Pt will ambulate up/down stairs assist needed:: (goal discontinued 2/2 due to lack of progress - AAT) -- Note: goal discontinued 2/2 due to lack of progress - AAT

## 2019-12-19 NOTE — Progress Notes (Signed)
Speech Language Pathology Daily Session Note  Patient Details  Name: Olivia Mann MRN: [id number] Date of Birth: [birth year]  Today's Date: 12/19/2019 SLP Individual Time: 0900-0955 SLP Individual Time Calculation (min): 55 min  Short Term Goals: Week 3: SLP Short Term Goal 1 (Week 3): Pt will sustain her attention to basic, familiar tasks for 30 minutes with mod verbal cues for redirection. SLP Short Term Goal 2 (Week 3): Pt will complete basic, familiar tasks wtih min assist for functional problem solving SLP Short Term Goal 3 (Week 3): Pt will locate items at midline during functional tasks with Mod assist verbal and visual cues in 75% of opportunities. SLP Short Term Goal 4 (Week 3): Pt will consume current diet with minimal overt s/s of aspiration with Min A verbal cues for use of swallowing compensatory strategies. SLP Short Term Goal 5 (Week 3): Pt will consume therapeutic trials of dys 3 textures with min cues for use of swallowing precautions and minimal overt s/s of aspiration over 3 consecutive sessions prior to advancement.  Skilled Therapeutic Interventions: Skilled treatment session focused on cognitive goals. SLP facilitated session by providing Min A verbal cues for recall of her current medications and their functions. Initially, patient required Max verbal cues for problem solving, emergent awareness of errors, organization and visual scanning while organizing a BID pill box, however, cues faded to Min A by end of task. Patient continues to be mildly perseverative on her early discharge with poor awareness of deficits and their impact on her overall function. Patient requested assistance for using her phone and required Mod verbal cues for navigation in regards to finding apps on her phone. Patient left upright in wheelchair with alarm on and all needs within reach. Continue with current plan of care.       Pain Pain Assessment Pain Scale: 0-10 Pain Score: 0-No  pain  Therapy/Group: Individual Therapy  Gehrig Patras 12/19/2019, 10:05 AM

## 2019-12-19 NOTE — Patient Care Conference (Signed)
Inpatient RehabilitationTeam Conference and Plan of Care Update Date: 12/19/2019   Time: 10:15 AM   Patient Name: Olivia Mann      Medical Record Number: [id number]  Date of Birth: 12/22/1955 Sex: Female         Room/Bed: 4W23C/4W23C-01 Payor Info: Payor: MEDICAID POTENTIAL / Plan: MEDICAID POTENTIAL / Product Type: *No Product type* /    Admit Date/Time:  12/02/2019 12:11 AM  Primary Diagnosis:  SAH (subarachnoid hemorrhage) (HCC)  Patient Active Problem List   Diagnosis Date Noted  . Hypokalemia   . Dysphagia, post-stroke   . Seizure prophylaxis   . Hemiparesis affecting left side as late effect of stroke (HCC)   . SAH (subarachnoid hemorrhage) (HCC)   . Tachypnea   . Tachycardia   . Hypertension   . Leukocytosis   . Cytotoxic brain edema (HCC) 11/20/2019  . Seizures (HCC) 11/20/2019  . Subarachnoid hemorrhage (HCC)   . Endotracheally intubated   . Macrocytosis without anemia   . Encephalopathy acute   . Subarachnoid hematoma (HCC) 11/18/2019    Expected Discharge Date: Expected Discharge Date: 12/22/19  Team Members Present: Physician leading conference: Dr. Faith Rogue Social Worker Present: Amada Jupiter, LCSW(Auria Lula Olszewski, SW) Nurse Present: Otilio Carpen, RN Case Manager: Roderic Palau, RN PT Present: Carlynn Purl, PT OT Present: Jake Shark, OT SLP Present: Feliberto Gottron, SLP PPS Coordinator present : Fae Pippin, [place], PT     Current Status/Progress Goal Weekly Team Focus  Bowel/Bladder   Pt continent of B/B  Pt will remain continent of B/B  Q2 toileting/PRN   Swallow/Nutrition/ Hydration   Dys. 2 textures with thin liquids, Min A  Supervision  Family Education   ADL's   Mod A UB dressing/bathing, min-mod transfers, activation in L shoulder and scapula, nothing distally.  min A- CGA overall  ADL retraining, ADL transfers, family edu, cognitive retraining   Mobility   mod assist transfers, mod-max assist gait w/ RW x20', gait w/ LUE  orthosis and LAFO, mod-max dynamic standing balance, min assist w/c mobility  CGA-min assist overall, w/c level, do not recommend pt ambulate w/ caregiver at d/c  LLE NMR, progressing gait, w/c mobility, cognitive remediation   Communication             Safety/Cognition/ Behavioral Observations  Mod A  Min-Mod A  attention, visual scanning, awareness, problem solving and family education   Pain      Pt denies pain      Skin      pt will remain free of skin breakdown and infection.  Assess skin per shift/prn    Rehab Goals Patient on target to meet rehab goals: Yes Rehab Goals Revised: should meet revised goals - some downgraded due to pt/family request for earlier d/c and in agreement with change in level of care needs. *See Care Plan and progress notes for long and short-term goals.     Barriers to Discharge  Current Status/Progress Possible Resolutions Date Resolved   Nursing                  PT                    OT                  SLP                SW  Discharge Planning/Teaching Needs:  Plan to return home with daughter and son in [place] who can provide 24/7 assistance  Teaching begun this week   Team Discussion: On PO now, agitated, delusional behavior evenings, seroquel prn agitation, megace for appetite, Ritalin for alertness.  RN wants to go home, forgetful, cont B/B, DC BSs.  OT mod UB B/D, min/mod transfers, goals min/CGA, downgrade goals to mod A.  PT min bed/transfers, max gait or standing balance, downgraded to mod A gait goal, w/c level goals min A.  SLP D2thins.  Home with Dtr, fam ed to be scheduled.   Revisions to Treatment Plan: N/A     Medical Summary Current Status: improving cognitively but still confused and agitated at times. delusional occasionally. eating better. perseverates on going home Weekly Focus/Goal: simplifying meds, finalizing dc medical plan. orthotic mgt of RUE and RLE  Barriers to Discharge: Behavior   Possible  Resolutions to Barriers: continued medical mgt, family ed   Continued Need for Acute Rehabilitation Level of Care: The patient requires daily medical management by a physician with specialized training in physical medicine and rehabilitation for the following reasons: Direction of a multidisciplinary physical rehabilitation program to maximize functional independence : Yes Medical management of patient stability for increased activity during participation in an intensive rehabilitation regime.: Yes Analysis of laboratory values and/or radiology reports with any subsequent need for medication adjustment and/or medical intervention. : Yes   I attest that I was present, lead the team conference, and concur with the assessment and plan of the team.   Jodell Cipro M 12/19/2019, 7:19 PM   Team conference was held via web/ teleconference due to [place] - 19

## 2019-12-19 NOTE — Progress Notes (Signed)
  Patient ID: Olivia Mann, female   DOB: Mar 23, 1956, 64 y.o.   MRN: [id number]    Diagnosis codes:  I60.9  Height:    5'1"            Weight:    126 lbs        Patient suffers from subarachnoid hemorrhage which impairs her ability to perform daily activities like bathing, dressing and mobility in the home.   A wheelchair will allow patient to safely perform daily activities.  Patient is not able to propel herself in the home using a standard weight wheelchair due to hemiparesis and debility.  Patient can self propel in the lightweight wheelchair.  Mariam Dollar, PA-C

## 2019-12-19 NOTE — Progress Notes (Signed)
[facility] PHYSICAL MEDICINE & REHABILITATION PROGRESS NOTE  Subjective/Complaints: Up in bed. Still fixated on going home. No new complaints otherwise  ROS: Limited due to cognitive/behavioral   Objective: Vital Signs: Blood pressure (!) 130/99, pulse 93, temperature 98.3 F (36.8 C), resp. rate 18, height 5\' 1"  (1.549 m), weight 54.4 kg, SpO2 99 %. No results found. Recent Labs    12/18/19 0552  WBC 5.9  HGB 12.2  HCT 36.6  PLT 349   Recent Labs    12/18/19 0552  NA 139  K 4.1  CL 102  CO2 23  GLUCOSE 99  BUN 15  CREATININE 0.62  CALCIUM 10.2    Physical Exam: BP (!) 130/99 (BP Location: Right Arm)   Pulse 93   Temp 98.3 F (36.8 C) Comment: pt used restroom.  Resp 18   Ht 5\' 1"  (1.549 m)   Wt 54.4 kg   SpO2 99%   BMI 22.67 kg/m  Constitutional: No distress . Vital signs reviewed. HEENT: EOMI, oral membranes moist Neck: supple Cardiovascular: RRR without murmur. No JVD    Respiratory: CTA Bilaterally without wheezes or rales. Normal effort    GI: BS +, non-tender, non-distended  Skin: scalp incision CDI Psych: alert, some delusional thoughts. STM deficits.  Musc: No edema in extremities.  No tenderness in extremities. Neurological: oriented to person, place. Follows simple commands. Normal language.  Decreased pain sense left arm and leg. LUE 0/5 except for limited shoulder movement. LLE HF and KE 1+ to 2/5. 0/5 ADF/PF.  Left inattention  Assessment/Plan: 1. Functional deficits secondary to subarachnoid hemorrhage/hematoma status post right frontotemporal craniotomy which require 3+ hours per day of interdisciplinary therapy in a comprehensive inpatient rehab setting.  Physiatrist is providing close team supervision and 24 hour management of active medical problems listed below.  Physiatrist and rehab team continue to assess barriers to discharge/monitor patient progress toward functional and medical goals  Care Tool:  Bathing    Body parts bathed  by patient: Chest, Abdomen, Front perineal area, Right upper leg, Left upper leg, Face, Left arm, Left lower leg, Right lower leg   Body parts bathed by helper: Buttocks, Right arm     Bathing assist Assist Level: Moderate Assistance - Patient 50 - 74%     Upper Body Dressing/Undressing Upper body dressing   What is the patient wearing?: Pull over shirt    Upper body assist Assist Level: Minimal Assistance - Patient > 75%    Lower Body Dressing/Undressing Lower body dressing      What is the patient wearing?: Incontinence brief, Pants     Lower body assist Assist for lower body dressing: Moderate Assistance - Patient 50 - 74%     Toileting Toileting    Toileting assist Assist for toileting: Maximal Assistance - Patient 25 - 49%     Transfers Chair/bed transfer  Transfers assist     Chair/bed transfer assist level: Minimal Assistance - Patient > 75%     Locomotion Ambulation   Ambulation assist      Assist level: Maximal Assistance - Patient 25 - 49% Assistive device: Walker-rolling Max distance: 15'   Walk 10 feet activity   Assist     Assist level: Maximal Assistance - Patient 25 - 49% Assistive device: Walker-rolling   Walk 50 feet activity   Assist Walk 50 feet with 2 turns activity did not occur: Safety/medical concerns         Walk 150 feet activity   Assist Walk  150 feet activity did not occur: Safety/medical concerns         Walk 10 feet on uneven surface  activity   Assist Walk 10 feet on uneven surfaces activity did not occur: Safety/medical concerns         Wheelchair     Assist Will patient use wheelchair at discharge?: (TBD) Type of Wheelchair: Manual    Wheelchair assist level: Minimal Assistance - Patient > 75% Max wheelchair distance: 150'    Wheelchair 50 feet with 2 turns activity    Assist        Assist Level: Minimal Assistance - Patient > 75%   Wheelchair 150 feet activity      Assist     Assist Level: Minimal Assistance - Patient > 75%      1.  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  Status post right frontotemporal craniotomy for clipping of internal carotid artery aneurysm as well as evacuation of right temporal hematoma 11/19/2019  Continue CIR PT, OT, SLP  -Interdisciplinary Team Conference today    -  AFO LLE for gait, PRAFO/WHO at rest  -plan is for family ed and home on Friday   2.  Antithrombotics: -DVT/anticoagulation: Subcutaneous Lovenox initiated 11/22/2019.  no bleeding             -antiplatelet therapy: N/A 3. Pain Management: Oxycodone as needed  1/31: pain well controlled  4. Mood: Xanax 1 mg every 6 hours as needed---limit as possible             -antipsychotic agents: prn seroquel 5. Neuropsych: This patient is not capable of making decisions on her own behalf.  -1/29 continue ritalin trial for attention and focus, attention improving  1/30-2/1 continues to have visual/auditory hallucinations, delusions   -continue to orient patient, neuro-psych follow up   -would like to avoid scheduled meds to control mood if possible 6. Skin/Wound Care: Routine skin checks 7. Fluids/Electrolytes/Nutrition: Routine in and outs.    -NG feeds decreased at night to help with appetite   -protein for low albumin  -changed IVF to HS only to help with therapies  -continue NGT for now as she just isn't eating consistently enough  1/23 -added megace    1/24- eating little  1/25: states that she doesn't eat much at baseline, daughter corroborates. Would like NGT removed; will discuss with SLP.  2/1 diet upgraded to D2, continue to encourage PO 8. Seizure prophylaxis.   Continue Keppra 1000 mg twice daily 9.  Post stroke dysphagia:    -continue Dysphagia #1 thins liquids.   -advance per speech therapy  -on PO alone currently 10. Hypertension.  Completed course of Nimotop.                2/2 DBP with elevation---no  changes to plan at present. Likely up when she becomes agitated 11. Hypokalemia  I personally reviewed the patient's labs today.    K+ 4.1 2/1 12. Increased LFT's. Likely reactive  -resolved, as of 2/1 LFT's normal.      14. Constipation: no bm since 1/29.    2/2 Sorbitol today     -senna-s 2 tabs at hs      LOS: 17 days A FACE TO FACE EVALUATION WAS PERFORMED  Ranelle Oyster 12/19/2019, 12:45 PM

## 2019-12-19 NOTE — Progress Notes (Signed)
Physical Therapy Session Note  Patient Details  Name: Olivia Mann MRN: [id number] Date of Birth: [birth year]  Today's Date: 12/19/2019 PT Individual Time: 1115-1155 PT Individual Time Calculation (min): 40 min   Short Term Goals: Week 3:  PT Short Term Goal 1 (Week 3): Pt will initiate stair training PT Short Term Goal 2 (Week 3): Pt will ambulate 19' w/ mod assist w/ LRAD PT Short Term Goal 3 (Week 3): Pt will maintain dynamic standing balance w/ mod assist consistently  Skilled Therapeutic Interventions/Progress Updates:   Pt received in w/c and agreeable to therapy, no c/o pain. Total assist w/c transport to/from therapy gym. Worked on postural control in stance w/ lateral weight shifting activities, no UE support. Stood w/ min assist to reach towards R and L sides and then toss horseshoe. CGA for R lateral weight shifting and min assist to maintain balance w/ L lateral weight shifting. Tactile and verbal cues to maintain upright and prevent L LOB. Pt verbalizes that she is leaning to the L, but unable to carryover and correct in the moment w/o assist. Mirror for visual feedback while performing static lateral weight shifting in full upright, able to perform this w/ CGA. Performed 5+ reps of standing. Performed NuStep 5 min x2 @ level 4 w/ BLEs only to work on LLE activation, L body awareness, and attention to task. Pt w/ much improved ability to attend to task and to fully extend LLE. Max manual assist needed to maintain neutral LLE alignment. Returned to room and ended session in w/c, all needs in reach. Called pt's daughter to confirm her bringing in new shoes for L toe cap, daughter confirms she is.   Therapy Documentation Precautions:  Precautions Precautions: Fall Precaution Comments: L neglect, L hemiparesis Restrictions Weight Bearing Restrictions: No Pain: Pain Assessment Pain Scale: 0-10 Pain Score: 0-No pain  Therapy/Group: Individual Therapy  Lorene Samaan Melton Krebs 12/19/2019, 12:08  PM

## 2019-12-20 ENCOUNTER — Inpatient Hospital Stay (HOSPITAL_COMMUNITY): Payer: Self-pay | Admitting: Physical Therapy

## 2019-12-20 ENCOUNTER — Encounter (HOSPITAL_COMMUNITY): Payer: Self-pay | Admitting: Speech Pathology

## 2019-12-20 ENCOUNTER — Encounter (HOSPITAL_COMMUNITY): Payer: Self-pay

## 2019-12-20 LAB — GLUCOSE, CAPILLARY: Glucose-Capillary: 99 mg/dL (ref 70–99)

## 2019-12-20 NOTE — Progress Notes (Signed)
   12/20/19 1240  What Happened  Was fall witnessed? No  Patients activity before fall ambulating-unassisted  Point of contact buttocks  Was patient injured? No  Patient found on floor  Found by Staff-comment Zollie Scale, RN)  Stated prior activity ambulating-unassisted  Follow Up  MD notified Deatra Ina, PA  Time MD notified [phone]  Family notified No - patient refusal  Additional tests No  Progress note created (see row info) Yes  Adult Fall Risk Assessment  Risk Factor Category (scoring not indicated) Fall has occurred during this admission (document High fall risk)  Adult Fall Risk Interventions  Required Bundle Interventions *See Row Information* High fall risk - low, moderate, and high requirements implemented  Additional Interventions Camera surveillance (with patient/family notification & education);PT/OT need assessed if change in mobility from baseline;Lap belt while in chair/wheelchair;Use of appropriate toileting equipment (bedpan, BSC, etc.)  Screening for Fall Injury Risk (To be completed on HIGH fall risk patients) - Assessing Need for Low Bed  Risk For Fall Injury- Low Bed Criteria TeleSitter Camera in use  Screening for Fall Injury Risk (To be completed on HIGH fall risk patients who do not meet crieteria for Low Bed) - Assessing Need for Floor Mats Only  Risk For Fall Injury- Criteria for Floor Mats Confusion/dementia (+NuDESC, CIWA, TBI, etc.)  Pain Assessment  Pain Scale 0-10  Pain Score 0  Neurological  Neuro (WDL) X (status same as prior to fall)

## 2019-12-20 NOTE — Progress Notes (Signed)
Social Work Patient ID: Olivia Mann, female   DOB: 11/05/1956, 64 y.o.   MRN: [id number]   Met with pt and daughter, [name], yesterday to review team conference.  Both aware that d/c date changed to 2/5 per pt and family request and that goals downgraded due to shortened LOS.  Family ed took place this morning.  Plan for pt to go home to [place] on Friday and I have placed DME order with company in that area.  Still to make arrangements for [facility] follow up.  Tesa Meadors, LCSW

## 2019-12-20 NOTE — Plan of Care (Signed)
  Problem: Consults Goal: RH GENERAL PATIENT EDUCATION Description: See Patient Education module for education specifics. Outcome: Progressing Goal: Skin Care Protocol Initiated - if Braden Score 18 or less Description: If consults are not indicated, leave blank or document N/A Outcome: Progressing Goal: Nutrition Consult-if indicated Outcome: Progressing   Problem: RH BOWEL ELIMINATION Goal: RH STG MANAGE BOWEL WITH ASSISTANCE Description: STG Manage Bowel with min Assistance. Outcome: Progressing   Problem: RH BLADDER ELIMINATION Goal: RH STG MANAGE BLADDER WITH ASSISTANCE Description: STG Manage Bladder With min Assistance Outcome: Progressing   Problem: RH SAFETY Goal: RH STG ADHERE TO SAFETY PRECAUTIONS W/ASSISTANCE/DEVICE Description: STG Adhere to Safety Precautions With cues and reminders Assistance/Device. Outcome: Progressing Goal: RH STG DECREASED RISK OF FALL WITH ASSISTANCE Description: STG Decreased Risk of Fall With cues and reminders Assistance. Outcome: Progressing   Problem: RH KNOWLEDGE DEFICIT GENERAL Goal: RH STG INCREASE KNOWLEDGE OF SELF CARE AFTER HOSPITALIZATION Description: Pt will be able to adhere to medication regimen, dietary restrictions and lifestyle modification to prevent complications with mod I assist from family.  Outcome: Progressing   Problem: RH PAIN MANAGEMENT Goal: RH STG PAIN MANAGED AT OR BELOW PT'S PAIN GOAL Description: Pain level less that 4 on scale of 0-10 Outcome: Progressing

## 2019-12-20 NOTE — Progress Notes (Signed)
Physical Therapy Session Note  Patient Details  Name: Olivia Mann MRN: [id number] Date of Birth: 21-Jan-1956  Today's Date: 12/20/2019 PT Individual Time: 1130-1155 AND 1300-1408 PT Individual Time Calculation (min): 25 min AND 68 min   Short Term Goals: Week 3:  PT Short Term Goal 1 (Week 3): Pt will initiate stair training PT Short Term Goal 2 (Week 3): Pt will ambulate 46' w/ mod assist w/ LRAD PT Short Term Goal 3 (Week 3): Pt will maintain dynamic standing balance w/ mod assist consistently  Skilled Therapeutic Interventions/Progress Updates:   Session 1:  Pt in w/c and agreeable to therapy, no c/o pain. Pt's daughters present for hands-on education. Both practiced multiple stand pivot transfers in each direction to/from EOB w/o assist from therapist, both provided appropriate cues. Emphasized importance of wearing shoes and LAFO for all mobility as this helps her to independently place LLE when pivoting. Educated both on technique and cues to provide for LLE placement. Discussed using same technique for all other transfers including car, BSC, and shower. Educated both on pt's L inattention and L body/environmental awareness and how this influences function. Educated both daughters on bed mobility technique and importance of getting rail for bed to remind pt she needs to call for help prior to getting OOB at home, however she has no specific need for a hospital bed. Both verbalized understanding and in agreement w/ all education. Both in agreement that pt is unsafe to ambulate at home at this time and that she requires close 24/7 supervision 2/2 impulsivity and poor awareness of deficits. Pt ended session in supine, all needs in reach.   Session 2:  Pt in supine and agreeable to therapy, no c/o pain. Asked pt about fall OOB she had during lunch. Pt stated she thought she could stand and reach something, but noticed her LLE was buckling and shw slowly lowered herself to the floor. Continued to  reinforce need for someone to assist her w/ all mobility. Pt stated "I wasn't thinking". Supine>sit w/ supervision and donned shoes and LAFO. Pt self-propelled w/c to/from therapy gym and around unit w/ supervision using R hemi technique. Continues to need occasional cues for L obstacle avoidance and attention to task. Improved ability to problem solve technique when she runs into items on L side. Worked on gait training in therapy gym w/ LUE orthosis and LAFO. Ambulated 25' x2 w/ mod assist for lateral weight shifting and RW management. Pt w/ improved ability to clear L foot/toe from floor. Verbal cues to attend to task and to environment. NuStep 5 min x2 @ level 4 w/ BLEs only to work on LLE activation, attention to task, and reciprocal movement pattern. Needed max-total assist to maintain neutral LLE alignment. Frequent verbal cues to attend to task. Practiced car transfer w/ min assist via stand pivot, verbal and tactile cues for technique and sequencing. Returned to room and ended session in supine, all needs in reach. Reinforced need to call for help prior to getting OOB.   Therapy Documentation Precautions:  Precautions Precautions: Fall Precaution Comments: L neglect, L hemiparesis Restrictions Weight Bearing Restrictions: No  Therapy/Group: Individual Therapy  Olivia Mann 12/20/2019, 12:11 PM

## 2019-12-20 NOTE — Discharge Summary (Signed)
Physician Discharge Summary  Patient ID: Olivia Mann MRN: [id number] DOB/AGE: [birth year] 64 y.o.  Admit date: 12/02/2019 Discharge date: 12/21/2018  Discharge Diagnoses:  Principal Problem:   SAH (subarachnoid hemorrhage) (HCC) Active Problems:   Hypokalemia DVT prophylaxis Seizure prophylaxis Dysphagia Hypertension Constipation Tobacco abuse  Discharged Condition: Stable  Significant Diagnostic Studies: DG Chest 2 View  Result Date: 12/05/2019 CLINICAL DATA:  Fever x1 day. EXAM: CHEST - 2 VIEW COMPARISON:  November 21, 2019 FINDINGS: A nasogastric tube is seen with its distal end extending below the level of the diaphragm. This represents a new finding when compared to the prior study. Very mild, hazy atelectasis and/or early infiltrate is seen within the right lung base. There is no evidence of a pleural effusion or pneumothorax. The heart size and mediastinal contours are within normal limits. The visualized skeletal structures are unremarkable. IMPRESSION: 1. Interval nasogastric tube placement and positioning, as described above, when compared to the prior study dated November 21, 2019. 2. Very mild, hazy right basilar atelectasis and/or infiltrate. This also represents a new finding when compared to the prior exam. Electronically Signed   By: Aram Candela M.D.   On: 12/05/2019 18:34   DG Swallowing Func-Speech Pathology  Result Date: 11/30/2019 Objective Swallowing Evaluation: Type of Study: MBS-Modified Barium Swallow Study  Patient Details Name: Olivia Mann MRN: [id number] Date of Birth: 11/18/1955 Today's Date: 11/30/2019 Time: SLP Start Time (ACUTE ONLY): 1101 -SLP Stop Time (ACUTE ONLY): 1122 SLP Time Calculation (min) (ACUTE ONLY): 21 min Past Medical History: No past medical history on file. Past Surgical History: Past Surgical History: Procedure Laterality Date . CRANIOTOMY Right 11/19/2019  Procedure: CRANIOTOMY CLIPPING OF CAROTID ANEURYSM;  Surgeon: Lisbeth Renshaw, MD;   Location: [facility] OR;  Service: Neurosurgery;  Laterality: Right; HPI: Pt is a 64 y.o. female with no significant PMHX presenting with headache, new onset seizures, agitation, and AMS. 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 ETT 1/3-1/4. No known PMH.  Subjective: pt is even more alert than she was for me upstairs Assessment / Plan / Recommendation CHL IP CLINICAL IMPRESSIONS 11/30/2019 Clinical Impression Pt has a mild-moderate dysphagia with primarily oral deficits given sensory and motor issues. She is very alert for the study, still needing Min cues to maintain upright positioning. She has reduced left-sided labial seal with anterior spillage primarily of thin liquids and soft solids. Most of a piece of graham cracker fell out of her mouth, appearing to be unmasticated. Her oral transit takes extra time even with purees and there is mild lingual residue, but with extra time she clears this well with a spontaneous second swallow. Pt's pharyngeal swallow was grossly functional, but intermittent premature spillage did result in trace amounts of penetration with thin liquids. This typically happened when she had larger amounts of liquid in her mouth, making it more challenging for her to contain it before she swallowed. Recommend starting with Dys 1 diet and thin liquids with assistance for use of precautions including small sips. Would make sure that she is alert and upright during meals as well. Nectar thick liquids were also tested and could be used as an alternative to thin liquids if performance fluctuates more at bedside. Pt's daughter Crist Infante was present in person for the study in agreement, and had her sister Bethann Berkshire join [place] through videochat as well. Will continue to follow.  SLP  Visit Diagnosis Dysphagia, oropharyngeal phase (R13.12) Attention and concentration deficit following -- Frontal lobe and executive  function deficit following -- Impact on safety and function Mild aspiration risk;Moderate aspiration risk   CHL IP TREATMENT RECOMMENDATION 11/30/2019 Treatment Recommendations Therapy as outlined in treatment plan below   Prognosis 11/30/2019 Prognosis for Safe Diet Advancement Good Barriers to Reach Goals Cognitive deficits Barriers/Prognosis Comment -- CHL IP DIET RECOMMENDATION 11/30/2019 SLP Diet Recommendations Dysphagia 1 (Puree) solids;Thin liquid Liquid Administration via Straw;Cup Medication Administration Crushed with puree Compensations Slow rate;Small sips/bites;Minimize environmental distractions;Lingual sweep for clearance of pocketing;Monitor for anterior loss;Other (Comment) Postural Changes Seated upright at 90 degrees   CHL IP OTHER RECOMMENDATIONS 11/30/2019 Recommended Consults -- Oral Care Recommendations Oral care BID Other Recommendations Have oral suction available   CHL IP FOLLOW UP RECOMMENDATIONS 11/30/2019 Follow up Recommendations Inpatient Rehab   CHL IP FREQUENCY AND DURATION 11/30/2019 Speech Therapy Frequency (ACUTE ONLY) min 2x/week Treatment Duration 2 weeks      CHL IP ORAL PHASE 11/30/2019 Oral Phase Impaired Oral - Pudding Teaspoon -- Oral - Pudding Cup -- Oral - Honey Teaspoon -- Oral - Honey Cup -- Oral - Nectar Teaspoon -- Oral - Nectar Cup -- Oral - Nectar Straw Delayed oral transit;Decreased bolus cohesion;Premature spillage Oral - Thin Teaspoon -- Oral - Thin Cup Delayed oral transit;Decreased bolus cohesion;Premature spillage;Left anterior bolus loss Oral - Thin Straw Delayed oral transit;Decreased bolus cohesion;Premature spillage Oral - Puree Delayed oral transit;Decreased bolus cohesion;Lingual/palatal residue Oral - Mech Soft Delayed oral transit;Decreased bolus cohesion;Left anterior bolus loss;Impaired mastication Oral - Regular -- Oral - Multi-Consistency -- Oral - Pill -- Oral Phase - Comment --  CHL IP PHARYNGEAL PHASE 11/30/2019 Pharyngeal Phase Impaired Pharyngeal-  Pudding Teaspoon -- Pharyngeal -- Pharyngeal- Pudding Cup -- Pharyngeal -- Pharyngeal- Honey Teaspoon -- Pharyngeal -- Pharyngeal- Honey Cup -- Pharyngeal -- Pharyngeal- Nectar Teaspoon -- Pharyngeal -- Pharyngeal- Nectar Cup -- Pharyngeal -- Pharyngeal- Nectar Straw WFL Pharyngeal -- Pharyngeal- Thin Teaspoon -- Pharyngeal -- Pharyngeal- Thin Cup Penetration/Aspiration before swallow Pharyngeal Material enters airway, remains ABOVE vocal cords and not ejected out Pharyngeal- Thin Straw Penetration/Aspiration before swallow Pharyngeal Material enters airway, remains ABOVE vocal cords and not ejected out Pharyngeal- Puree WFL Pharyngeal -- Pharyngeal- Mechanical Soft WFL Pharyngeal -- Pharyngeal- Regular -- Pharyngeal -- Pharyngeal- Multi-consistency -- Pharyngeal -- Pharyngeal- Pill -- Pharyngeal -- Pharyngeal Comment --  CHL IP CERVICAL ESOPHAGEAL PHASE 11/30/2019 Cervical Esophageal Phase WFL Pudding Teaspoon -- Pudding Cup -- Honey Teaspoon -- Honey Cup -- Nectar Teaspoon -- Nectar Cup -- Nectar Straw -- Thin Teaspoon -- Thin Cup -- Thin Straw -- Puree -- Mechanical Soft -- Regular -- Multi-consistency -- Pill -- Cervical Esophageal Comment -- Osie Bond., M.A. CCC-SLP Acute Rehabilitation Services Pager [phone] Office [phone] 11/30/2019, 11:56 AM              VAS [place] TRANSCRANIAL DOPPLER  Result Date: 12/13/2019  Transcranial Doppler Indications: Subarachnoid hemorrhage. Limitations: Patient position and poor patient cooperation. Bandaging. Limitations for diagnostic windows: Unable to insonate right transtemporal window. Unable to insonate occipital window. Comparison Study: 11/22/2019- TCD Performing Technologist: Maudry Mayhew MHA, RDMS, RVT, RDCS  Examination Guidelines: A complete evaluation includes B-mode imaging, spectral Doppler, color Doppler, and power Doppler as needed of all accessible portions of each vessel. Bilateral testing is considered an integral part of a complete examination.  Limited examinations for reoccurring indications may be performed as noted.  +----------+-------------+----------+-----------+------------------+ RIGHT TCD Right VM (cm)Depth (cm)Pulsatility     Comment       +----------+-------------+----------+-----------+------------------+  MCA                                         Unable to insonate +----------+-------------+----------+-----------+------------------+ ACA                                         Unable to insonate +----------+-------------+----------+-----------+------------------+ Term ICA                                    Unable to insonate +----------+-------------+----------+-----------+------------------+ PCA                                         Unable to insonate +----------+-------------+----------+-----------+------------------+ Opthalmic     22.00                 1.24                       +----------+-------------+----------+-----------+------------------+ ICA siphon    57.00                 0.52                       +----------+-------------+----------+-----------+------------------+ Vertebral                                   Unable to insonate +----------+-------------+----------+-----------+------------------+  +----------+------------+----------+-----------+------------------+ LEFT TCD  Left VM (cm)Depth (cm)Pulsatility     Comment       +----------+------------+----------+-----------+------------------+ MCA          21.00                 1.03                       +----------+------------+----------+-----------+------------------+ ACA          -35.00                1.10                       +----------+------------+----------+-----------+------------------+ Term ICA                                   Unable to insonate +----------+------------+----------+-----------+------------------+ PCA          17.00                 1.22                        +----------+------------+----------+-----------+------------------+ Opthalmic    22.00                 1.14                       +----------+------------+----------+-----------+------------------+ ICA siphon   53.00                 0.99                       +----------+------------+----------+-----------+------------------+  Vertebral                                  Unable to insonate +----------+------------+----------+-----------+------------------+  +------------+-------+------------------+             VM cm/s     Comment       +------------+-------+------------------+ Prox Basilar       Unable to insonate +------------+-------+------------------+ Summary:  Poor acoustic windows throughout limit evaluation.Low normal mean flow velocities in identified vessels of anterior circulation only.No posteriorcirculation vessels were identified. *See table(s) above for TCD measurements and observations.  Diagnosing physician: Delia HeadyPramod Sethi MD Electronically signed by Delia HeadyPramod Sethi MD on 12/13/2019 at 8:27:14 AM.    Final    VAS US TRANSCRANIAL DOPPLER  Result Date: 11/24/2019  Transcranial Doppler Indications: Subarachnoid hemorrhage. Limitations: Poor patient cooperation and position, bandages. Limitations for diagnostic windows: Unable to insonate right transtemporal window. Unable to insonate left transtemporal window. Unable to insonate occipital window. Comparison Study: 11/22/2019 TCD Performing Technologist: Gertie FeyMichelle Simonetti MHA, RDMS, RVT, RDCS  Examination Guidelines: A complete evaluation includes B-mode imaging, spectral Doppler, color Doppler, and power Doppler as needed of all accessible portions of each vessel. Bilateral testing is considered an integral part of a complete examination. Limited examinations for reoccurring indications may be performed as noted.  +----------+-------------+----------+-----------+------------------+ RIGHT TCD Right VM (cm)Depth (cm)Pulsatility      Comment       +----------+-------------+----------+-----------+------------------+ MCA                                         Unable to insonate +----------+-------------+----------+-----------+------------------+ ACA                                         Unable to insonate +----------+-------------+----------+-----------+------------------+ Term ICA                                    Unable to insonate +----------+-------------+----------+-----------+------------------+ PCA                                         Unable to insonate +----------+-------------+----------+-----------+------------------+ Opthalmic     24.00                                            +----------+-------------+----------+-----------+------------------+ ICA siphon    27.00                                            +----------+-------------+----------+-----------+------------------+ Vertebral                                   Unable to insonate +----------+-------------+----------+-----------+------------------+  +----------+------------+----------+-----------+------------------+ LEFT TCD  Left VM (cm)Depth (cm)Pulsatility     Comment       +----------+------------+----------+-----------+------------------+ MCA  Unable to insonate +----------+------------+----------+-----------+------------------+ ACA                                        Unable to insonate +----------+------------+----------+-----------+------------------+ Term ICA                                   Unable to insonate +----------+------------+----------+-----------+------------------+ PCA                                        Unable to insonate +----------+------------+----------+-----------+------------------+ Opthalmic    22.00                                            +----------+------------+----------+-----------+------------------+ ICA siphon    43.00                                            +----------+------------+----------+-----------+------------------+ Vertebral                                  Unable to insonate +----------+------------+----------+-----------+------------------+  +------------+-------+------------------+             VM cm/s     Comment       +------------+-------+------------------+ Prox Basilar       Unable to insonate +------------+-------+------------------+ Summary:  Suboptimal study due to poor bitemporal and suboccipital windows.antegrade flow in both opthalmics and carotid siphons. *See table(s) above for TCD measurements and observations.  Diagnosing physician: Delia Heady MD Electronically signed by Delia Heady MD on 11/24/2019 at 1:16:05 PM.    Final     Labs:  Basic Metabolic Panel: Recent Labs  Lab 12/18/19 0552  NA 139  K 4.1  CL 102  CO2 23  GLUCOSE 99  BUN 15  CREATININE 0.62  CALCIUM 10.2    CBC: Recent Labs  Lab 12/18/19 0552  WBC 5.9  HGB 12.2  HCT 36.6  MCV 99.5  PLT 349    CBG: Recent Labs  Lab 12/18/19 2126 12/19/19 0635 12/19/19 1152 12/19/19 2058 12/20/19 0600  GLUCAP 107* 99 91 103* 99   Family history.  Mother and father hypertension as well as hyperlipidemia.  Denies any diabetes mellitus colon cancer or esophageal cancer  Brief HPI:   Olivia Mann is a 64 y.o. right-handed female with unremarkable past medical history on no prescription medications.  Presented 11/18/2019 with seizures and headaches.  Cranial CT scan revealed subarachnoid hemorrhage.  Per report diffuse SAH 2.2 x 2.4 x 2.7 cm intraparenchymal hematoma at the right temporal tip region.  No hydrocephalus.  Mass-effect with right to left shift of 3 mm.  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  The aneurysm was 2 to 3 mm proximal to the ICA  bifurcation.  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  Patient underwent right frontotemporal craniotomy for clipping of internal carotid artery aneurysm as well as evacuation of right temporal hematoma on 12/16/2019 per Dr. Conchita ParisNundkumar.  Patient continued on Keppra for seizure prophylaxis.  Subcutaneous Lovenox for DVT prophylaxis initiated 11/22/2019.  Hospital course complicated by dysphagia with nasogastric tube for nutritional support.  Patient was admitted for a comprehensive rehab program.   Hospital Course: Julious OkaGail Peil was admitted to rehab 12/02/2019 for inpatient therapies to consist of PT, ST and OT at least three hours five days a week. Past admission physiatrist, therapy team and rehab RN have worked together to provide customized collaborative inpatient rehab.  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  Patient would follow-up neurosurgery.  Subcutaneous Lovenox had been initiated for DVT prophylaxis 11/22/2019 with no bleeding episodes.  Mood stabilization with the use of Seroquel 25 mg as needed as well as Xanax as needed.  Pain managed with oxycodone only as needed.  Patient was placed on Ritalin and titrated accordingly for attention and focus attention had continued to improve.  Her diet has been advanced to a dysphagia #2 thin liquid diet nasogastric tube was discontinued.  Megace was initially added to help stimulate appetite.  She was receiving IV fluids at bedtime that had since been discontinued.  She continued on Keppra for seizure prophylaxis no seizure activity.  Patient did have a history of tobacco abuse she was weaned from a NicoDerm patch as well as receiving counsel regards to cessation of nicotine products.   Blood pressures were monitored on TID basis and controlled   She was receiving routine  toileting  She has made gains during rehab stay and is attending therapies  She will continue to receive follow up therapies   after discharge  Rehab course: During patient's stay in rehab weekly team conferences were held to monitor patient's progress, set goals and discuss barriers to discharge. At admission, patient required moderate assist sit to stand, moderate assist sit to supine, moderate assist general transfers.  Max assist upper body bathing total assist lower body bathing max is upper body dressing total assist lower body dressing  Physical exam.  Blood pressure 134/88 pulse 90 temperature 98 respirations 18 oxygen saturation 92% room air Constitutional well-developed well-nourished HEENT.  Craniotomy site clean and dry Eyes.  Pupils round and reactive to light no discharge without nystagmus Neck.  Supple nontender no JVD without thyromegaly Cardiac regular rate rhythm without any extra sounds or murmur heard GI.  Soft nontender positive bowel sounds without rebound Respiratory effort normal no respiratory distress without wheeze Musculoskeletal.  Lower extremity +1 edema Neurological patient was somewhat lethargic right gaze preference limited awareness of deficits   /She  has had improvement in activity tolerance, balance, postural control as well as ability to compensate for deficits. Francis Dowse/She has had improvement in functional use RUE/LUE  and RLE/LLE as well as improvement in awareness.  Working with energy conservation techniques.  Family with ongoing hands-on education.  Both practice multiple stand pivot transfers in each direction to and from edge of bed with assist from therapist.  Emphasized importance of wearing shoes and left AFO.  Discussed using same technique for all other transfers including car bedside commode and shower.  Patient did have a fall on the afternoon of 12/20/2019 while with therapies no injury sustained.  It was discussed again at length the need for  assistance  for patient safety.  Supine to sit with supervision and don shoes with a left AFO.  Ambulated 25 feet x 2 with moderate assist for lateral weight shifting.  Speech therapy follow-up focus on dysphagia goals patient demonstrated efficient mastication with complete oral clearance.  Working with visual scanning sustained attention recall and overall safety.  Full family teaching completed plan discharge to home       Disposition: Discharge to home    Diet: Dysphagia #2 thin liquids  Special Instructions: No driving smoking or alcohol  Medications at discharge 1.  Tylenol as needed 2.  Xanax 1 mg every 6 hours as needed anxiety 3.  Keppra 1000 mg p.o. twice daily 4.  Ritalin 5 mg p.o. twice daily 5.  NicoDerm patch taper as directed 6.  Oxycodone 5 mg every 6 as needed moderate pain 7.  Protonix 40 mg p.o. daily 8.  Seroquel 25 mg nightly as needed agitation  Discharge Instructions    Ambulatory referral to Physical Medicine Rehab   Complete by: As directed    Moderate complexity follow up 1-2 weeks SAH      Follow-up Information    Lisbeth Renshaw, MD Follow up.   Specialty: Neurosurgery Why: Call for appointment Contact information: [address] [place] [address] [phone]        Horton Chin, MD Follow up.   Specialty: Physical Medicine and Rehabilitation Why: office to call for appointment Contact information: [address] [place] [address] [phone]           Signed: Charlton Amor 12/22/2019, 5:32 AM

## 2019-12-20 NOTE — Progress Notes (Signed)
Occupational Therapy Session Note  Patient Details  Name: Olivia Mann MRN: [id number] Date of Birth: 1956/08/11  Today's Date: 12/20/2019 OT Individual Time: 1000-1045 OT Individual Time Calculation (min): 45 min    Short Term Goals: Week 3:  OT Short Term Goal 1 (Week 3): STG= LTG d/t ELOS  Skilled Therapeutic Interventions/Progress Updates:    Family not present for scheduled OT education session. Pt declined need for any ADLs and was transported to the therapy gym via w/c. Pt transitioned into supine on mat for LUE NMR focus. Wonderful session with pt demonstrating bicep and trciep activation!! In supine with slideboard held overhead and facilitation for BUE to weightbear overhead in extension, pt able to demonstrate ~10 degrees of voluntary elbow flexion and extension. Mod cueing throughout session for visual attention to L UE. Pt transitioned into sideling on L side for maximal visual attention to L. Pt completed bicep/tricep focused activation with AAROM. Pt often wanting to use RUE to assist, edu provided re importance of allowing small degrees of movement on recovery. Pt with most movement at mid-range and able to voluntary activate with some level of gravity/weight eliminated. Pt returned to her room and was left sitting up with SLP.   Unattended e-stim: No adverse skin reaction and no pain reported.  [place]o Stim One 330 pulse width 35 Hz pulse rate On 8 sec/ off 8 sec Ramp up/ down 2 sec Symmetrical Biphasic wave form  Max intensity at 500 Ohm load   Therapy Documentation Precautions:  Precautions Precautions: Fall Precaution Comments: L neglect, L hemiparesis Restrictions Weight Bearing Restrictions: No   Therapy/Group: Individual Therapy  [name] 12/20/2019, 6:44 AM

## 2019-12-20 NOTE — Progress Notes (Signed)
[place] PHYSICAL MEDICINE & REHABILITATION PROGRESS NOTE  Subjective/Complaints: Up in chair. No complaints. Asks me to help her call her friend on her cellphone.  Denies pain or constipation.   ROS: Limited due to cognitive/behavioral   Objective: Vital Signs: Blood pressure (!) 144/99, pulse 85, temperature 98.6 F (37 C), resp. rate 18, height 5\' 1"  (1.549 m), weight 54.4 kg, SpO2 100 %. No results found. Recent Labs    12/18/19 0552  WBC 5.9  HGB 12.2  HCT 36.6  PLT 349   Recent Labs    12/18/19 0552  NA 139  K 4.1  CL 102  CO2 23  GLUCOSE 99  BUN 15  CREATININE 0.62  CALCIUM 10.2    Physical Exam: BP (!) 144/99 (BP Location: Right Arm)   Pulse 85   Temp 98.6 F (37 C)   Resp 18   Ht 5\' 1"  (1.549 m)   Wt 54.4 kg   SpO2 100%   BMI 22.67 kg/m  Constitutional: No distress . Vital signs reviewed. Sitting up in chair.  HEENT: EOMI, oral membranes moist Neck: supple Cardiovascular: RRR without murmur. No JVD    Respiratory: CTA Bilaterally without wheezes or rales. Normal effort    GI: BS +, non-tender, non-distended  Skin: scalp incision CDI Psych: alert, some delusional thoughts. STM deficits.  Musc: No edema in extremities.  No tenderness in extremities. Neurological: oriented to person, place. Follows simple commands. Normal language.  Decreased pain sense left arm and leg. LUE 0/5 except for limited shoulder movement. LLE HF and KE 1+ to 2/5. 0/5 ADF/PF.  Left inattention.   Assessment/Plan: 1. Functional deficits secondary to subarachnoid hemorrhage/hematoma status post right frontotemporal craniotomy which require 3+ hours per day of interdisciplinary therapy in a comprehensive inpatient rehab setting.  Physiatrist is providing close team supervision and 24 hour management of active medical problems listed below.  Physiatrist and rehab team continue to assess barriers to discharge/monitor patient progress toward functional and medical  goals  Care Tool:  Bathing    Body parts bathed by patient: Chest, Abdomen, Front perineal area, Right upper leg, Left upper leg, Face, Left arm, Left lower leg, Right lower leg, Buttocks, Right arm   Body parts bathed by helper: Buttocks, Right arm     Bathing assist Assist Level: Minimal Assistance - Patient > 75%     Upper Body Dressing/Undressing Upper body dressing   What is the patient wearing?: Pull over shirt    Upper body assist Assist Level: Moderate Assistance - Patient 50 - 74%    Lower Body Dressing/Undressing Lower body dressing      What is the patient wearing?: Incontinence brief, Pants     Lower body assist Assist for lower body dressing: Moderate Assistance - Patient 50 - 74%     Toileting Toileting    Toileting assist Assist for toileting: Moderate Assistance - Patient 50 - 74%     Transfers Chair/bed transfer  Transfers assist     Chair/bed transfer assist level: Minimal Assistance - Patient > 75%     Locomotion Ambulation   Ambulation assist      Assist level: Maximal Assistance - Patient 25 - 49% Assistive device: Walker-rolling Max distance: 15'   Walk 10 feet activity   Assist     Assist level: Maximal Assistance - Patient 25 - 49% Assistive device: Walker-rolling   Walk 50 feet activity   Assist Walk 50 feet with 2 turns activity did not occur: Safety/medical concerns  Walk 150 feet activity   Assist Walk 150 feet activity did not occur: Safety/medical concerns         Walk 10 feet on uneven surface  activity   Assist Walk 10 feet on uneven surfaces activity did not occur: Safety/medical concerns         Wheelchair     Assist Will patient use wheelchair at discharge?: (TBD) Type of Wheelchair: Manual    Wheelchair assist level: Minimal Assistance - Patient > 75% Max wheelchair distance: 150'    Wheelchair 50 feet with 2 turns activity    Assist        Assist Level:  Minimal Assistance - Patient > 75%   Wheelchair 150 feet activity     Assist     Assist Level: Minimal Assistance - Patient > 75%      1.  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  Status post right frontotemporal craniotomy for clipping of internal carotid artery aneurysm as well as evacuation of right temporal hematoma 11/19/2019  Continue CIR PT, OT, SLP   -  AFO LLE for gait, PRAFO/WHO at rest  -plan is for family ed and home on Friday    -2/3: I have personally reviewed OT note today--patient did excellent job with biceps and triceps activation! Family was not present for education session.  2.  Antithrombotics: -DVT/anticoagulation: Subcutaneous Lovenox initiated 11/22/2019.  no bleeding             -antiplatelet therapy: N/A 3. Pain Management: Oxycodone as needed  1/31-2/3: pain well controlled  4. Mood: Xanax 1 mg every 6 hours as needed---limit as possible             -antipsychotic agents: prn seroquel 5. Neuropsych: This patient is not capable of making decisions on her own behalf.  -1/29 continue ritalin trial for attention and focus, attention improving  1/30-2/1 continues to have visual/auditory hallucinations, delusions   -continue to orient patient, neuro-psych follow up   -would like to avoid scheduled meds to control mood if possible 6. Skin/Wound Care: Routine skin checks 7. Fluids/Electrolytes/Nutrition: Routine in and outs.    -NG feeds decreased at night to help with appetite   -protein for low albumin  -changed IVF to HS only to help with therapies  -continue NGT for now as she just isn't eating consistently enough  1/23 -added megace    1/24- eating little  1/25: states that she doesn't eat much at baseline, daughter corroborates. Would like NGT removed; will discuss with SLP.  2/1 diet upgraded to D2, continue to encourage PO 8. Seizure prophylaxis.   Continue Keppra 1000 mg twice daily 9.  Post stroke dysphagia:     -continue Dysphagia #1 thins liquids.   -advance per speech therapy  -on PO alone currently 10. Hypertension.  Completed course of Nimotop.                2/2 DBP with elevation---no changes to plan at present. Likely up when she becomes agitated  2/3: labile, well controlled for the most part.  11. Hypokalemia  I personally reviewed the patient's labs today.    K+ 4.1 2/1 12. Increased LFT's. Likely reactive  -resolved, as of 2/1 LFT's normal.    14. Constipation: no bm since 1/29.    2/2 Sorbitol today     -senna-s 2 tabs at hs      LOS: 18 days A FACE TO FACE EVALUATION WAS PERFORMED  Arley Salamone P Bernie Ransford 12/20/2019, 12:40 PM

## 2019-12-20 NOTE — Progress Notes (Signed)
Patient daughter Olivia Mann updated on patient fall at appx noon today. Patient stated she "went to grab her comb because I thought I could do it myself but then I lost my balance and sat on the floor," MD notified as well, no injuries noted.

## 2019-12-20 NOTE — Progress Notes (Signed)
Speech Language Pathology Daily Session Note  Patient Details  Name: Fransisca Shawn MRN: [id number] Date of Birth: 1956-03-11  Today's Date: 12/20/2019 SLP Individual Time: 7001-7494 SLP Individual Time Calculation (min): 40 min  Short Term Goals: Week 3: SLP Short Term Goal 1 (Week 3): Pt will sustain her attention to basic, familiar tasks for 30 minutes with mod verbal cues for redirection. SLP Short Term Goal 2 (Week 3): Pt will complete basic, familiar tasks wtih min assist for functional problem solving SLP Short Term Goal 3 (Week 3): Pt will locate items at midline during functional tasks with Mod assist verbal and visual cues in 75% of opportunities. SLP Short Term Goal 4 (Week 3): Pt will consume current diet with minimal overt s/s of aspiration with Min A verbal cues for use of swallowing compensatory strategies. SLP Short Term Goal 5 (Week 3): Pt will consume therapeutic trials of dys 3 textures with min cues for use of swallowing precautions and minimal overt s/s of aspiration over 3 consecutive sessions prior to advancement.  Skilled Therapeutic Interventions: Skilled treatment session focused on dysphagia goals and family education with the patient's daughters. SLP facilitated session by providing trials of dys. 3 textures. Patient demonstrated efficient mastication with complete oral clearance, therefore, recommend trial tray prior to upgrade. SLP also facilitated session by providing education to the patient and her daughters in regards to her current cognitive functioning and strategies to utilize at home to maximize visual scanning, sustained attention, recall and overall safety. All verbalized understanding and handouts were given to reinforce information. Patient left upright in wheelchair with daughters present. Continue with current plan of care.      Pain Pain Assessment Pain Scale: 0-10 Pain Score: 0-No pain  Therapy/Group: Individual Therapy  Rea Reser 12/20/2019,  2:36 PM

## 2019-12-20 NOTE — Progress Notes (Signed)
  Patient ID: Olivia Mann, female   DOB: 1956-01-21, 64 y.o.   MRN: [id number]   Diagnosis code:  I60.9  Height:  5'1"  Weight:  126 lbs  Patient suffered a subarachnoid hemorrhage which impairs her ability to perform daily activities like bathing, dressing and toileting in the home.  The patient is confined to one level of the home where there is no toilet access.  She requires a bedside commode to allow safe toilet transfers and hygeine care.  Mariam Dollar, PA-C

## 2019-12-21 ENCOUNTER — Inpatient Hospital Stay (HOSPITAL_COMMUNITY): Payer: Self-pay | Admitting: Physical Therapy

## 2019-12-21 ENCOUNTER — Encounter (HOSPITAL_COMMUNITY): Payer: Self-pay

## 2019-12-21 ENCOUNTER — Inpatient Hospital Stay (HOSPITAL_COMMUNITY): Payer: Self-pay | Admitting: Speech Pathology

## 2019-12-21 NOTE — Progress Notes (Signed)
Physical Therapy Discharge Summary  Patient Details  Name: Olivia Mann MRN: [id number] Date of Birth: 11/01/1956  Today's Date: 12/21/2019 PT Individual Time: 6568-1275 AND 1215-1230 PT Individual Time Calculation (min): 8 min AND 15 min  Session 1:  Pt in w/c and requesting to get back to bed. Reminded her that it was time for therapy and she continued to insist on tacking a nap 2/2 fatigue. Pt perseverating on nursing telling her she had called 911, states she did not and is very upset by this. Stand pivot to EOB w/ CGA. Ended session in supine, all needs in reach.  Session 2:  Pt in w/c, daughter (TIA) present and both agreeable to therapy, no c/o pain. Educated daughter on LAFO use w/ pt's new shoes and L toe cap. Ambulated 3 steps forward and 3 steps backwards w/ RW and LUE orthosis, pt's shoes and toe cap both working well and allowing for increased independence w/ LLE management. Reinforced to both daughter and pt that pt is not safe to ambulate w/ family or caregivers at home. RW is only for static standing, both verbalized understanding and in agreement, provided daughter handout w/ w/c bumping up stairs and curb for home access. Daughter reports this will be no problem for herself and other family members are able assist. Daughter w/ no further questions and feels ready to take her mom home. Ended session in w/c and all needs in reach.   Patient has met 9 of 9 long term goals due to improved activity tolerance, improved balance, improved postural control, increased strength, ability to compensate for deficits, functional use of  left lower extremity, improved attention, improved awareness and improved coordination.  Patient to discharge at a wheelchair level Supervision.   Patient's care partner is independent to provide the necessary physical and cognitive assistance at discharge. See note above and progress note from 12/20/2019 session regarding family/caregiver edu.   Reasons goals not met:  n/a  Recommendation:  Patient will benefit from ongoing skilled PT services in home health setting to continue to advance safe functional mobility, address ongoing impairments in LLE NMR, L attention and body/environmental awareness, safety awareness, postural control and standing balance, and endurance, and minimize fall risk.  Equipment: w/c and RW   Reasons for discharge: treatment goals met and discharge from hospital  Patient/family agrees with progress made and goals achieved: Yes  PT Discharge Precautions/Restrictions Precautions Precautions: Fall Precaution Comments: L neglect, L hemiparesis Restrictions Weight Bearing Restrictions: No Vital Signs Therapy Vitals Temp: 98.4 F (36.9 C) Pulse Rate: 82 Resp: 16 BP: 125/90 Patient Position (if appropriate): Lying Oxygen Therapy SpO2: 100 % O2 Device: Room Air Vision/Perception  Vision - Assessment Ocular Range of Motion: Restricted on the left Alignment/Gaze Preference: Gaze right Tracking/Visual Pursuits: Requires cues, head turns, or add eye shifts to track Perception Perception: Impaired Inattention/Neglect: Does not attend to left visual field;Does not attend to left side of body(although improved since eval) Praxis Praxis: Impaired Praxis Impairment Details: Initiation;Motor planning;Perseveration;Ideomotor  Cognition Overall Cognitive Status: Impaired/Different from baseline Arousal/Alertness: Awake/alert Orientation Level: Oriented to person;Oriented to place Attention: Sustained Focused Attention: Appears intact Sustained Attention: Impaired Sustained Attention Impairment: Verbal basic;Functional basic Memory: Impaired Memory Impairment: Decreased short term memory Decreased Short Term Memory: Functional complex Awareness: Impaired Awareness Impairment: Emergent impairment Problem Solving: Impaired Problem Solving Impairment: Functional basic;Verbal basic Safety/Judgment:  Impaired Sensation Sensation Light Touch: Impaired by gross assessment Coordination Gross Motor Movements are Fluid and Coordinated: No Fine Motor Movements are Fluid and  Coordinated: No Coordination and Movement Description: gross motor movements impaired 2/2 L hemi, UE>LE Motor  Motor Motor: Hemiplegia;Abnormal postural alignment and control Motor - Discharge Observations: L hemi remains, UE>LE, 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)  Mobility Bed Mobility Bed Mobility: Rolling Right;Rolling Left;Sit to Supine;Supine to Sit Rolling Right: Supervision/verbal cueing Rolling Left: Supervision/Verbal cueing Supine to Sit: Supervision/Verbal cueing Sit to Supine: Supervision/Verbal cueing Transfers Transfers: Sit to Stand;Stand to Sit;Stand Pivot Transfers Sit to Stand: Contact Guard/Touching assist Stand to Sit: Contact Guard/Touching assist Stand Pivot Transfers: Contact Guard/Touching assist Stand Pivot Transfer Details: Tactile cues for initiation;Tactile cues for posture;Tactile cues for sequencing;Verbal cues for precautions/safety;Verbal cues for technique Transfer (Assistive device): None Locomotion  Gait Ambulation: Yes Gait Assistance: Moderate Assistance - Patient 50-74% Gait Distance (Feet): 25 Feet Assistive device: Rolling walker Gait Assistance Details: Tactile cues for sequencing;Tactile cues for initiation;Manual facilitation for weight shifting;Verbal cues for gait pattern;Verbal cues for technique;Tactile cues for posture Gait Gait: Yes Gait Pattern: Impaired Gait Pattern: Narrow base of support;Left flexed knee in stance;Decreased dorsiflexion - left;Decreased weight shift to right;Step-to pattern Stairs / Additional Locomotion Stairs: No Wheelchair Mobility Wheelchair Mobility: Yes Wheelchair Assistance: Chartered loss adjuster: Right upper extremity;Right lower extremity Wheelchair Parts Management: Needs  assistance Distance: 150'  Trunk/Postural Assessment  Cervical Assessment Cervical Assessment: Within Functional Limits Thoracic Assessment Thoracic Assessment: Within Functional Limits Lumbar Assessment Lumbar Assessment: Within Functional Limits Postural Control Postural Control: Deficits on evaluation Postural Limitations: Impaired midline orientation and attention to L, mild L pusher's syndrome (all much improved since evaluation)  Balance Balance Balance Assessed: Yes Static Sitting Balance Static Sitting - Balance Support: Feet supported;No upper extremity supported Static Sitting - Level of Assistance: 5: Stand by assistance Dynamic Sitting Balance Dynamic Sitting - Level of Assistance: 5: Stand by assistance Static Standing Balance Static Standing - Balance Support: Right upper extremity supported Static Standing - Level of Assistance: 4: Min assist Dynamic Standing Balance Dynamic Standing - Balance Support: During functional activity;Right upper extremity supported Dynamic Standing - Level of Assistance: 4: Min assist Extremity Assessment  RLE Assessment RLE Assessment: Exceptions to [facility] Passive Range of Motion (PROM) Comments: [facility] RLE Strength Right Hip Flexion: 2+/5 Right Hip Extension: 3-/5 Right Hip ABduction: 1/5 Right Hip ADduction: 1/5 Right Knee Flexion: 1/5 Right Knee Extension: 2+/5 Right Ankle Dorsiflexion: 0/5 Right Ankle Plantar Flexion: 0/5 LLE Assessment LLE Assessment: Within Functional Limits    Willow Reczek K Aarthi Uyeno 12/21/2019, 9:24 AM

## 2019-12-21 NOTE — Progress Notes (Signed)
[place] PHYSICAL MEDICINE & REHABILITATION PROGRESS NOTE  Subjective/Complaints: Upset that she had concerns about husband last night. ?called 911. Anxious to get home. Fell yesterday. No injuries  ROS: Patient denies fever, rash, sore throat, blurred vision, nausea, vomiting, diarrhea, cough, shortness of breath or chest pain, joint or back pain, headache,      Objective: Vital Signs: Blood pressure 125/90, pulse 82, temperature 98.4 F (36.9 C), resp. rate 16, height 5\' 1"  (1.549 m), weight 55.3 kg, SpO2 100 %. No results found. No results for input(s): WBC, HGB, HCT, PLT in the last 72 hours. No results for input(s): NA, K, CL, CO2, GLUCOSE, BUN, CREATININE, CALCIUM in the last 72 hours.  Physical Exam: BP 125/90 (BP Location: Right Arm)   Pulse 82   Temp 98.4 F (36.9 C)   Resp 16   Ht 5\' 1"  (1.549 m)   Wt 55.3 kg   SpO2 100%   BMI 23.05 kg/m  Constitutional: No distress . Vital signs reviewed. HEENT: EOMI, oral membranes moist Neck: supple Cardiovascular: RRR without murmur. No JVD    Respiratory: CTA Bilaterally without wheezes or rales. Normal effort    GI: BS +, non-tender, non-distended  Skin: scalp incision CDI Psych: alert, intremittent delusional thoughts. STM deficits.  Musc: No edema in extremities.  No tenderness in extremities. Neurological: oriented to person, place. Follows simple commands. Normal language.  Decreased pain sense left arm and leg. LUE 0/5 except for limited shoulder movement and perhaps some biceps today. LLE HF and KE 3/5, tr -1 ADF/PF  Left inattention.   Assessment/Plan: 1. Functional deficits secondary to subarachnoid hemorrhage/hematoma status post right frontotemporal craniotomy which require 3+ hours per day of interdisciplinary therapy in a comprehensive inpatient rehab setting.  Physiatrist is providing close team supervision and 24 hour management of active medical problems listed below.  Physiatrist and rehab team continue to  assess barriers to discharge/monitor patient progress toward functional and medical goals  Care Tool:  Bathing    Body parts bathed by patient: Chest, Abdomen, Front perineal area, Right upper leg, Left upper leg, Face, Left arm, Left lower leg, Right lower leg, Buttocks, Right arm   Body parts bathed by helper: Buttocks, Right arm     Bathing assist Assist Level: Minimal Assistance - Patient > 75%     Upper Body Dressing/Undressing Upper body dressing   What is the patient wearing?: Pull over shirt    Upper body assist Assist Level: Moderate Assistance - Patient 50 - 74%    Lower Body Dressing/Undressing Lower body dressing      What is the patient wearing?: Incontinence brief, Pants     Lower body assist Assist for lower body dressing: Moderate Assistance - Patient 50 - 74%     Toileting Toileting    Toileting assist Assist for toileting: Moderate Assistance - Patient 50 - 74%     Transfers Chair/bed transfer  Transfers assist     Chair/bed transfer assist level: Minimal Assistance - Patient > 75%     Locomotion Ambulation   Ambulation assist      Assist level: Moderate Assistance - Patient 50 - 74% Assistive device: Walker-rolling Max distance: 25'   Walk 10 feet activity   Assist     Assist level: Moderate Assistance - Patient - 50 - 74% Assistive device: Walker-rolling   Walk 50 feet activity   Assist Walk 50 feet with 2 turns activity did not occur: Safety/medical concerns  Walk 150 feet activity   Assist Walk 150 feet activity did not occur: Safety/medical concerns         Walk 10 feet on uneven surface  activity   Assist Walk 10 feet on uneven surfaces activity did not occur: Safety/medical concerns         Wheelchair     Assist Will patient use wheelchair at discharge?: Yes Type of Wheelchair: Manual    Wheelchair assist level: Supervision/Verbal cueing Max wheelchair distance: 150'     Wheelchair 50 feet with 2 turns activity    Assist        Assist Level: Supervision/Verbal cueing   Wheelchair 150 feet activity     Assist     Assist Level: Supervision/Verbal cueing      1.  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  Status post right frontotemporal craniotomy for clipping of internal carotid artery aneurysm as well as evacuation of right temporal hematoma 11/19/2019  Continue CIR PT, OT, SLP   -  AFO LLE for gait, PRAFO/WHO at rest  -plan is for family ed and home on Friday---  -Patient to see Dr. Ranell Patrick in the office for transitional care encounter in 1-2 weeks.   2.  Antithrombotics: -DVT/anticoagulation: Subcutaneous Lovenox initiated 11/22/2019.  no bleeding             -antiplatelet therapy: N/A 3. Pain Management: Oxycodone as needed  1/31-2/3: pain well controlled  4. Mood: Xanax 1 mg every 6 hours as needed---limit as possible             -antipsychotic agents: prn seroquel 5. Neuropsych: This patient is not capable of making decisions on her own behalf.  -1/29 continue ritalin trial for attention and focus, attention improving  2/4 intermittent hallucinations, delusions.  6. Skin/Wound Care: Routine skin checks 7. Fluids/Electrolytes/Nutrition: Routine in and outs.    -NG feeds decreased at night to help with appetite   -protein for low albumin  -changed IVF to HS only to help with therapies  -continue NGT for now as she just isn't eating consistently enough  1/23 -added megace    1/24- eating little  1/25: states that she doesn't eat much at baseline, daughter corroborates. Would like NGT removed; will discuss with SLP.  2/1 diet upgraded to D2, continue to encourage PO 8. Seizure prophylaxis.   Continue Keppra 1000 mg twice daily 9.  Post stroke dysphagia:    -continue Dysphagia #1 thins liquids.   -advance per speech therapy  -on PO alone currently 10. Hypertension.  Completed course of Nimotop.                 2/2 DBP with elevation---no changes to plan at present. Likely up when she becomes agitated  2/4: fair control. DBP up at times  11. Hypokalemia       K+ 4.1 2/1 12. Increased LFT's. Likely reactive  -resolved, as of 2/1 LFT's normal.    14. Constipation: no bm since 1/29.    2/2 bm     -senna-s 2 tabs at hs      LOS: 19 days A FACE TO [place] 12/21/2019, 10:40 AM

## 2019-12-21 NOTE — Progress Notes (Signed)
Speech Language Pathology Discharge Summary  Patient Details  Name: Olivia Mann MRN: [id number] Date of Birth: June 28, 1956  Today's Date: 12/21/2019 SLP Individual Time: 1100-1155 SLP Individual Time Calculation (min): 55 min   Skilled Therapeutic Interventions:  Skilled treatment session focused on cognitive goals. SLP facilitated session by providing extra time and Min verbal and tactile cues for arousal. Patient was transferred to the wheelchair with Min A and was perseverative on a phone call she was told she made this morning to 911 (?). SLP provided Mod verbal cues for redirection. SLP administered the Cognistat in which patient demonstrated mild impairments in attention, short-term memory, calculations, reasoning and executive functioning. Patient requested to brush her teeth at the sink and required extra time and Min verbal cues for problem solving. Patient left upright in wheelchair with alarm on and all needs within reach. Continue with current plan of care.   Patient has met 6 of 7 long term goals.  Patient to discharge at overall Mod level.   Reasons goals not met: Patient continues to require overall Max A multimodal cues for emergent awareness of deficits.   Clinical Impression/Discharge Summary: Patient has made functional gains and has met 6 of 7 LTGs this admission. Currently, patient is consuming Dys. 2 textures with thin liquids with minimal overt s/s of aspiration and supervision level verbal cues for use of swallowing compensatory strategies. Patient requires overall Min-Mod A multimodal cues for functional problem solving and sustained attention and Max A multimodal cues for emergent awareness of deficits and overall safety awareness which is further impacted by impulsivity and left inattention. Patient and family education is complete and patient will discharge home with 24 hour supervision. Patient would benefit from f/u SLP services to maximize her cognitive and swallowing  function and overall functional independence in order to reduce caregiver burden.   Care Partner:  Caregiver Able to Provide Assistance: Yes  Type of Caregiver Assistance: Physical;Cognitive  Recommendation:  Home Health SLP;24 hour supervision/assistance  Rationale for SLP Follow Up: Maximize cognitive function and independence;Maximize swallowing safety;Reduce caregiver burden   Equipment: N/A   Reasons for discharge: Discharged from hospital   Patient/Family Agrees with Progress Made and Goals Achieved: Yes    [place], [place] 12/21/2019, 6:41 AM

## 2019-12-21 NOTE — Progress Notes (Signed)
Occupational Therapy Discharge Summary  Patient Details  Name: Olivia Mann MRN: [id number] Date of Birth: 1956-09-11  Today's Date: 12/21/2019 OT Individual Time: 1300-1400 OT Individual Time Calculation (min): 60 min    Family present for family education this date to review bathing/dressing using hemi techniques and review of transfers for hands on training. Educated family on body machanics, foot and arm placement for transfers, L inattention, hemi dressing techniques, wearing non skid footwear to transfer into shower AE to don L shoe/AFO (shoe funnel). Daughter completes hands on transfer to TTB in walk in shower to simulate home environment. Daughter able to provide physical A with bathing and dressing with VC for brake management and hand placement at hips for steadying pt during clothing managmnet. Pt able to demo hemi dressing techniques however daughter requires VC for not providing total A and give pt time to problem solve/manage L limbs. Daughter asks appropriate questions and all answered to fullest extent. Pt returned to bed and saebo stim one places on L deltoid for 60 min unattended. Skin in tact at end of session.  Saebo Stim One 330 pulse width 35 Hz pulse rate On 8 sec/ off 8 sec Ramp up/ down 2 sec Symmetrical Biphasic wave form  Max intensity 138m at 500 Ohm load  Exited session with pt seated in bed, exit alarm on, safety pad on floor and call light in reach.    Patient has met 10 of 10 long term goals due to improved activity tolerance, improved balance, postural control, ability to compensate for deficits, functional use of  LEFT upper and LEFT lower extremity, improved attention, improved awareness and improved coordination.  [facility]  Patient's care partner is independent to provide the necessary physical and cognitive assistance at discharge.    Reasons goals not met: n/a  Recommendation:  Patient will benefit from ongoing  skilled OT services in home health setting to continue to advance functional skills in the area of BADL and Reduce care partner burden.  Equipment: TTB  Reasons for discharge: treatment goals met  Patient/family agrees with progress made and goals achieved: Yes  OT Discharge Precautions/Restrictions    General PT Missed Treatment Reason: Patient fatigue;Patient unwilling to participate Vital Signs   Pain   ADL ADL Eating: Not assessed Grooming: Maximal assistance Where Assessed-Grooming: Edge of bed Upper Body Bathing: Moderate assistance Where Assessed-Upper Body Bathing: Edge of bed Lower Body Bathing: Maximal assistance Where Assessed-Lower Body Bathing: Edge of bed Upper Body Dressing: Maximal assistance Where Assessed-Upper Body Dressing: Edge of bed Lower Body Dressing: Maximal assistance Where Assessed-Lower Body Dressing: Edge of bed Toileting: Not assessed Toilet Transfer: Maximal assistance Toilet Transfer Method: SArts development officer BRadiographer, therapeutic Not assessed Vision Baseline Vision/History: No visual deficits Patient Visual Report: No change from baseline Vision Assessment?: Yes Ocular Range of Motion: Restricted on the left Alignment/Gaze Preference: Gaze right Tracking/Visual Pursuits: Requires cues, head turns, or add eye shifts to track Perception  Perception: Impaired Inattention/Neglect: Does not attend to left visual field;Does not attend to left side of body(although improved since eval) Praxis Praxis: Impaired Praxis Impairment Details: Initiation;Motor planning;Perseveration;Ideomotor Cognition Arousal/Alertness: Awake/alert Orientation Level: Oriented to person;Oriented to place Attention: Sustained Focused Attention: Appears intact Focused Attention Impairment: Functional complex Sustained Attention: Impaired Sustained Attention Impairment: Verbal basic;Functional basic Memory: Impaired Memory  Impairment: Decreased short term memory Awareness: Impaired Awareness Impairment: Emergent impairment Problem Solving: Impaired Safety/Judgment: Impaired Comments: left inattention Sensation Sensation Light  Touch: Impaired by gross assessment Coordination Gross Motor Movements are Fluid and Coordinated: No Fine Motor Movements are Fluid and Coordinated: No Coordination and Movement Description: gross motor movements impaired 2/2 L hemi, UE>LE Motor  Motor Motor: Hemiplegia;Abnormal postural alignment and control Motor - Discharge Observations: L hemi remains, UE>LE, 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) Mobility  Bed Mobility Rolling Right: Supervision/verbal cueing Rolling Left: Supervision/Verbal cueing Supine to Sit: Supervision/Verbal cueing Sit to Supine: Supervision/Verbal cueing Transfers Sit to Stand: Contact Guard/Touching assist Stand to Sit: Contact Guard/Touching assist  Trunk/Postural Assessment  Cervical Assessment Cervical Assessment: Within Functional Limits Thoracic Assessment Thoracic Assessment: Within Functional Limits Lumbar Assessment Lumbar Assessment: Within Functional Limits Postural Control Postural Control: Deficits on evaluation Postural Limitations: Impaired midline orientation and attention to L, mild L pusher's syndrome (all much improved since evaluation)  Balance Static Sitting Balance Static Sitting - Balance Support: Feet supported;No upper extremity supported Static Sitting - Level of Assistance: 5: Stand by assistance Dynamic Sitting Balance Dynamic Sitting - Balance Support: During functional activity;Right upper extremity supported Dynamic Sitting - Level of Assistance: 5: Stand by assistance Static Standing Balance Static Standing - Balance Support: Right upper extremity supported Static Standing - Level of Assistance: 5: Stand by assistance Dynamic Standing Balance Dynamic Standing - Balance Support: During  functional activity;Right upper extremity supported Dynamic Standing - Level of Assistance: 4: Min assist Extremity/Trunk Assessment RUE Assessment RUE Assessment: Within Functional Limits LUE Assessment LUE Assessment: Exceptions to [facility] General Strength Comments: flaccid wrist and digits, voluntary elbow ext/flex and scap elevation   Tonny Branch 12/21/2019, 12:51 PM

## 2019-12-22 MED ORDER — ALPRAZOLAM 1 MG PO TABS: 1.0000 mg | ORAL_TABLET | Freq: Four times a day (QID) | ORAL | 0 refills | Status: DC | PRN

## 2019-12-22 MED ORDER — NICOTINE 14 MG/24HR TD PT24: MEDICATED_PATCH | TRANSDERMAL | 0 refills | Status: AC

## 2019-12-22 MED ORDER — SENNOSIDES-DOCUSATE SODIUM 8.6-50 MG PO TABS: 2.0000 | ORAL_TABLET | Freq: Every day | ORAL | Status: AC

## 2019-12-22 MED ORDER — PANTOPRAZOLE SODIUM 40 MG PO TBEC: 40.0000 mg | DELAYED_RELEASE_TABLET | Freq: Every day | ORAL | 0 refills | Status: AC

## 2019-12-22 MED ORDER — OXYCODONE HCL 5 MG PO TABS: 5.0000 mg | ORAL_TABLET | Freq: Four times a day (QID) | ORAL | 0 refills | Status: AC | PRN

## 2019-12-22 MED ORDER — METHYLPHENIDATE HCL 5 MG PO TABS: 5.0000 mg | ORAL_TABLET | Freq: Two times a day (BID) | ORAL | 0 refills | Status: AC

## 2019-12-22 MED ORDER — LEVETIRACETAM 1000 MG PO TABS: 1000.0000 mg | ORAL_TABLET | Freq: Two times a day (BID) | ORAL | 1 refills | Status: AC

## 2019-12-22 MED ORDER — ACETAMINOPHEN 325 MG PO TABS: 650.0000 mg | ORAL_TABLET | ORAL | Status: AC | PRN

## 2019-12-22 MED ORDER — QUETIAPINE FUMARATE 25 MG PO TABS: 25.0000 mg | ORAL_TABLET | Freq: Every day | ORAL | 0 refills | Status: AC | PRN

## 2019-12-22 NOTE — Discharge Instructions (Signed)
Inpatient Rehab Discharge Instructions  Olivia Mann Discharge date and time: No discharge date for patient encounter.   Activities/Precautions/ Functional Status: Activity: activity as tolerated Diet: dysphagia # 2 thin liquids Wound Care: none needed Functional status:  ___ No restrictions     ___ Walk up steps independently ___ 24/7 supervision/assistance   ___ Walk up steps with assistance ___ Intermittent supervision/assistance  ___ Bathe/dress independently ___ Walk with walker     _x__ Bathe/dress with assistance ___ Walk Independently    ___ Shower independently ___ Walk with assistance    ___ Shower with assistance ___ No alcohol     ___ Return to work/school ________  COMMUNITY REFERRALS UPON DISCHARGE:    Home Health:   PT     OT                      Agency:  [facility] Phone: [phone]    Medical Equipment/Items Ordered:  Wheelchair, hospital bed, commode and tub bench                                                      Agency/Supplier:  [facility] [employer] @ [phone]       Special Instructions: No driving smoking or alcohol   My questions have been answered and I understand these instructions. I will adhere to these goals and the provided educational materials after my discharge from the hospital.  Patient/Caregiver Signature _______________________________ Date __________  Clinician Signature _______________________________________ Date __________  Please bring this form and your medication list with you to all your follow-up doctor's appointments.

## 2019-12-22 NOTE — Progress Notes (Signed)
Pt discharged home with family. discharge instructions given by Jesusita Oka, [place]. Belongings sent with pt. Denies pain or discomfort. Pt stable at time of discharge.  Marylu Lund, RN

## 2019-12-22 NOTE — Progress Notes (Signed)
Social Work Discharge Note   The overall goal for the admission was met for:   Discharge location: Yes - pt returning home to [place] with daughter providing 24/7 assistance  Length of Stay: Yes* originally targeted d/c date set for 2/10, however, pt pushing for earlier d/c.  Tx team discussed earlier d/c would increase level of care needed.  Pt and family agreeable and d/c date changed to 2/5.  Discharge activity level: Yes - revised goals - min assistanc  Home/community participation: Yes  Services provided included: MD, RD, PT, OT, SLP, RN, TR, Pharmacy and SW  Financial Services: Medicaid ([place])  Follow-up services arranged: Home Health: PT, OT via [facility]);  *ST was recommended, however, the only [facility] agency able to accept insurance does not have ST available and was to direct OT to address cognition.  Pt/ daughter aware., DME: 16x18 lightweight w/c, cushion, hospital bed, 3n1 commode and tub bench (family already has rolling walker) via [facility] and Patient/Family has no preference for HH/DME agencies  Comments (or additional information):  Contact person - daughter, Richarda Osmond @ [phone]  Patient/Family verbalized understanding of follow-up arrangements: Yes  Individual responsible for coordination of the follow-up plan: daughter  Confirmed correct DME delivered: Lennart Pall 12/22/2019    Maddisyn Hegwood, [place]

## 2019-12-22 NOTE — Progress Notes (Signed)
  Patient ID: Olivia Mann, female   DOB: October 20, 1956, 63 y.o.   MRN: [id number]   Diagnosis code:  I60.9; I69.091  Height:  5'1"  Weight:  126 lbs   Patient has left hemiplegia following a SAH which requires upper extremities to be positioned in ways not feasible with a normal bed.  This patient requires frequent and immediate changes in body position which cannot be achieved with a normal bed to manage pain and edema.  She also needs height adjustment to allow for safe bed to w/c transfers.  Mariam Dollar, PA-C

## 2019-12-22 NOTE — Plan of Care (Signed)
  Problem: Consults Goal: RH GENERAL PATIENT EDUCATION Description: See Patient Education module for education specifics. Outcome: Completed/Met Goal: Skin Care Protocol Initiated - if Braden Score 18 or less Description: If consults are not indicated, leave blank or document N/A Outcome: Completed/Met Goal: Nutrition Consult-if indicated Outcome: Completed/Met   Problem: RH BOWEL ELIMINATION Goal: RH STG MANAGE BOWEL WITH ASSISTANCE Description: STG Manage Bowel with min Assistance. Outcome: Completed/Met   Problem: RH BLADDER ELIMINATION Goal: RH STG MANAGE BLADDER WITH ASSISTANCE Description: STG Manage Bladder With min Assistance Outcome: Completed/Met   Problem: RH SAFETY Goal: RH STG ADHERE TO SAFETY PRECAUTIONS W/ASSISTANCE/DEVICE Description: STG Adhere to Safety Precautions With cues and reminders Assistance/Device. Outcome: Completed/Met Goal: RH STG DECREASED RISK OF FALL WITH ASSISTANCE Description: STG Decreased Risk of Fall With cues and reminders Assistance. Outcome: Completed/Met   Problem: RH PAIN MANAGEMENT Goal: RH STG PAIN MANAGED AT OR BELOW PT'S PAIN GOAL Description: Pain level less that 4 on scale of 0-10 Outcome: Completed/Met   Problem: RH KNOWLEDGE DEFICIT GENERAL Goal: RH STG INCREASE KNOWLEDGE OF SELF CARE AFTER HOSPITALIZATION Description: Pt will be able to adhere to medication regimen, dietary restrictions and lifestyle modification to prevent complications with mod I assist from family.  Outcome: Completed/Met

## 2019-12-25 ENCOUNTER — Telehealth: Payer: Self-pay | Admitting: *Deleted

## 2019-12-25 NOTE — Telephone Encounter (Signed)
Contacted Tessie Fass per social work discharge note. Discharge location: patient's home. Transitional Care call completed, appointment confirmed, address confirmed, new patient packet mailed    1. Are you/is patient experiencing any problems since coming home?  Anxiety Are there any questions regarding any aspect of care? No  2. Are there any questions regarding medications administration/dosing? No Are meds being taken as prescribed? Yes Patient should review meds with caller to confirm   3. Have there been any falls? One fall, no injury  4. Has Home Health been to the house and/or have they contacted you? Not yet, I explained to daughter the [facility] will be providing HHOT, HHPT.  She states they have not called yet.  If not, have you tried to contact them? Can we help you contact them?   5. Are bowels and bladder emptying properly?  constipated times 2 days, otherwise okay.  Are there any unexpected incontinence issues? If applicable, is patient following bowel/bladder programs?   6. Any fevers, problems with breathing, unexpected pain?  No  7. Are there any skin problems or new areas of breakdown?  No  8. Has the patient/family member arranged specialty MD follow up (ie cardiology/neurology/renal/surgical/etc)? Yes Can we help arrange?   9. Does the patient need any other services or support that we can help arrange? No  10. Are caregivers following through as expected in assisting the patient? Yes, daughter admits that maybe they discharged to soon. She says it is a little overwhelming.  11. Has the patient quit smoking, drinking alcohol, or using drugs as recommended? Patient is not doing these things. Daughter said that patient was caught taking a puff of a cigarette and they put a stop to that.

## 2020-01-03 ENCOUNTER — Encounter
Payer: Medicaid - Out of State | Attending: Physical Medicine and Rehabilitation | Admitting: Physical Medicine and Rehabilitation

## 2020-01-03 ENCOUNTER — Other Ambulatory Visit: Payer: Self-pay

## 2020-01-03 ENCOUNTER — Encounter: Payer: Self-pay | Admitting: Physical Medicine and Rehabilitation

## 2020-01-03 VITALS — BP 151/91 | HR 94 | Temp 97.7°F | Ht 62.0 in | Wt 116.8 lb

## 2020-01-03 DIAGNOSIS — N898 Other specified noninflammatory disorders of vagina: Secondary | ICD-10-CM | POA: Diagnosis present

## 2020-01-03 DIAGNOSIS — G936 Cerebral edema: Secondary | ICD-10-CM

## 2020-01-03 DIAGNOSIS — I69354 Hemiplegia and hemiparesis following cerebral infarction affecting left non-dominant side: Secondary | ICD-10-CM

## 2020-01-03 DIAGNOSIS — I69391 Dysphagia following cerebral infarction: Secondary | ICD-10-CM

## 2020-01-03 DIAGNOSIS — Z7409 Other reduced mobility: Secondary | ICD-10-CM

## 2020-01-03 DIAGNOSIS — I609 Nontraumatic subarachnoid hemorrhage, unspecified: Secondary | ICD-10-CM | POA: Diagnosis present

## 2020-01-03 DIAGNOSIS — Z789 Other specified health status: Secondary | ICD-10-CM | POA: Insufficient documentation

## 2020-01-03 MED ORDER — ALPRAZOLAM 1 MG PO TABS: 1.0000 mg | ORAL_TABLET | Freq: Four times a day (QID) | ORAL | 0 refills | Status: DC | PRN

## 2020-01-03 NOTE — Progress Notes (Signed)
Subjective:    Patient ID: Olivia Mann, female    DOB: 26-Mar-1956, 64 y.o.   MRN: [id number]  HPI  Olivia Mann is a 64 year old woman who presents for transitional care appointment after CIR admission for subarachnoid hemorrhage.   History is obtained from patient and chart review.  Patient had transitional care phone call on 2/8 that I have personally reviewed. During this call she expressed anxiety, one fall without injury, 2 days of constipation. Home Health had not yet called. Daughter has been a little overwhelmed caring for patient at home.   I have personally reviewed discharge medication list: Tylenol prn, Xanax 1mg  q6H prn, Keppra 1000mg  twice daily, Ritalin 5mg  BID, Nicotine patch, oxycodone 5mg  q6H prn, Pantoprazole 40mg  daily, Seroquel 25mg  daily prn, senna-docusate 2 tablets daily at bedtime.   I have personally reviewed the patient's discharge diagnoses and discharge summary which are included below:  Principal Problem:   SAH (subarachnoid hemorrhage) (HCC) Active Problems:   Hypokalemia DVT prophylaxis Seizure prophylaxis Dysphagia Hypertension Constipation Tobacco abuse  Olivia Mann was admitted to rehab 12/02/2019 for inpatient therapies to consist of PT, ST and OT at least three hours five days a week. Past admission physiatrist, therapy team and rehab RN have worked together to provide customized collaborative inpatient rehab.  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  Patient would follow-up neurosurgery.  Subcutaneous Lovenox had been initiated for DVT prophylaxis 11/22/2019 with no bleeding episodes.  Mood stabilization with the use of Seroquel 25 mg as needed as well as Xanax as needed.  Pain managed with oxycodone only as needed.  Patient was placed on Ritalin and titrated accordingly for attention and focus attention  had continued to improve.  Her diet has been advanced to a dysphagia #2 thin liquid diet nasogastric tube was discontinued.  Megace was initially added to help stimulate appetite.  She was receiving IV fluids at bedtime that had since been discontinued.  She continued on Keppra for seizure prophylaxis no seizure activity.  Patient did have a history of tobacco abuse she was weaned from a NicoDerm patch as well as receiving counsel regards to cessation of nicotine products.     Blood pressures were monitored on TID basis and controlled    She was receiving routine toileting   She has made gains during rehab stay and is attending therapies   She will continue to receive follow up therapies   after discharge   Rehab course: During patient's stay in rehab weekly team conferences were held to monitor patient's progress, set goals and discuss barriers to discharge. At admission, patient required moderate assist sit to stand, moderate assist sit to supine, moderate assist general transfers.  Max assist upper body bathing total assist lower body bathing max is upper body dressing total assist lower  She has not yet received home therapy but saw her neurosurgeon recently who prescribed it for her. She has been able to ambulate at home.   She was having some constipation initially. She has been taking the Senna-Docusate every night and has been having a BM every other day which is normal for her.   She has been having some vaginal discharge that is concerning her. It has a foul odor with a fishy smell. It occurs all the time and is not only present with urination.   She has been having some agitation at  home, especially since her daughter has been strict about her not having cigarettes. Her daughter requests a refill of her Xanax. She does not require refills of her other medications.   She has no other concerns at this time. She has established PCP follow-up.   Pain Inventory Average Pain 0 Pain  Right Now 6 My pain is dull  In the last 24 hours, has pain interfered with the following? General activity 0 Relation with others 0 Enjoyment of life 0 What TIME of day is your pain at its worst? night Sleep (in general) Fair  Pain is worse with: . Pain improves with: rest Relief from Meds: .  Mobility ability to climb steps?  no do you drive?  no use a wheelchair needs help with transfers  Function not employed: date last employed .  Neuro/Psych No problems in this area  Prior Studies Any changes since last visit?  no  Physicians involved in your care Any changes since last visit?  no   No family history on file. Social History   Socioeconomic History  . Marital status: Legally Separated    Spouse name: Not on file  . Number of children: Not on file  . Years of education: Not on file  . Highest education level: Not on file  Occupational History  . Not on file  Tobacco Use  . Smoking status: Current Every Day Smoker    Packs/day: 1.00    Types: Cigarettes  Substance and Sexual Activity  . Alcohol use: Not on file  . Drug use: Not on file  . Sexual activity: Not on file  Other Topics Concern  . Not on file  Social History Narrative  . Not on file   Social Determinants of Health   Financial Resource Strain:   . Difficulty of Paying Living Expenses: Not on file  Food Insecurity:   . Worried About Charity fundraiser in the Last Year: Not on file  . Ran Out of Food in the Last Year: Not on file  Transportation Needs:   . Lack of Transportation (Medical): Not on file  . Lack of Transportation (Non-Medical): Not on file  Physical Activity:   . Days of Exercise per Week: Not on file  . Minutes of Exercise per Session: Not on file  Stress:   . Feeling of Stress : Not on file  Social Connections:   . Frequency of Communication with Friends and Family: Not on file  . Frequency of Social Gatherings with Friends and Family: Not on file  . Attends  Religious Services: Not on file  . Active Member of Clubs or Organizations: Not on file  . Attends Archivist Meetings: Not on file  . Marital Status: Not on file   Past Surgical History:  Procedure Laterality Date  . CRANIOTOMY Right 11/19/2019   Procedure: CRANIOTOMY CLIPPING OF CAROTID ANEURYSM;  Surgeon: Consuella Lose, MD;  Location: [facility];  Service: Neurosurgery;  Laterality: Right;   No past medical history on file. BP (!) 151/91   Pulse 94   Temp 97.7 F (36.5 C)   Ht 5\' 2"  (1.575 m)   Wt 116 lb 12.8 oz (53 kg) Comment: in wheelchair. weighed without a wheelchair.  SpO2 98%   BMI 21.36 kg/m   Opioid Risk Score:   Fall Risk Score:  `1  Depression screen PHQ 2/9  Depression screen PHQ 2/9 01/03/2020  Decreased Interest 0  Down, Depressed, Hopeless 1  PHQ - 2 Score  1  Altered sleeping 0  Tired, decreased energy 0  Change in appetite 0  Feeling bad or failure about yourself  1  Trouble concentrating 0  Moving slowly or fidgety/restless 0  Suicidal thoughts 0  PHQ-9 Score 2  Difficult doing work/chores Not difficult at all    Review of Systems     Objective:   Physical Exam Constitutional well-developed well-nourished. Sitting in wheelchair. Daughter is at her side.  HEENT.  Craniotomy site clean and dry Eyes.  Pupils round and reactive to light no discharge without nystagmus Neck.  Supple nontender no JVD without thyromegaly Cardiac regular rate rhythm without any extra sounds or murmur heard GI.  Soft nontender positive bowel sounds without rebound Respiratory effort normal no respiratory distress without wheeze Musculoskeletal.  5/5 strength in lower extremities and RUE. LUE: 1/5 SA, 2/5 EE and EF, 0/5 WE and 1/5 hand grip.      Assessment & Plan:  Olivia Mann is a 64 year old woman who presents for transitional care appointment after CIR admission for subarachnoid hemorrhage.   Mobility: She has not yet received home PT and OT. Her daughter  dicussed this with neurosurgery who said they would order this for her. I provided daughter with my phone number to contact us in case she is still having issues with obtaining home therapies, which the patient absolutely needs, especially for her LUE strength. She has been ambulating in her home and playing with her grandkids which makes her very happy. Provided education regarding stroke recovery and prognosis.   Vaginal discharge: Her main complaint is foul smelling vaginal discharge that is present throughout the day and has a fishy odor. I have ordered a vaginal culture and will call her with the results once available and treat accordingly. Denies dysuria when she urinates.   Agitation: Daughter describes agitation at times especially since she is not able to smoke. Her daughter has been very strict with this and I commended her. I will refill Xanax. Advised that this medication should not be taken long term but is appropriate right now soon after hospital discharge.   Medications: I have reviewed medications with her and she does not require any other refills. I advised that she no longer needs the Protonix since she is note having GERD or GI symptoms. Her Keppra is being tapered by neurosurgery.   Headache: She has stopped taking Oxycodone. Advised that she can take Tylenol if needed for the headache.   She now has PCP follow-up. Advised that patient can follow-up PRN with [place] as we are very far for her to travel to from [place], [place]. Provided our phone number in case we can help in any way.

## 2020-01-03 NOTE — Progress Notes (Signed)
Recieved :  E-Prescribing Status: Transmission to pharmacy failed (01/03/2020 3:20 PM EST)  Attempted to call patient for alternative pharmacy, no answer, mailbox full

## 2020-01-04 ENCOUNTER — Telehealth: Payer: Self-pay

## 2020-01-04 DIAGNOSIS — R451 Restlessness and agitation: Secondary | ICD-10-CM

## 2020-01-04 NOTE — Telephone Encounter (Signed)
Did not go through to pharmacy due to computer error. 

## 2020-01-05 MED ORDER — ALPRAZOLAM 1 MG PO TABS: 1.0000 mg | ORAL_TABLET | Freq: Four times a day (QID) | ORAL | 0 refills | Status: AC | PRN

## 2021-11-30 IMAGING — CT CT ANGIO NECK
3 of 7 series · 10 of 36 positions shown · IV contrast (OMNI 350)
Comparison: None.

CLINICAL DATA: Intracranial hemorrhage.

EXAM:
CT ANGIOGRAPHY HEAD AND NECK
TECHNIQUE: Multidetector CT imaging of the head and neck was performed using
the standard protocol during bolus administration of intravenous
contrast. Multiplanar CT image reconstructions and MIPs were
obtained to evaluate the vascular anatomy. Carotid stenosis
measurements (when applicable) are obtained utilizing NASCET
criteria, using the distal internal carotid diameter as the
denominator.
CONTRAST:  75mL OMNIPAQUE IOHEXOL 350 MG/ML SOLN

[Series 5: cta neck · axial · 0.47mm/px · z∈[-280,-162]mm · 2 of 177 slices shown]
[im 59/177  soft-tissue]
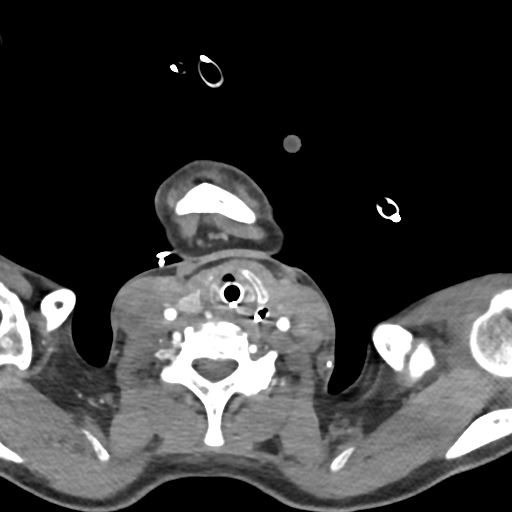
[im 118/177  soft-tissue]
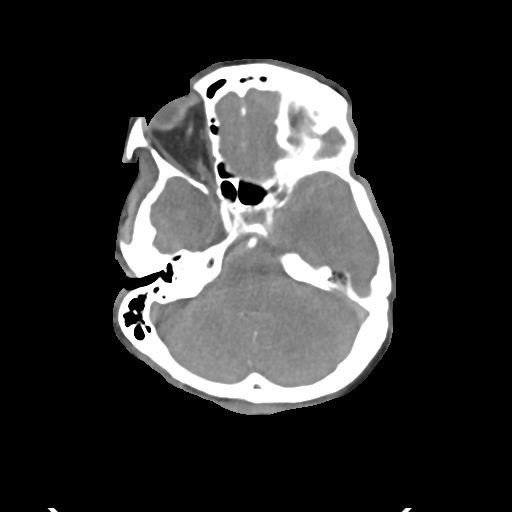

[Series 7: cta neck axial · axial · 0.39mm/px · z∈[-332,-83]mm · 6 of 352 slices shown]
[im 51/352  soft-tissue]
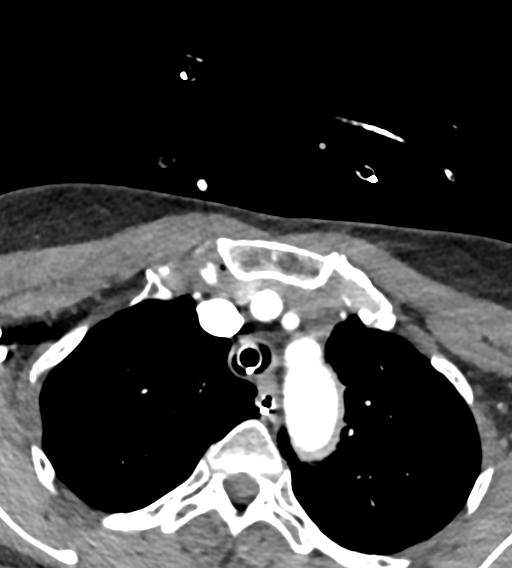
[im 101/352  bone]
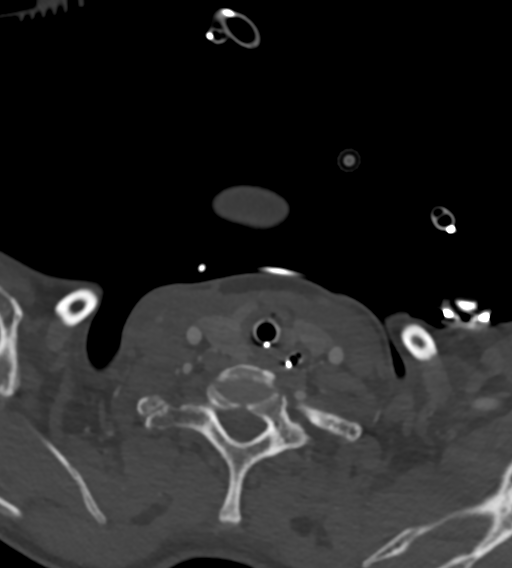
[im 151/352  soft-tissue]
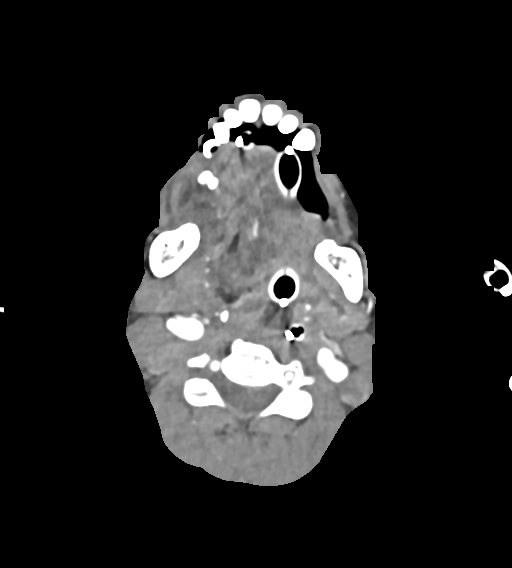
[im 201/352  bone]
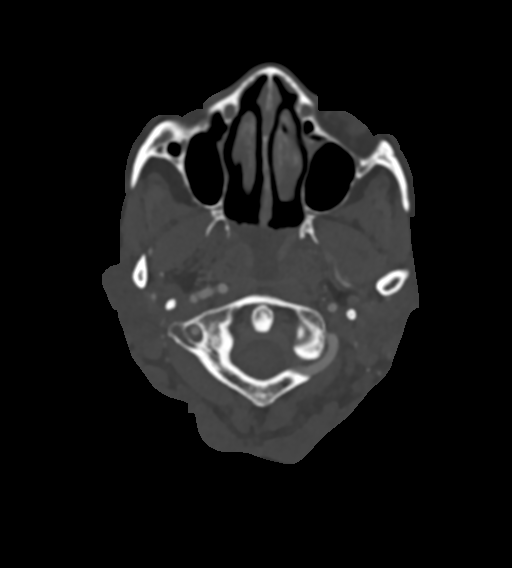
[im 251/352  soft-tissue]
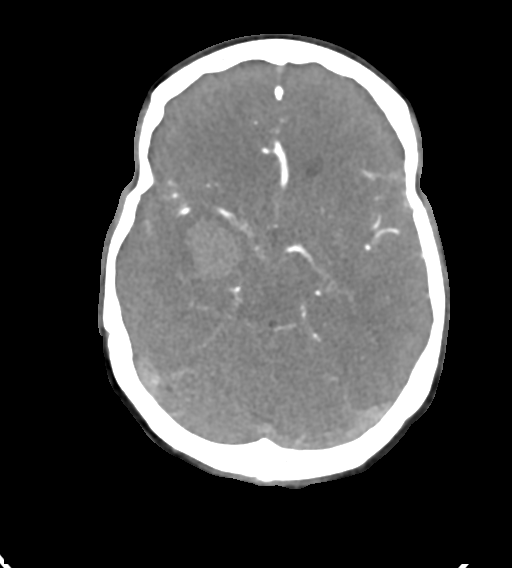
[im 301/352  bone]
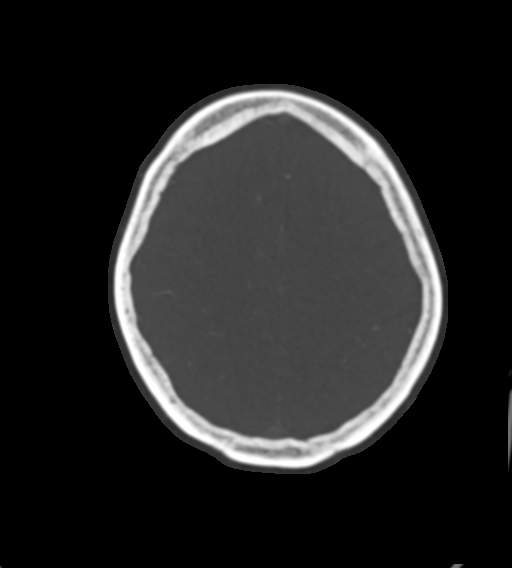

[Series 9: cta neck sagittal · sagittal · 0.46mm/px · 2 of 201 slices shown]
[im 58/201  soft-tissue]
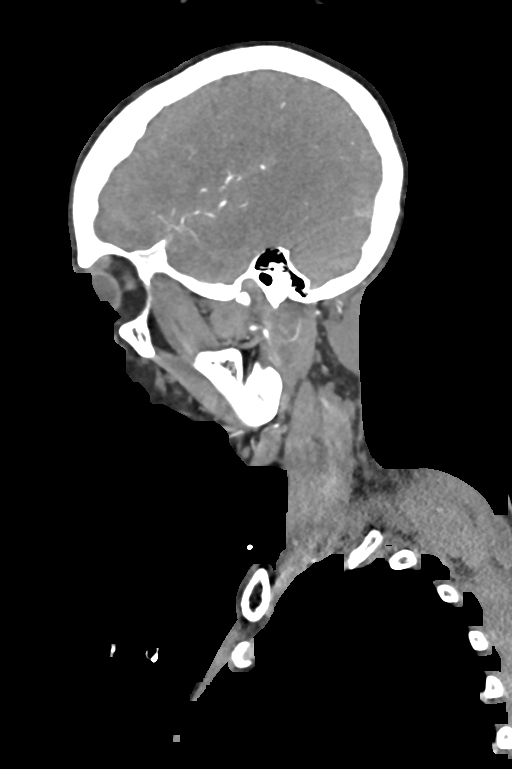
[im 144/201  soft-tissue]
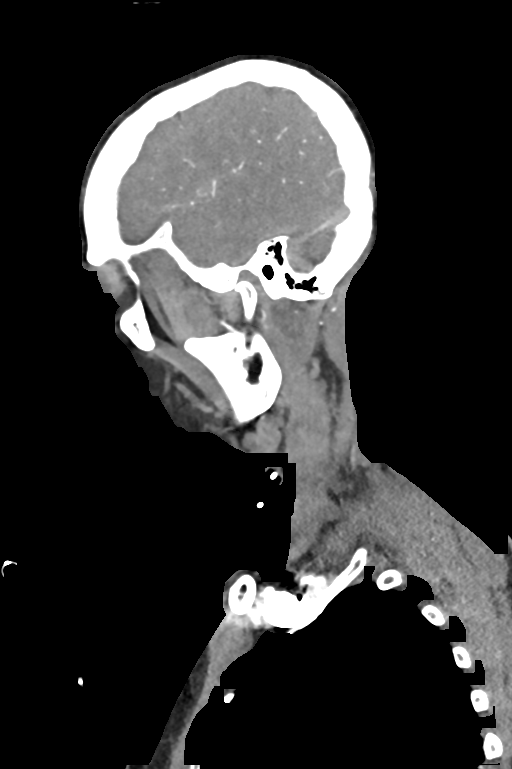

[10 of 36 positions shown; findings below may reference images not displayed]

FINDINGS: CTA NECK FINDINGS

Aortic arch: Normal

Right carotid system: Common carotid artery widely patent to the
bifurcation. No bifurcation atherosclerotic disease. Cervical ICA is
normal.

Left carotid system: Normal

Vertebral arteries: Normal

Skeleton: Mid cervical spondylosis.

Other neck: Normal.  Patient intubated.

Upper chest: Normal

Review of the MIP images confirms the above findings

CTA HEAD FINDINGS

Anterior circulation: Both internal carotid arteries are widely
patent through the skull base and siphon regions. Projecting
laterally from the supraclinoid ICA, 2 mm proximal to the
bifurcation, there is a slightly lobular aneurysm measuring up to 7
mm. There appears to be a wide mouth measuring up to 2.5 mm. Just
proximal to that, there is a 2 mm aneurysm or infundibulum. No
aneurysm seen in the middle cerebral artery branches. Azygos
anterior cerebral artery without an aneurysm. Left internal carotid
artery is widely patent. No aneurysm on the left. Left middle
cerebral artery appears normal.

Posterior circulation: Both vertebral arteries are widely patent to
the basilar. No basilar stenosis. Posterior circulation branch
vessels are normal. No sign of aneurysm.

Venous sinuses: Patent and normal.

Anatomic variants: Azygos anterior cerebral as noted above.

No sign of discernible active extravasation at this moment.

Review of the MIP images confirms the above findings
IMPRESSION: Lobular laterally projecting aneurysm from the supraclinoid ICA on
the right measuring up to 7 mm in length with a wide mouth measuring
up to 2.5 mm. The aneurysm is 2 to 3 mm proximal to the ICA
bifurcation. There is a 2 mm infundibulum or small PCOM aneurysm
just proximal to that.

## 2021-12-02 IMAGING — DX DG CHEST 1V PORT
1 series · 1 of 1 positions shown · non-contrast
Comparison: November 18, 2019

CLINICAL DATA: Hypoxia

EXAM:
PORTABLE CHEST 1 VIEW

[chest ap]
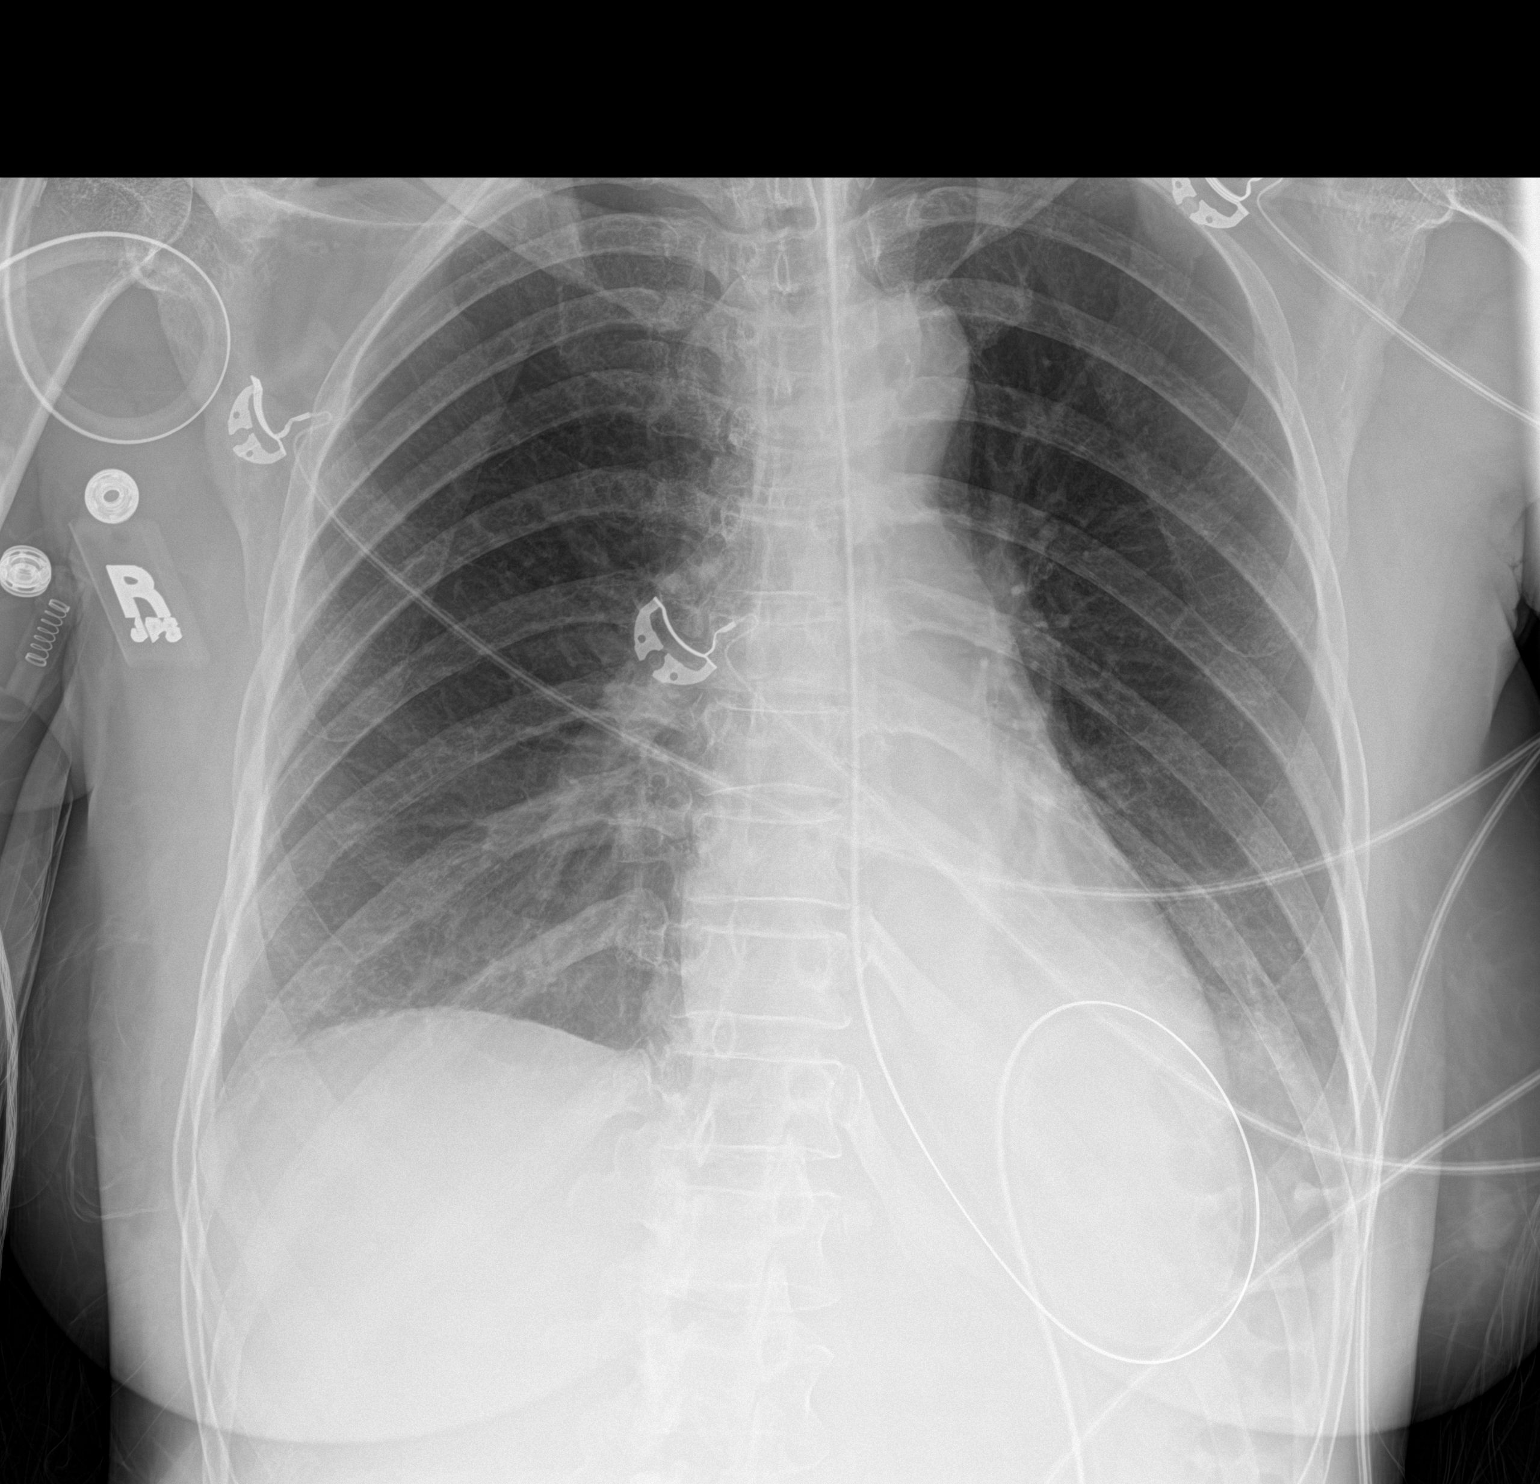

[1 of 1 positions shown; findings below may reference images not displayed]

FINDINGS: Endotracheal tube tip is 8.1 cm above the carina. Nasogastric tube
tip and side port are in stomach. No pneumothorax. There is airspace
consolidation in the left base with small left pleural effusion. The
right lung is clear. Heart size and pulmonary vascularity are
normal. No adenopathy. No bone lesions.
IMPRESSION: Tube positions as described without pneumothorax. It may be prudent
to consider advancing endotracheal tube 3-4 cm.

Left lower lobe consolidation with small left pleural effusion.
Suspect pneumonia, although aspiration could present similarly. Both
entities may be present concurrently.

Right lung clear.  Stable cardiac silhouette.
